# Patient Record
Sex: Male | Born: 1960 | Race: White | Hispanic: No | Marital: Married | Smoking: Former smoker
Health system: Southern US, Community
[De-identification: ages and names within clinical notes are randomized; demographics above are authoritative.]

## PROBLEM LIST (undated history)

## (undated) DIAGNOSIS — Z794 Long term (current) use of insulin: Principal | ICD-10-CM

## (undated) DIAGNOSIS — E1165 Type 2 diabetes mellitus with hyperglycemia: Principal | ICD-10-CM

## (undated) DIAGNOSIS — G4730 Sleep apnea, unspecified: Secondary | ICD-10-CM

## (undated) DIAGNOSIS — I10 Essential (primary) hypertension: Secondary | ICD-10-CM

## (undated) DIAGNOSIS — Z79899 Other long term (current) drug therapy: Secondary | ICD-10-CM

## (undated) DIAGNOSIS — E119 Type 2 diabetes mellitus without complications: Secondary | ICD-10-CM

## (undated) DIAGNOSIS — E785 Hyperlipidemia, unspecified: Secondary | ICD-10-CM

## (undated) DIAGNOSIS — Z951 Presence of aortocoronary bypass graft: Secondary | ICD-10-CM

## (undated) HISTORY — DX: Presence of aortocoronary bypass graft: Z95.1

## (undated) HISTORY — DX: Hyperlipidemia, unspecified: E78.5

## (undated) HISTORY — DX: Essential (primary) hypertension: I10

## (undated) HISTORY — DX: Other long term (current) drug therapy: Z79.899

## (undated) HISTORY — DX: Type 2 diabetes mellitus with hyperglycemia: E11.65

## (undated) HISTORY — DX: Long term (current) use of insulin: Z79.4

## (undated) HISTORY — DX: Sleep apnea, unspecified: G47.30

---

## 2002-10-06 HISTORY — PX: TONSILECTOMY/ADENOIDECTOMY WITH MYRINGOTOMY: SHX6125

## 2017-11-22 DIAGNOSIS — M545 Low back pain: Secondary | ICD-10-CM | POA: Diagnosis not present

## 2017-12-18 DIAGNOSIS — R21 Rash and other nonspecific skin eruption: Secondary | ICD-10-CM | POA: Diagnosis not present

## 2018-06-07 DIAGNOSIS — M5441 Lumbago with sciatica, right side: Secondary | ICD-10-CM | POA: Diagnosis not present

## 2018-06-13 ENCOUNTER — Other Ambulatory Visit: Payer: Self-pay

## 2018-06-13 ENCOUNTER — Emergency Department (HOSPITAL_COMMUNITY)
Admission: EM | Admit: 2018-06-13 | Discharge: 2018-06-13 | Disposition: A | Discharge status: Home / Self Care | Payer: BLUE CROSS/BLUE SHIELD | Attending: Emergency Medicine | Admitting: Emergency Medicine

## 2018-06-13 ENCOUNTER — Emergency Department (EMERGENCY_DEPARTMENT_HOSPITAL): Payer: BLUE CROSS/BLUE SHIELD

## 2018-06-13 ENCOUNTER — Emergency Department (HOSPITAL_COMMUNITY): Payer: BLUE CROSS/BLUE SHIELD

## 2018-06-13 ENCOUNTER — Encounter (HOSPITAL_COMMUNITY): Payer: Self-pay | Admitting: *Deleted

## 2018-06-13 DIAGNOSIS — F17210 Nicotine dependence, cigarettes, uncomplicated: Secondary | ICD-10-CM | POA: Insufficient documentation

## 2018-06-13 DIAGNOSIS — E1165 Type 2 diabetes mellitus with hyperglycemia: Secondary | ICD-10-CM | POA: Insufficient documentation

## 2018-06-13 DIAGNOSIS — M79604 Pain in right leg: Secondary | ICD-10-CM

## 2018-06-13 DIAGNOSIS — M5430 Sciatica, unspecified side: Secondary | ICD-10-CM | POA: Diagnosis not present

## 2018-06-13 DIAGNOSIS — R252 Cramp and spasm: Secondary | ICD-10-CM | POA: Diagnosis not present

## 2018-06-13 DIAGNOSIS — M5431 Sciatica, right side: Secondary | ICD-10-CM | POA: Diagnosis not present

## 2018-06-13 DIAGNOSIS — R739 Hyperglycemia, unspecified: Secondary | ICD-10-CM

## 2018-06-13 HISTORY — DX: Type 2 diabetes mellitus without complications: E11.9

## 2018-06-13 LAB — CBC
HEMATOCRIT: 47.6 % (ref 39.0–52.0)
HEMOGLOBIN: 16.3 g/dL (ref 13.0–17.0)
MCH: 29.5 pg (ref 26.0–34.0)
MCHC: 34.2 g/dL (ref 30.0–36.0)
MCV: 86.1 fL (ref 78.0–100.0)
Platelets: 237 10*3/uL (ref 150–400)
RBC: 5.53 MIL/uL (ref 4.22–5.81)
RDW: 11.8 % (ref 11.5–15.5)
WBC: 9 10*3/uL (ref 4.0–10.5)

## 2018-06-13 LAB — BASIC METABOLIC PANEL
ANION GAP: 15 (ref 5–15)
BUN: 21 mg/dL — ABNORMAL HIGH (ref 6–20)
CHLORIDE: 101 mmol/L (ref 98–111)
CO2: 23 mmol/L (ref 22–32)
Calcium: 9.3 mg/dL (ref 8.9–10.3)
Creatinine, Ser: 0.79 mg/dL (ref 0.61–1.24)
GFR calc non Af Amer: >60 mL/min (ref >60)
Glucose, Bld: 312 mg/dL — ABNORMAL HIGH (ref 70–99)
POTASSIUM: 3.9 mmol/L (ref 3.5–5.1)
Sodium: 139 mmol/L (ref 135–145)

## 2018-06-13 LAB — MAGNESIUM: Magnesium: 2 mg/dL (ref 1.7–2.4)

## 2018-06-13 LAB — PROTIME-INR
INR: 1
Prothrombin Time: 13.1 s (ref 11.4–15.2)

## 2018-06-13 LAB — TROPONIN I: Troponin I: <0.03 ng/mL

## 2018-06-13 MED ORDER — LORAZEPAM 2 MG/ML IJ SOLN
1.0000 mg | Freq: Once | INTRAMUSCULAR | Status: AC
  Administered 2018-06-13: 1 mg via INTRAVENOUS
  Filled 2018-06-13: qty 1

## 2018-06-13 MED ORDER — DEXAMETHASONE SODIUM PHOSPHATE 10 MG/ML IJ SOLN
10.0000 mg | Freq: Once | INTRAMUSCULAR | Status: AC
  Administered 2018-06-13: 10 mg via INTRAVENOUS
  Filled 2018-06-13: qty 1

## 2018-06-13 MED ORDER — METFORMIN HCL 500 MG PO TABS: 500.0000 mg | ORAL_TABLET | Freq: Two times a day (BID) | ORAL | 0 refills | Status: DC

## 2018-06-13 MED ORDER — ONDANSETRON HCL 4 MG/2ML IJ SOLN
4.0000 mg | Freq: Once | INTRAMUSCULAR | Status: AC
  Administered 2018-06-13: 4 mg via INTRAVENOUS
  Filled 2018-06-13: qty 2

## 2018-06-13 MED ORDER — PREDNISONE 10 MG (21) PO TBPK: 10.0000 mg | ORAL_TABLET | Freq: Every day | ORAL | 0 refills | Status: DC

## 2018-06-13 MED ORDER — MORPHINE SULFATE (PF) 4 MG/ML IV SOLN
4.0000 mg | Freq: Once | INTRAVENOUS | Status: AC
  Administered 2018-06-13: 4 mg via INTRAVENOUS
  Filled 2018-06-13: qty 1

## 2018-06-13 MED ORDER — KETOROLAC TROMETHAMINE 30 MG/ML IJ SOLN
30.0000 mg | Freq: Once | INTRAMUSCULAR | Status: AC
  Administered 2018-06-13: 30 mg via INTRAVENOUS
  Filled 2018-06-13: qty 1

## 2018-06-13 MED ORDER — DIAZEPAM 5 MG PO TABS: 5.0000 mg | ORAL_TABLET | Freq: Two times a day (BID) | ORAL | 0 refills | Status: DC

## 2018-06-13 MED ORDER — HYDROCODONE-ACETAMINOPHEN 5-325 MG PO TABS: 1.0000 | ORAL_TABLET | ORAL | 0 refills | Status: DC | PRN

## 2018-06-13 NOTE — ED Notes (Signed)
Family sitting in room, Pt. In Vascular Lab.

## 2018-06-13 NOTE — ED Notes (Signed)
ED Provider at bedside. 

## 2018-06-13 NOTE — ED Triage Notes (Signed)
The pt is c/o a severe rt leg cramp  In his rt leg for 2 days  More severe since 0200am  The pain goes into his lt chest

## 2018-06-13 NOTE — ED Provider Notes (Signed)
[facility] EMERGENCY DEPARTMENT Provider Note   CSN: [id number] Arrival date & time: 06/13/18  4163     History   Chief Complaint Chief Complaint  Patient presents with  . Leg Pain    HPI Barry Schmidt is a 57 y.o. male.  Pt presents to the ED today with right leg pain.  He has pain to his right leg for the past week.  He went to urgent care and was diagnosed with sciatica.  He was given muscle relaxants (flexeril, then baclofen), nsaids, and steroids.  The pt said sx are getting worse.  The pt was awake all night with cramping to his leg.  He was walking around and felt pain to his chest.  He denies any cp or sob.     Past Medical History:  Diagnosis Date  . Diabetes mellitus without complication (HCC)     There are no active problems to display for this patient.   History reviewed. No pertinent surgical history.      Home Medications    Prior to Admission medications   Medication Sig Start Date End Date Taking? Authorizing Provider  diazepam (VALIUM) 5 MG tablet Take 1 tablet (5 mg total) by mouth 2 (two) times daily. 06/13/18   Jacalyn Lefevre, MD  HYDROcodone-acetaminophen (NORCO/VICODIN) 5-325 MG tablet Take 1 tablet by mouth every 4 (four) hours as needed. 06/13/18   Jacalyn Lefevre, MD  metFORMIN (GLUCOPHAGE) 500 MG tablet Take 1 tablet (500 mg total) by mouth 2 (two) times daily with a meal. 06/13/18   Jacalyn Lefevre, MD  predniSONE (STERAPRED UNI-PAK 21 TAB) 10 MG (21) TBPK tablet Take 1 tablet (10 mg total) by mouth daily. Take 6 tabs by mouth daily  for 2 days, then 5 tabs for 2 days, then 4 tabs for 2 days, then 3 tabs for 2 days, 2 tabs for 2 days, then 1 tab by mouth daily for 2 days 06/13/18   Jacalyn Lefevre, MD    Family History No family history on file.  Social History Social History   Tobacco Use  . Smoking status: Current Every Day Smoker  . Smokeless tobacco: Never Used  Substance Use Topics  . Alcohol use: Yes  . Drug use:  Not on file     Allergies   Penicillins   Review of Systems Review of Systems  Cardiovascular: Positive for chest pain.  Musculoskeletal:       Right leg cramping  All other systems reviewed and are negative.    Physical Exam Updated Vital Signs BP 121/80   Pulse 64   Resp 15   Ht 5' 8.5" (1.74 m)   Wt 99.8 kg   SpO2 97%   BMI 32.96 kg/m   Physical Exam  Constitutional: He is oriented to person, place, and time. He appears well-developed and well-nourished.  HENT:  Head: Normocephalic and atraumatic.  Right Ear: External ear normal.  Left Ear: External ear normal.  Nose: Nose normal.  Mouth/Throat: Oropharynx is clear and moist.  Eyes: Pupils are equal, round, and reactive to light. Conjunctivae and EOM are normal.  Neck: Normal range of motion. Neck supple.  Cardiovascular: Normal rate, regular rhythm, normal heart sounds and intact distal pulses.  Pulmonary/Chest: Effort normal and breath sounds normal.  Abdominal: Soft. Bowel sounds are normal.  Musculoskeletal: Normal range of motion.  Neurological: He is alert and oriented to person, place, and time.  Skin: Skin is warm. Capillary refill takes less than 2 seconds.  Psychiatric: He has a normal mood and affect. His behavior is normal. Judgment and thought content normal.  Nursing note and vitals reviewed.    ED Treatments / Results  Labs (all labs ordered are listed, but only abnormal results are displayed) Labs Reviewed  BASIC METABOLIC PANEL - Abnormal; Notable for the following components:      Result Value   Glucose, Bld 312 (*)    BUN 21 (*)    All other components within normal limits  CBC  TROPONIN I  PROTIME-INR  MAGNESIUM    EKG EKG Interpretation  Date/Time:  "Sunday June 13 2018 06:50:10 EDT Ventricular Rate:  75 PR Interval:  138 QRS Duration: 90 QT Interval:  414 QTC Calculation: 462 R Axis:   24 Text Interpretation:  Normal sinus rhythm Normal ECG No old tracing to  compare Confirmed by Isidra Mings ([id number]) on 06/13/2018 7:02:24 AM   Radiology Dg Chest 2 View  Result Date: 06/13/2018 CLINICAL DATA:  Leg cramps. EXAM: CHEST - 2 VIEW COMPARISON:  None. FINDINGS: The heart, hila, mediastinum, lungs, and pleura are normal. No acute abnormalities identified. IMPRESSION: No active cardiopulmonary disease. Electronically Signed   By: David  Williams III M.D   On: 06/13/2018 07:23    Right lower extremity venous duplex completed.    Preliminary report:  There is no DVT or SVT noted in the right lower extremity.    Procedures Procedures (including critical care time)  Medications Ordered in ED Medications  LORazepam (ATIVAN) injection 1 mg (has no administration in time range)  morphine 4 MG/ML injection 4 mg (has no administration in time range)  ketorolac (TORADOL) 30 MG/ML injection 30 mg (has no administration in time range)  morphine 4 MG/ML injection 4 mg (4 mg Intravenous Given 06/13/18 0809)  ondansetron (ZOFRAN) injection 4 mg (4 mg Intravenous Given 06/13/18 0809)  dexamethasone (DECADRON) injection 10 mg (10 mg Intravenous Given 06/13/18 0809)  LORazepam (ATIVAN) injection 1 mg (1 mg Intravenous Given 06/13/18 0809)     Initial Impression / Assessment and Plan / ED Course  I have reviewed the triage vital signs and the nursing notes.  Pertinent labs & imaging results that were available during my care of the patient were reviewed by me and considered in my medical decision making (see chart for details).    Sx c/w sciatica.  No DVT.   Nl ekg and neg troponin.  Very atypical cp.  Heart score of 2.  Pain has improved. BS is elevated.  He has not taken any diabetic meds for 6 years since he lost weight.  He has not had his blood sugar checked recently.  It is probably elevated b/c he's been on steroids.  I will put him back on metformin while he's on steroids.  He is given the number of pcp where he lives.  Return if worse.  Pt stable for d/c  home.  Final Clinical Impressions(s) / ED Diagnoses   Final diagnoses:  Acute sciatica  Hyperglycemia    ED Discharge Orders         Ordered    HYDROcodone-acetaminophen (NORCO/VICODIN) 5-325 MG tablet  Every 4 hours PRN     06/13/18 1035    metFORMIN (GLUCOPHAGE) 500 MG tablet  2 times daily with meals     06/13/18 1035    predniSONE (STERAPRED UNI-PAK 21 TAB) 10 MG (21) TBPK tablet  Daily     09" /08/19 1035    diazepam (VALIUM) 5 MG tablet  2 times  daily     06/13/18 1035           Jacalyn Lefevre, MD 06/13/18 1037

## 2018-06-13 NOTE — Progress Notes (Signed)
VASCULAR LAB PRELIMINARY  PRELIMINARY  PRELIMINARY  PRELIMINARY  Right lower extremity venous duplex completed.    Preliminary report:  There is no DVT or SVT noted in the right lower extremity.    Called results to Dr. Trude Mcburney, [facility], RVT 06/13/2018, 9:19 AM

## 2018-06-13 NOTE — Discharge Instructions (Signed)
Stop current meds for pain.

## 2018-06-13 NOTE — ED Notes (Signed)
Patient transported to X-ray 

## 2018-06-23 ENCOUNTER — Telehealth: Payer: Self-pay | Admitting: Emergency Medicine

## 2018-06-23 ENCOUNTER — Other Ambulatory Visit: Payer: Self-pay

## 2018-06-23 ENCOUNTER — Ambulatory Visit (INDEPENDENT_AMBULATORY_CARE_PROVIDER_SITE_OTHER): Payer: BLUE CROSS/BLUE SHIELD | Admitting: Family Medicine

## 2018-06-23 ENCOUNTER — Encounter: Payer: Self-pay | Admitting: Family Medicine

## 2018-06-23 VITALS — BP 130/70 | HR 71 | Temp 98.0°F | Ht 68.5 in | Wt 193.8 lb

## 2018-06-23 DIAGNOSIS — E1165 Type 2 diabetes mellitus with hyperglycemia: Secondary | ICD-10-CM | POA: Diagnosis not present

## 2018-06-23 DIAGNOSIS — Z23 Encounter for immunization: Secondary | ICD-10-CM

## 2018-06-23 DIAGNOSIS — IMO0001 Reserved for inherently not codable concepts without codable children: Secondary | ICD-10-CM

## 2018-06-23 DIAGNOSIS — E1144 Type 2 diabetes mellitus with diabetic amyotrophy: Secondary | ICD-10-CM | POA: Diagnosis not present

## 2018-06-23 DIAGNOSIS — Z794 Long term (current) use of insulin: Secondary | ICD-10-CM

## 2018-06-23 DIAGNOSIS — R739 Hyperglycemia, unspecified: Secondary | ICD-10-CM

## 2018-06-23 DIAGNOSIS — M5416 Radiculopathy, lumbar region: Secondary | ICD-10-CM | POA: Diagnosis not present

## 2018-06-23 HISTORY — DX: Reserved for inherently not codable concepts without codable children: IMO0001

## 2018-06-23 LAB — POCT GLYCOSYLATED HEMOGLOBIN (HGB A1C): Hemoglobin A1C: 12.1 % — AB (ref 4.0–5.6)

## 2018-06-23 LAB — COMPREHENSIVE METABOLIC PANEL
ALK PHOS: 100 U/L (ref 39–117)
ALT: 35 U/L (ref 0–53)
AST: 15 U/L (ref 0–37)
Albumin: 4.1 g/dL (ref 3.5–5.2)
BUN: 20 mg/dL (ref 6–23)
CO2: 28 meq/L (ref 19–32)
Calcium: 9.3 mg/dL (ref 8.4–10.5)
Chloride: 97 mEq/L (ref 96–112)
Creatinine, Ser: 0.77 mg/dL (ref 0.40–1.50)
GFR: 111.25 mL/min (ref >60.00)
GLUCOSE: 532 mg/dL — AB (ref 70–99)
Potassium: 4.4 mEq/L (ref 3.5–5.1)
SODIUM: 132 meq/L — AB (ref 135–145)
TOTAL PROTEIN: 6.5 g/dL (ref 6.0–8.3)
Total Bilirubin: 0.5 mg/dL (ref 0.2–1.2)

## 2018-06-23 LAB — LIPID PANEL
Cholesterol: 185 mg/dL (ref 0–200)
HDL: 60.9 mg/dL
Total CHOL/HDL Ratio: 3
Triglycerides: 406 mg/dL — ABNORMAL HIGH (ref 0.0–149.0)

## 2018-06-23 LAB — LDL CHOLESTEROL, DIRECT: LDL DIRECT: 90 mg/dL

## 2018-06-23 LAB — MICROALBUMIN / CREATININE URINE RATIO
Creatinine,U: 21.1 mg/dL
Microalb Creat Ratio: 3.3 mg/g (ref 0.0–30.0)
Microalb, Ur: <0.7 mg/dL (ref 0.0–1.9)

## 2018-06-23 LAB — TSH: TSH: 2.82 u[IU]/mL (ref 0.35–4.50)

## 2018-06-23 MED ORDER — BLOOD GLUCOSE METER KIT: PACK | 0 refills | Status: DC

## 2018-06-23 MED ORDER — METFORMIN HCL 1000 MG PO TABS: 1000.0000 mg | ORAL_TABLET | Freq: Two times a day (BID) | ORAL | 3 refills | Status: DC

## 2018-06-23 MED ORDER — ONETOUCH ULTRA 2 W/DEVICE KIT: 1.0000 | PACK | Freq: Two times a day (BID) | 0 refills | Status: AC

## 2018-06-23 MED ORDER — GABAPENTIN 300 MG PO CAPS: 300.0000 mg | ORAL_CAPSULE | Freq: Three times a day (TID) | ORAL | 0 refills | Status: DC

## 2018-06-23 MED ORDER — GLUCOSE BLOOD VI STRP: ORAL_STRIP | 12 refills | Status: DC

## 2018-06-23 MED ORDER — ONETOUCH ULTRASOFT LANCETS MISC: 12 refills | Status: AC

## 2018-06-23 NOTE — Telephone Encounter (Signed)
Patient just established care with Dr. Mardelle MatteAndy today with A1C noted to be > 12. Metformin doubled to 1000 mg BID. He has just completed a steroid taper given by ER which is likely cause of such high elevation of sugar currently. Care as noted below with ER precautions reviewed. He is to call tomorrow AM to report fasting blood sugar and for further instruction.

## 2018-06-23 NOTE — Progress Notes (Signed)
Subjective  CC:  Chief Complaint  Patient presents with  . Establish Care    Not seen a PCP in 5 years, relocated here from [place]  . Leg Pain    Right leg pain, doing down to the ankle x 2 weeks    HPI: Barry Schmidt is a 57 y.o. male who presents to [place] at [facility] today to establish care with me as a new patient.  Recently evaluated at [facility] and ED for "sciatica". I reviewed ER notes and labs He has the following concerns or needs:  Left anterior thigh pain for the last several weeks.  He has been hydrocodone, Valium, prednisone and a muscle relaxer.  His symptoms are mildly improved but not significantly so.  Pain is described as burning but sometimes radiating down the ankle.  He denies weakness.  He denies buttock pain or back pain.  He has history of sciatica but this does feel different.  He does not feel that the prednisone helped.  Health maintenance, overdue.  Last time evaluated by a primary care doctor was over 5 years ago.  He does report that at that time he was taken off his diabetes medicines because his diabetes is been well controlled.  He had lost close to 100 pounds.  He has not been back to be rechecked.  He specifically denies symptoms of hyperglycemia including blurred vision, polyuria, polydipsia although he will have nocturia if he drinks too much at night.  He avoids sweet beverages but does admit to liking desserts and sweets.  He is not following a diabetic diet.  His weight is stable.   Assessment  1. Uncontrolled diabetes mellitus without complication, with long-term current use of insulin ([place])   2. Hyperglycemia   3. Left lumbar radiculopathy   4. 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 Need for influenza vaccination      Plan   Uncontrolled diabetes type 2: Patient prefers to work on diet.  He would like to try metformin 1000 twice daily only.  Will start checking sugars twice daily, follow low sugar, diabetic  diet and recheck in 3 to 4 weeks.  Flu shot updated today.  Need old records to see if he is had his Pneumovax.  Will check labs and urine testing today.  Will need eye exam.  Start education regarding uncontrolled diabetes and goals for control.  See after visit summary  Anterior thigh pain could be consistent with a myotrophy neuropathy due to diabetes.  Also could be due to lumbar radiculopathy.  He does have an appointment with orthopedic this Friday.  I recommend keeping that.  Start Neurontin.  May warrant lumbar MRI.  Health maintenance: We will need to get diabetes under better control, then will work on other preventive screens that are due.  Patient understands and agrees with current care plan  Follow up:  Return in about 3 months (around 09/22/2018). Orders Placed This Encounter  Procedures  . Flu Vaccine QUAD 36+ mos IM  . Comprehensive metabolic panel  . Lipid panel  . TSH  . Microalbumin / creatinine urine ratio  . LDL cholesterol, direct  . POCT glycosylated hemoglobin (Hb A1C)   Meds ordered this encounter  Medications  . gabapentin (NEURONTIN) 300 MG capsule    Sig: Take 1 capsule (300 mg total) by mouth 3 (three) times daily.    Dispense:  90 capsule    Refill:  0  . metFORMIN (GLUCOPHAGE)  1000 MG tablet    Sig: Take 1 tablet (1,000 mg total) by mouth 2 (two) times daily with a meal.    Dispense:  180 tablet    Refill:  3  . DISCONTD: blood glucose meter kit and supplies    Sig: Dispense based on patient and insurance preference. Use up to twice times daily as directed. (FOR ICD-10 E10.9, E11.9).    Dispense:  1 each    Refill:  0    Order Specific Question:   Number of strips    Answer:   100    Order Specific Question:   Number of lancets    Answer:   100  . DISCONTD: blood glucose meter kit and supplies    Sig: Dispense based on patient and insurance preference. Use up to four times daily as directed. (FOR ICD-10 E10.9, E11.9).    Dispense:  1 each     Refill:  0    Order Specific Question:   Number of strips    Answer:   100    Order Specific Question:   Number of lancets    Answer:   100  . Blood Glucose Monitoring Suppl (ONE TOUCH ULTRA 2) w/Device KIT    Sig: 1 kit by Does not apply route 2 (two) times daily.    Dispense:  1 each    Refill:  0  . Lancets (ONETOUCH ULTRASOFT) lancets    Sig: Use as instructed    Dispense:  100 each    Refill:  12  . glucose blood (ONE TOUCH ULTRA TEST) test strip    Sig: Use as instructed    Dispense:  100 each    Refill:  12     Depression screen PHQ 2/9 06/23/2018  Decreased Interest 0  Down, Depressed, Hopeless 0  PHQ - 2 Score 0    We updated and reviewed the patient's past history in detail and it is documented below.  Patient Active Problem List   Diagnosis Date Noted  . Uncontrolled diabetes mellitus without complication, with long-term current use of insulin ([place]) 06/23/2018    Diagnosed in 2007; was morbidly obese at that time 260 pounds. He lost weight and did well.     Health Maintenance  Topic Date Due  . Hepatitis C Screening  Apr 09, 1961  . PNEUMOCOCCAL POLYSACCHARIDE VACCINE AGE 68-64 HIGH RISK  11/24/1964  . OPHTHALMOLOGY EXAM  11/24/1972  . HIV Screening  11/24/1977  . INFLUENZA VACCINE  05/06/2018  . HEMOGLOBIN A1C  12/22/2018  . FOOT EXAM  06/24/2019  . URINE MICROALBUMIN  06/24/2019  . COLONOSCOPY  10/06/2022  . TETANUS/TDAP  10/06/2022   Immunization History  Administered Date(s) Administered  . Tdap 10/06/2012   Current Meds  Medication Sig  . predniSONE (STERAPRED UNI-PAK 21 TAB) 10 MG (21) TBPK tablet Take 1 tablet (10 mg total) by mouth daily. Take 6 tabs by mouth daily  for 2 days, then 5 tabs for 2 days, then 4 tabs for 2 days, then 3 tabs for 2 days, 2 tabs for 2 days, then 1 tab by mouth daily for 2 days  . [DISCONTINUED] metFORMIN (GLUCOPHAGE) 500 MG tablet Take 1 tablet (500 mg total) by mouth 2 (two) times daily with a meal.     Allergies: Patient is allergic to penicillins. Past Medical History Patient  has a past medical history of Diabetes mellitus without complication ([place]) and Uncontrolled diabetes mellitus without complication, with long-term current use of insulin ([place]) (06/23/2018).  Past Surgical History Patient  has no past surgical history on file. Family History: Patient family history includes Diabetes in his brother, brother, and sister; Diabetes type I in his father; Healthy in his son and son; Heart attack in his father; Heart disease in his mother; Hypertension in his mother. Social History:  Patient  reports that he has been smoking cigarettes. He has never used smokeless tobacco. He reports that he drinks alcohol. He reports that he does not use drugs.  Review of Systems: Constitutional: negative for fever or malaise Ophthalmic: negative for photophobia, double vision or loss of vision Cardiovascular: negative for chest pain, dyspnea on exertion, or new LE swelling Respiratory: negative for SOB or persistent cough Gastrointestinal: negative for abdominal pain, change in bowel habits or melena Genitourinary: negative for dysuria or gross hematuria Musculoskeletal: negative for new gait disturbance or muscular weakness Integumentary: negative for new or persistent rashes Neurological: negative for TIA or stroke symptoms Psychiatric: negative for SI or delusions Allergic/Immunologic: negative for hives  Patient Care Team    Relationship Specialty Notifications Start End  [name], MD PCP - General Family Medicine  06/23/18     Objective  Vitals: BP 130/70   Pulse 71   Temp 98 F (36.7 C)   Ht 5' 8.5" (1.74 m)   Wt 193 lb 12.8 oz (87.9 kg)   SpO2 98%   BMI 29.04 kg/m  General:  Well developed, well nourished, no acute distress  Psych:  Alert and oriented,normal mood and affect HEENT:  Normocephalic, atraumatic, non-icteric sclera, PERRL, oropharynx is without mass or exudate,  supple neck without adenopathy, mass or thyromegaly Cardiovascular:  RRR without gallop, rub or murmur, nondisplaced PMI Respiratory:  Good breath sounds bilaterally, CTAB with normal respiratory effort Gastrointestinal: normal bowel sounds, soft, non-tender, no noted masses. No HSM MSK: no deformities, contusions. Joints are without erythema or swelling No sciatic notch tenderness, normal gait, no back tenderness Skin:  Warm, no rashes or suspicious lesions noted Neurologic:    Mental status is normal.  Negative straight leg raise bilaterally, right knee DTR, left knee DTR +2, normal strength throughout.  Normal gait   Commons side effects, risks, benefits, and alternatives for medications and treatment plan prescribed today were discussed, and the patient expressed understanding of the given instructions. Patient is instructed to call or message via MyChart if he/she has any questions or concerns regarding our treatment plan. No barriers to understanding were identified. We discussed Red Flag symptoms and signs in detail. Patient expressed understanding regarding what to do in case of urgent or emergency type symptoms.   Medication list was reconciled, printed and provided to the patient in AVS. Patient instructions and summary information was reviewed with the patient as documented in the AVS. This note was prepared with assistance of Dragon voice recognition software. Occasional wrong-word or sound-a-like substitutions may have occurred due to the inherent limitations of voice recognition software

## 2018-06-23 NOTE — Telephone Encounter (Addendum)
CRITICAL VALUE STICKER  CRITICAL VALUE: Glucose 532  RECEIVER (on-site recipient of call): Kathi SimpersAmy Madalina Rosman, LPN   DATE & TIME NOTIFIED: 06/23/2018- 3:35pm  MESSENGER (representative from lab): [facility]   MD NOTIFIED: Malva Coganody Martin, PA-C   TIME OF NOTIFICATION:3:45pm  RESPONSE: Spoke with Malva Coganody Martin, states for Patient to check Glucose, I spoke with Patient and advised him to check Blood Glucose, he is at the pharmacy picking up supplies and he will check and call back at [phone].   Spoke with Patient, he reports that his sugar was 476, I advised patient per Malva Coganody Martin to Minimize Carbs, Push Fluids, Monitor BS throughout the night and if Blood Sugars are consitantly at 500. Go to ER for Fluids. Patient advised to call tomorrow morning with Fasting Blood Sugars.  Patient verbalized understanding  Kathi SimpersAmy Keiera Strathman,  LPN

## 2018-06-23 NOTE — Patient Instructions (Addendum)
Please return in 4 weeks to recheck diabetes . Please check your sugars every morning, fasting and then 2 hours AFTER lunch or dinner. (so check twice a day). Fasting sugar goal is < 120 2 hours after eating goal is < 160. We want your a1c < 7.0 Increase your metformin to 1056m daily.  Please start following a diabetic diet.   Please have the orthopedic doctor send me his notes.   It was a pleasure meeting you today! Thank you for choosing [facility] meet your healthcare needs! I truly look forward to working with you. If you have any questions or concerns, please send me a message via Mychart or call the office at [phone]   Diabetes Mellitus and Standards of Medical Care Managing diabetes (diabetes mellitus) can be complicated. Your diabetes treatment may be managed by a team of health care providers, including:  A diet and nutrition specialist (registered dietitian).  A nurse.  A certified diabetes educator (CDE).  A diabetes specialist (endocrinologist).  An eye doctor.  A primary care provider.  A dentist.  Your health care providers follow a schedule in order to help you get the best quality of care. The following schedule is a general guideline for your diabetes management plan. Your health care providers may also give you more specific instructions. HbA1c ( hemoglobin A1c) test This test provides information about blood sugar (glucose) control over the previous 2-3 months. It is used to check whether your diabetes management plan needs to be adjusted.  If you are meeting your treatment goals, this test is done at least 2 times a year.  If you are not meeting treatment goals or if your treatment goals have changed, this test is done 4 times a year.  Blood pressure test  This test is done at every routine medical visit. For most people, the goal is less than 130/80. Ask your health care provider what your goal blood pressure should be. Dental and eye exams  Visit  your dentist two times a year.  If you have type 1 diabetes, get an eye exam 3-5 years after you are diagnosed, and then once a year after your first exam. ? If you were diagnosed with type 1 diabetes as a child, get an eye exam when you are age 6455or older and have had diabetes for 3-5 years. After the first exam, you should get an eye exam once a year.  If you have type 2 diabetes, have an eye exam as soon as you are diagnosed, and then once a year after your first exam. Foot care exam  Visual foot exams are done at every routine medical visit. The exams check for cuts, bruises, redness, blisters, sores, or other problems with the feet.  A complete foot exam is done by your health care provider once a year. This exam includes an inspection of the structure and skin of your feet, and a check of the pulses and sensation in your feet. ? Type 1 diabetes: Get your first exam 3-5 years after diagnosis. ? Type 2 diabetes: Get your first exam as soon as you are diagnosed.  Check your feet every day for cuts, bruises, redness, blisters, or sores. If you have any of these or other problems that are not healing, contact your health care provider. Kidney function test ( urine microalbumin)  This test is done once a year. ? Type 1 diabetes: Get your first test 5 years after diagnosis. ? Type 2 diabetes: Get your  first test as soon as you are diagnosed.  If you have chronic kidney disease (CKD), get a serum creatinine and estimated glomerular filtration rate (eGFR) test once a year. Lipid profile (cholesterol, HDL, LDL, triglycerides)  This test should be done when you are diagnosed with diabetes, and every 5 years after the first test. If you are on medicines to lower your cholesterol, you may need to get this test done every year. ? The goal for LDL is less than 100 mg/dL (5.5 mmol/L). If you are at high risk, the goal is less than 70 mg/dL (3.9 mmol/L). ? The goal for HDL is 40 mg/dL (2.2 mmol/L)  for men and 50 mg/dL(2.8 mmol/L) for women. An HDL cholesterol of 60 mg/dL (3.3 mmol/L) or higher gives some protection against heart disease. ? The goal for triglycerides is less than 150 mg/dL (8.3 mmol/L). Immunizations  The yearly flu (influenza) vaccine is recommended for everyone 6 months or older who has diabetes.  The pneumonia (pneumococcal) vaccine is recommended for everyone 2 years or older who has diabetes. If you are 33 or older, you may get the pneumonia vaccine as a series of two separate shots.  The hepatitis B vaccine is recommended for adults shortly after they have been diagnosed with diabetes.  The Tdap (tetanus, diphtheria, and pertussis) vaccine should be given: ? According to normal childhood vaccination schedules, for children. ? Every 10 years, for adults who have diabetes.  The shingles vaccine is recommended for people who have had chicken pox and are 50 years or older. Mental and emotional health  Screening for symptoms of eating disorders, anxiety, and depression is recommended at the time of diagnosis and afterward as needed. If your screening shows that you have symptoms (you have a positive screening result), you may need further evaluation and be referred to a mental health care provider. Diabetes self-management education  Education about how to manage your diabetes is recommended at diagnosis and ongoing as needed. Treatment plan  Your treatment plan will be reviewed at every medical visit. Summary  Managing diabetes (diabetes mellitus) can be complicated. Your diabetes treatment may be managed by a team of health care providers.  Your health care providers follow a schedule in order to help you get the best quality of care.  Standards of care including having regular physical exams, blood tests, blood pressure monitoring, immunizations, screening tests, and education about how to manage your diabetes.  Your health care providers may also give you  more specific instructions based on your individual health. This information is not intended to replace advice given to you by your health care provider. Make sure you discuss any questions you have with your health care provider. Document Released: 07/20/2009 Document Revised: 06/20/2016 Document Reviewed: 06/20/2016 Elsevier Interactive Patient Education  2018 [employer].   Diabetes Mellitus and Nutrition When you have diabetes (diabetes mellitus), it is very important to have healthy eating habits because your blood sugar (glucose) levels are greatly affected by what you eat and drink. Eating healthy foods in the appropriate amounts, at about the same times every day, can help you:  Control your blood glucose.  Lower your risk of heart disease.  Improve your blood pressure.  Reach or maintain a healthy weight.  Every person with diabetes is different, and each person has different needs for a meal plan. Your health care provider may recommend that you work with a diet and nutrition specialist (dietitian) to make a meal plan that is best  for you. Your meal plan may vary depending on factors such as:  The calories you need.  The medicines you take.  Your weight.  Your blood glucose, blood pressure, and cholesterol levels.  Your activity level.  Other health conditions you have, such as heart or kidney disease.  How do carbohydrates affect me? Carbohydrates affect your blood glucose level more than any other type of food. Eating carbohydrates naturally increases the amount of glucose in your blood. Carbohydrate counting is a method for keeping track of how many carbohydrates you eat. Counting carbohydrates is important to keep your blood glucose at a healthy level, especially if you use insulin or take certain oral diabetes medicines. It is important to know how many carbohydrates you can safely have in each meal. This is different for every person. Your dietitian can help you  calculate how many carbohydrates you should have at each meal and for snack. Foods that contain carbohydrates include:  Bread, cereal, rice, pasta, and crackers.  Potatoes and corn.  Peas, beans, and lentils.  Milk and yogurt.  Fruit and juice.  Desserts, such as cakes, cookies, ice cream, and candy.  How does alcohol affect me? Alcohol can cause a sudden decrease in blood glucose (hypoglycemia), especially if you use insulin or take certain oral diabetes medicines. Hypoglycemia can be a life-threatening condition. Symptoms of hypoglycemia (sleepiness, dizziness, and confusion) are similar to symptoms of having too much alcohol. If your health care provider says that alcohol is safe for you, follow these guidelines:  Limit alcohol intake to no more than 1 drink per day for nonpregnant women and 2 drinks per day for men. One drink equals 12 oz of beer, 5 oz of wine, or 1 oz of hard liquor.  Do not drink on an empty stomach.  Keep yourself hydrated with water, diet soda, or unsweetened iced tea.  Keep in mind that regular soda, juice, and other mixers may contain a lot of sugar and must be counted as carbohydrates.  What are tips for following this plan? Reading food labels  Start by checking the serving size on the label. The amount of calories, carbohydrates, fats, and other nutrients listed on the label are based on one serving of the food. Many foods contain more than one serving per package.  Check the total grams (g) of carbohydrates in one serving. You can calculate the number of servings of carbohydrates in one serving by dividing the total carbohydrates by 15. For example, if a food has 30 g of total carbohydrates, it would be equal to 2 servings of carbohydrates.  Check the number of grams (g) of saturated and trans fats in one serving. Choose foods that have low or no amount of these fats.  Check the number of milligrams (mg) of sodium in one serving. Most people should  limit total sodium intake to less than 2,300 mg per day.  Always check the nutrition information of foods labeled as "low-fat" or "nonfat". These foods may be higher in added sugar or refined carbohydrates and should be avoided.  Talk to your dietitian to identify your daily goals for nutrients listed on the label. Shopping  Avoid buying canned, premade, or processed foods. These foods tend to be high in fat, sodium, and added sugar.  Shop around the outside edge of the grocery store. This includes fresh fruits and vegetables, bulk grains, fresh meats, and fresh dairy. Cooking  Use low-heat cooking methods, such as baking, instead of high-heat cooking methods like  deep frying.  Cook using healthy oils, such as olive, canola, or sunflower oil.  Avoid cooking with butter, cream, or high-fat meats. Meal planning  Eat meals and snacks regularly, preferably at the same times every day. Avoid going long periods of time without eating.  Eat foods high in fiber, such as fresh fruits, vegetables, beans, and whole grains. Talk to your dietitian about how many servings of carbohydrates you can eat at each meal.  Eat 4-6 ounces of lean protein each day, such as lean meat, chicken, fish, eggs, or tofu. 1 ounce is equal to 1 ounce of meat, chicken, or fish, 1 egg, or 1/4 cup of tofu.  Eat some foods each day that contain healthy fats, such as avocado, nuts, seeds, and fish. Lifestyle   Check your blood glucose regularly.  Exercise at least 30 minutes 5 or more days each week, or as told by your health care provider.  Take medicines as told by your health care provider.  Do not use any products that contain nicotine or tobacco, such as cigarettes and e-cigarettes. If you need help quitting, ask your health care provider.  Work with a Social worker or diabetes educator to identify strategies to manage stress and any emotional and social challenges. What are some questions to ask my health care  provider?  Do I need to meet with a diabetes educator?  Do I need to meet with a dietitian?  What number can I call if I have questions?  When are the best times to check my blood glucose? Where to find more information:  American Diabetes Association: diabetes.org/food-and-fitness/food  Academy of Nutrition and Dietetics: PokerClues.dk  [employer] of Diabetes and Digestive and Kidney Diseases (NIH): ContactWire.be Summary  A healthy meal plan will help you control your blood glucose and maintain a healthy lifestyle.  Working with a diet and nutrition specialist (dietitian) can help you make a meal plan that is best for you.  Keep in mind that carbohydrates and alcohol have immediate effects on your blood glucose levels. It is important to count carbohydrates and to use alcohol carefully. This information is not intended to replace advice given to you by your health care provider. Make sure you discuss any questions you have with your health care provider. Document Released: 06/19/2005 Document Revised: 10/27/2016 Document Reviewed: 10/27/2016 Elsevier Interactive Patient Education  [employer].

## 2018-06-24 ENCOUNTER — Encounter: Payer: Self-pay | Admitting: Family Medicine

## 2018-06-24 ENCOUNTER — Telehealth: Payer: Self-pay | Admitting: Emergency Medicine

## 2018-06-24 DIAGNOSIS — Z23 Encounter for immunization: Secondary | ICD-10-CM

## 2018-06-24 DIAGNOSIS — E1165 Type 2 diabetes mellitus with hyperglycemia: Secondary | ICD-10-CM | POA: Diagnosis not present

## 2018-06-24 NOTE — Progress Notes (Signed)
Please call patient: I have reviewed his/her lab results. Please ask patient to continue bid glucose checks as discussed in office while on the 1000mg  bid dosing of metformin. He needs to be strict with his dietary changes avoiding all sweets and lessening carbohydrates. Follow up as scheduled, but IF sugars are running >400, needs OV sooner to discuss adding second medication. Drink plenty of water. He will likely need more medication unless he is strict with a diabetic diet.

## 2018-06-24 NOTE — Telephone Encounter (Signed)
Patient called back with Blood Sugars   7:00pm- 06/1818- 376 8:30pm- 06/23/18- 310   Fasting 06/24/2018- 187  After Eating Breakfast 06/24/2018- 235

## 2018-06-25 ENCOUNTER — Telehealth: Payer: Self-pay | Admitting: Emergency Medicine

## 2018-06-25 DIAGNOSIS — E1165 Type 2 diabetes mellitus with hyperglycemia: Principal | ICD-10-CM

## 2018-06-25 DIAGNOSIS — IMO0001 Reserved for inherently not codable concepts without codable children: Secondary | ICD-10-CM

## 2018-06-25 DIAGNOSIS — Z794 Long term (current) use of insulin: Principal | ICD-10-CM

## 2018-06-25 DIAGNOSIS — M545 Low back pain: Secondary | ICD-10-CM | POA: Diagnosis not present

## 2018-06-25 NOTE — Telephone Encounter (Signed)
Patient stated that he will call Monday to schedule an appointment.   Kathi SimpersAmy Delbert Vu,  LPN

## 2018-06-25 NOTE — Telephone Encounter (Signed)
He has very uncontrolled diabetes. I recommend starting a second medication now.   Please schedule an office visit next week to discuss next step.   Please offer Diabetes and Nutrition referral for education regarding diet.   For now, recommend avoiding carbs and sugars: Breakfast options: greek yogurt, oatmeal, boiled eggs, [place] sausage etc Avoid pastas, starches, potatoes, breads for now.   Drink plenty of water. No junk food.  Should no longer be on prednisone.

## 2018-06-25 NOTE — Telephone Encounter (Signed)
I'd like to see him next week. Did he schedule an appt?

## 2018-06-25 NOTE — Telephone Encounter (Signed)
Mr. Barry Schmidt came by the office, I discussed all recommendations with the patient and he agreed to referral for diabetes nutrition class. He saw [facility], and has some injured disc in back. They will forward notes to Dr. Mardelle Schmidt.   I have placed referral for Diabetes Nutrition.   Barry SimpersAmy Braelen Sproule,  LPN

## 2018-06-25 NOTE — Telephone Encounter (Signed)
Copied from CRM [id number]. Topic: General - Other >> Jun 25, 2018  8:26 AM Percival SpanishKennedy, Cheryl W wrote:  Pt ask that Annika Selke call him back said he was having some issues and would not give any more information.    Patient called this morning. He states his sugar was 200 fasting this am.   He ate a bowl of Wheaties and his sugar is 399 2 hrs. After eating   He is concerned regarding the big jump in numbers. Please advise.   Kathi SimpersAmy Tressa Maldonado,  LPN

## 2018-06-25 NOTE — Telephone Encounter (Signed)
LM to RC  

## 2018-06-26 DIAGNOSIS — M545 Low back pain: Secondary | ICD-10-CM | POA: Diagnosis not present

## 2018-06-29 DIAGNOSIS — M25561 Pain in right knee: Secondary | ICD-10-CM | POA: Diagnosis not present

## 2018-06-29 DIAGNOSIS — M545 Low back pain: Secondary | ICD-10-CM | POA: Diagnosis not present

## 2018-07-06 DIAGNOSIS — M25551 Pain in right hip: Secondary | ICD-10-CM | POA: Diagnosis not present

## 2018-07-06 DIAGNOSIS — M545 Low back pain: Secondary | ICD-10-CM | POA: Diagnosis not present

## 2018-07-06 DIAGNOSIS — M25561 Pain in right knee: Secondary | ICD-10-CM | POA: Diagnosis not present

## 2018-07-14 DIAGNOSIS — M25561 Pain in right knee: Secondary | ICD-10-CM | POA: Diagnosis not present

## 2018-07-15 ENCOUNTER — Other Ambulatory Visit: Payer: Self-pay | Admitting: Family Medicine

## 2018-07-21 ENCOUNTER — Ambulatory Visit (INDEPENDENT_AMBULATORY_CARE_PROVIDER_SITE_OTHER): Payer: BLUE CROSS/BLUE SHIELD | Admitting: Family Medicine

## 2018-07-21 ENCOUNTER — Telehealth: Payer: Self-pay | Admitting: Emergency Medicine

## 2018-07-21 ENCOUNTER — Ambulatory Visit: Payer: BLUE CROSS/BLUE SHIELD | Admitting: Family Medicine

## 2018-07-21 ENCOUNTER — Encounter: Payer: Self-pay | Admitting: Family Medicine

## 2018-07-21 ENCOUNTER — Other Ambulatory Visit: Payer: Self-pay

## 2018-07-21 VITALS — BP 118/82 | HR 75 | Temp 97.8°F | Ht 68.5 in | Wt 184.4 lb

## 2018-07-21 DIAGNOSIS — Z794 Long term (current) use of insulin: Secondary | ICD-10-CM | POA: Diagnosis not present

## 2018-07-21 DIAGNOSIS — M545 Low back pain: Secondary | ICD-10-CM | POA: Diagnosis not present

## 2018-07-21 DIAGNOSIS — G5721 Lesion of femoral nerve, right lower limb: Secondary | ICD-10-CM | POA: Diagnosis not present

## 2018-07-21 DIAGNOSIS — M25561 Pain in right knee: Secondary | ICD-10-CM | POA: Diagnosis not present

## 2018-07-21 DIAGNOSIS — E1165 Type 2 diabetes mellitus with hyperglycemia: Secondary | ICD-10-CM | POA: Diagnosis not present

## 2018-07-21 DIAGNOSIS — IMO0001 Reserved for inherently not codable concepts without codable children: Secondary | ICD-10-CM

## 2018-07-21 DIAGNOSIS — G5711 Meralgia paresthetica, right lower limb: Secondary | ICD-10-CM | POA: Diagnosis not present

## 2018-07-21 LAB — POCT GLYCOSYLATED HEMOGLOBIN (HGB A1C): Hemoglobin A1C: 10.5 % — AB (ref 4.0–5.6)

## 2018-07-21 MED ORDER — CANAGLIFLOZIN 100 MG PO TABS: 100.0000 mg | ORAL_TABLET | Freq: Every day | ORAL | 11 refills | Status: DC

## 2018-07-21 MED ORDER — AMITRIPTYLINE HCL 25 MG PO TABS: 25.0000 mg | ORAL_TABLET | Freq: Every day | ORAL | 2 refills | Status: DC

## 2018-07-21 NOTE — Progress Notes (Signed)
Subjective  CC:  Chief Complaint  Patient presents with  . Diabetes    doing well, has been doing well with diet, last a1c was 06/23/2018- 12.1    HPI: Barry Schmidt is a 57 y.o. male who presents to the office today for follow up of diabetes and problems listed above in the chief complaint.  Also here for follow-up of leg pain  Diabetes follow up: His diabetic control is reported as Improved.  He has been checking his sugars daily fasting state and 2 hours after dinner.  Averaging 130s fasting, 160s to 180s postprandially.  His diet is much improved.  Continue to be a picky eater.  He has lost weight, uncertain if he is eating enough calories. He denies exertional CP or SOB or symptomatic hypoglycemia. He denies foot sores.  He reports he had a Pneumovax 5 years ago.  He is not on an ACE inhibitor or is he had a statin.  Tolerating his metformin well.  Reviewed lab work including lipid panel and CMP.  See below.  Persistent right anterior thigh pain with numbness and weakness.  Continues to see orthopedics.  They are uncertain what is going on.  MRI of the lumbar spine was unrevealing.  Symptoms are more consistent with a localized femoral neuropathy or a myotrophy due to diabetes.  Education given.  He is on gabapentin.  He is having trouble sleeping due to nighttime pain.  He is on Percocet as needed no bowel or bladder problems.  Assessment  1. Uncontrolled diabetes mellitus without complication, with long-term current use of insulin (HCC)   2. Femoral neuropathy of right lower extremity      Plan   Diabetes is currently poorly controlled.  But it is improving.  Educated further on dietary recommendations.  He has seen a nutritionist in the past.  His wife is with him and is very supportive.  He had SGLT2 inhibitor.  Continue metformin.  Recheck 6 weeks with twice daily sugar log.  Check urine microalbuminuria today.  Add ACE if positive.  Consider statin in the near future.  Patient  like to defer further medication at this time due to ongoing issues with painful extremity  Right leg pain: Symptoms and clinical exam most consistent with femoral neuropathy due to diabetes.  Education given.  Neurontin, add Elavil for nighttime use, use Percocet as needed.  Check EMG and nerve conduction studies.  Consider physical therapy.  Follow up: Return in about 6 weeks (around 09/01/2018) for follow up Diabetes and leg pain.. Orders Placed This Encounter  Procedures  . POCT glycosylated hemoglobin (Hb A1C)  . NCV with EMG(electromyography)   Meds ordered this encounter  Medications  . amitriptyline (ELAVIL) 25 MG tablet    Sig: Take 1 tablet (25 mg total) by mouth at bedtime.    Dispense:  30 tablet    Refill:  2  . canagliflozin (INVOKANA) 100 MG TABS tablet    Sig: Take 1 tablet (100 mg total) by mouth daily before breakfast.    Dispense:  30 tablet    Refill:  11      Immunization History  Administered Date(s) Administered  . Influenza,inj,Quad PF,6+ Mos 06/24/2018  . Tdap 10/06/2012    Diabetes Related Lab Review: Lab Results  Component Value Date   HGBA1C 10.5 (A) 07/21/2018   HGBA1C 12.1 (A) 06/23/2018    Lab Results  Component Value Date   MICROALBUR <0.7 06/23/2018   Lab Results  Component Value Date  CREATININE 0.77 06/23/2018   BUN 20 06/23/2018   NA 132 (L) 06/23/2018   K 4.4 06/23/2018   CL 97 06/23/2018   CO2 28 06/23/2018   Lab Results  Component Value Date   CHOL 185 06/23/2018   Lab Results  Component Value Date   HDL 60.90 06/23/2018   No results found for: [facility] Lab Results  Component Value Date   TRIG (H) 06/23/2018    406.0 Triglyceride is over 400; calculations on Lipids are invalid.   Lab Results  Component Value Date   CHOLHDL 3 06/23/2018   Lab Results  Component Value Date   LDLDIRECT 90.0 06/23/2018   The 10-year ASCVD risk score Denman George DC Jr., et al., 2013) is: 13.1%   Values used to calculate the score:      Age: 52 years     Sex: Male     Is Non-Hispanic African American: No     Diabetic: Yes     Tobacco smoker: Yes     Systolic Blood Pressure: 118 mmHg     Is BP treated: No     HDL Cholesterol: 60.9 mg/dL     Total Cholesterol: 185 mg/dL I have reviewed the PMH, Fam and Soc history. Patient Active Problem List   Diagnosis Date Noted  . Uncontrolled diabetes mellitus without complication, with long-term current use of insulin (HCC) 06/23/2018    Diagnosed in 2007; was morbidly obese at that time 260 pounds. He lost weight and did well.      Social History: Patient  reports that he has been smoking cigarettes. He has never used smokeless tobacco. He reports that he drinks alcohol. He reports that he does not use drugs.  Review of Systems: Ophthalmic: negative for eye pain, loss of vision or double vision Cardiovascular: negative for chest pain Respiratory: negative for SOB or persistent cough Gastrointestinal: negative for abdominal pain Genitourinary: negative for dysuria or gross hematuria MSK: negative for foot lesions Neurologic: Positive for weakness or gait disturbance  Objective  Vitals: BP 118/82   Pulse 75   Temp 97.8 F (36.6 C)   Ht 5' 8.5" (1.74 m)   Wt 184 lb 6.4 oz (83.6 kg)   BMI 27.63 kg/m  General: well appearing, no acute distress  Psych:  Alert and oriented, normal mood and affect HEENT:  Normocephalic, atraumatic, moist mucous membranes, supple neck  Cardiovascular:  Nl S1 and S2, RRR without murmur, gallop or rub. no edema Respiratory:  Good breath sounds bilaterally, CTAB with normal effort, no rales Gastrointestinal: normal BS, soft, nontender Skin:  Warm, no rashes Neurologic:   Mental status is normal. Has decreased strength in psoas muscle and quad on right.  Decreased knee reflex on right.  Decreased sensation to anterior thigh. Foot exam: no erythema, pallor, or cyanosis visible nl proprioception and sensation to monofilament testing bilaterally,  +2 distal pulses bilaterally    Diabetic education: ongoing education regarding chronic disease management for diabetes was given today. We continue to reinforce the ABC's of diabetic management: A1c (<7 or 8 dependent upon patient), tight blood pressure control, and cholesterol management with goal LDL < 100 minimally. We discuss diet strategies, exercise recommendations, medication options and possible side effects. At each visit, we review recommended immunizations and preventive care recommendations for diabetics and stress that good diabetic control can prevent other problems. See below for this patient's data.    Commons side effects, risks, benefits, and alternatives for medications and treatment plan prescribed today were discussed,  and the patient expressed understanding of the given instructions. Patient is instructed to call or message via MyChart if he/she has any questions or concerns regarding our treatment plan. No barriers to understanding were identified. We discussed Red Flag symptoms and signs in detail. Patient expressed understanding regarding what to do in case of urgent or emergency type symptoms.   Medication list was reconciled, printed and provided to the patient in AVS. Patient instructions and summary information was reviewed with the patient as documented in the AVS. This note was prepared with assistance of Dragon voice recognition software. Occasional wrong-word or sound-a-like substitutions may have occurred due to the inherent limitations of voice recognition software

## 2018-07-21 NOTE — Telephone Encounter (Signed)
Copied from CRM [id number]. Topic: General - Other >> Jul 21, 2018  1:33 PM Ronney Lion A wrote: Reason for CRM: Pt called in wanting to see if there was an alternative to the medication canagliflozin (INVOKANA) 100 MG TABS tablet, says it is totaling $538. He would like something cheaper if possible. He says he even used the coupons and it's still an expensive amount.   Please advise.   Kathi Simpers,  LPN

## 2018-07-21 NOTE — Patient Instructions (Addendum)
Please return in 6 weeks for recheck.  Bring sugar log.  Start the medications ordered today as discussed.  Use your pain medication as needed.   We will call you with information regarding your referral appointment. EMG and Nerve testing.  If you do not hear from [name] within the next 2 weeks, please let me know. It can take 1-2 weeks to get appointments set up with the specialists.    If you have any questions or concerns, please don't hesitate to send me a message via MyChart or call the office at [phone]. Thank you for visiting with [name] today! It's our pleasure caring for you.   Diabetes Mellitus and Standards of Medical Care Managing diabetes (diabetes mellitus) can be complicated. Your diabetes treatment may be managed by a team of health care providers, including:  A diet and nutrition specialist (registered dietitian).  A nurse.  A certified diabetes educator (CDE).  A diabetes specialist (endocrinologist).  An eye doctor.  A primary care provider.  A dentist.  Your health care providers follow a schedule in order to help you get the best quality of care. The following schedule is a general guideline for your diabetes management plan. Your health care providers may also give you more specific instructions. HbA1c ( hemoglobin A1c) test This test provides information about blood sugar (glucose) control over the previous 2-3 months. It is used to check whether your diabetes management plan needs to be adjusted.  If you are meeting your treatment goals, this test is done at least 2 times a year.  If you are not meeting treatment goals or if your treatment goals have changed, this test is done 4 times a year.  Blood pressure test  This test is done at every routine medical visit. For most people, the goal is less than 130/80. Ask your health care provider what your goal blood pressure should be. Dental and eye exams  Visit your dentist two times a year.  If you have  type 1 diabetes, get an eye exam 3-5 years after you are diagnosed, and then once a year after your first exam. ? If you were diagnosed with type 1 diabetes as a child, get an eye exam when you are age 22 or older and have had diabetes for 3-5 years. After the first exam, you should get an eye exam once a year.  If you have type 2 diabetes, have an eye exam as soon as you are diagnosed, and then once a year after your first exam. Foot care exam  Visual foot exams are done at every routine medical visit. The exams check for cuts, bruises, redness, blisters, sores, or other problems with the feet.  A complete foot exam is done by your health care provider once a year. This exam includes an inspection of the structure and skin of your feet, and a check of the pulses and sensation in your feet. ? Type 1 diabetes: Get your first exam 3-5 years after diagnosis. ? Type 2 diabetes: Get your first exam as soon as you are diagnosed.  Check your feet every day for cuts, bruises, redness, blisters, or sores. If you have any of these or other problems that are not healing, contact your health care provider. Kidney function test ( urine microalbumin)  This test is done once a year. ? Type 1 diabetes: Get your first test 5 years after diagnosis. ? Type 2 diabetes: Get your first test as soon as you are diagnosed.  If you have chronic kidney disease (CKD), get a serum creatinine and estimated glomerular filtration rate (eGFR) test once a year. Lipid profile (cholesterol, HDL, LDL, triglycerides)  This test should be done when you are diagnosed with diabetes, and every 5 years after the first test. If you are on medicines to lower your cholesterol, you may need to get this test done every year. ? The goal for LDL is less than 100 mg/dL (5.5 mmol/L). If you are at high risk, the goal is less than 70 mg/dL (3.9 mmol/L). ? The goal for HDL is 40 mg/dL (2.2 mmol/L) for men and 50 mg/dL(2.8 mmol/L) for women. An  HDL cholesterol of 60 mg/dL (3.3 mmol/L) or higher gives some protection against heart disease. ? The goal for triglycerides is less than 150 mg/dL (8.3 mmol/L). Immunizations  The yearly flu (influenza) vaccine is recommended for everyone 6 months or older who has diabetes.  The pneumonia (pneumococcal) vaccine is recommended for everyone 2 years or older who has diabetes. If you are 76 or older, you may get the pneumonia vaccine as a series of two separate shots.  The hepatitis B vaccine is recommended for adults shortly after they have been diagnosed with diabetes.  The Tdap (tetanus, diphtheria, and pertussis) vaccine should be given: ? According to normal childhood vaccination schedules, for children. ? Every 10 years, for adults who have diabetes.  The shingles vaccine is recommended for people who have had chicken pox and are 50 years or older. Mental and emotional health  Screening for symptoms of eating disorders, anxiety, and depression is recommended at the time of diagnosis and afterward as needed. If your screening shows that you have symptoms (you have a positive screening result), you may need further evaluation and be referred to a mental health care provider. Diabetes self-management education  Education about how to manage your diabetes is recommended at diagnosis and ongoing as needed. Treatment plan  Your treatment plan will be reviewed at every medical visit. Summary  Managing diabetes (diabetes mellitus) can be complicated. Your diabetes treatment may be managed by a team of health care providers.  Your health care providers follow a schedule in order to help you get the best quality of care.  Standards of care including having regular physical exams, blood tests, blood pressure monitoring, immunizations, screening tests, and education about how to manage your diabetes.  Your health care providers may also give you more specific instructions based on your  individual health. This information is not intended to replace advice given to you by your health care provider. Make sure you discuss any questions you have with your health care provider. Document Released: 07/20/2009 Document Revised: 06/20/2016 Document Reviewed: 06/20/2016 Elsevier Interactive Patient Education  [employer].

## 2018-07-22 ENCOUNTER — Ambulatory Visit: Payer: BLUE CROSS/BLUE SHIELD | Admitting: Family Medicine

## 2018-07-22 ENCOUNTER — Telehealth: Payer: Self-pay | Admitting: Emergency Medicine

## 2018-07-22 DIAGNOSIS — G5721 Lesion of femoral nerve, right lower limb: Secondary | ICD-10-CM

## 2018-07-22 MED ORDER — DAPAGLIFLOZIN PROPANEDIOL 10 MG PO TABS: 10.0000 mg | ORAL_TABLET | Freq: Every day | ORAL | 11 refills | Status: DC

## 2018-07-22 NOTE — Telephone Encounter (Signed)
Please return to pick up free coupon for farxiga and new RX.  Then can check with pharmacist or insurance company to see which SGLT-2 inhibitor will be cheapest for him with savings coupon: farxiga or jardiance or steglatro.

## 2018-07-22 NOTE — Telephone Encounter (Signed)
Error

## 2018-07-22 NOTE — Addendum Note (Signed)
Addended by: Asencion Partridge on: 07/22/2018 08:03 AM   Modules accepted: Orders

## 2018-07-22 NOTE — Telephone Encounter (Signed)
Spoke with Patient, Rx placed at front office, advised patient to speak with Pharmacist or insurance to see which medication is cheaper for him.   Kathi Simpers,  LPN

## 2018-07-28 ENCOUNTER — Encounter: Payer: Self-pay | Admitting: Neurology

## 2018-07-29 ENCOUNTER — Other Ambulatory Visit: Payer: Self-pay | Admitting: *Deleted

## 2018-07-29 DIAGNOSIS — R200 Anesthesia of skin: Secondary | ICD-10-CM

## 2018-08-12 ENCOUNTER — Other Ambulatory Visit: Payer: Self-pay | Admitting: Family Medicine

## 2018-08-25 ENCOUNTER — Other Ambulatory Visit: Payer: Self-pay | Admitting: Family Medicine

## 2018-08-31 ENCOUNTER — Ambulatory Visit (INDEPENDENT_AMBULATORY_CARE_PROVIDER_SITE_OTHER): Payer: BLUE CROSS/BLUE SHIELD | Admitting: Neurology

## 2018-08-31 DIAGNOSIS — G5721 Lesion of femoral nerve, right lower limb: Secondary | ICD-10-CM

## 2018-08-31 DIAGNOSIS — R200 Anesthesia of skin: Secondary | ICD-10-CM | POA: Diagnosis not present

## 2018-08-31 NOTE — Procedures (Signed)
[facility]  [address], [address]  [place], [place] [address] Tel: [phone] Fax:  [phone] Test Date:  08/31/2018  Patient: Barry MantisGregory Schmidt DOB: 1961/05/25 Physician: Nita Sickleonika Kalan Yeley, DO  Sex: Male Height: 5\' 8"  Ref Phys: Asencion Partridgeamille Andy, MD  ID#: [id number] Temp: 31.0C Technician:    Patient Complaints: This is a 57 year old man with poorly controlled diabetes referred for evaluation of right leg weakness and pain.  NCV & EMG Findings: Extensive electrodiagnostic testing of the right lower extremity and additional studies of the left shows: 1. Bilateral sural and superficial peroneal sensory responses are within normal limits.  Bilateral saphenous sensory responses are absent, and can be a normal finding for patient's age. 2. Bilateral peroneal, tibial, and femoral motor responses are within normal limits. 3. Bilateral tibial H reflex studies are within normal limits.   4. 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  There is no evidence of a large fiber sensorimotor polyneuropathy or lumbosacral radiculopathy affecting the right lower extremity.     ___________________________ Nita Sickleonika Annai Heick, DO    Nerve Conduction Studies Anti Sensory Summary Table   Site NR Peak (ms) Norm Peak (ms) P-T Amp (V) Norm P-T Amp  Left Saphenous Anti Sensory (Ant Med Mall)  31C  14cm NR    >4  Right Saphenous Anti Sensory (Ant Med Mall)  31C  14cm NR    >4  Left Sup Peroneal Anti Sensory (Ant Lat Mall)  31C  12 cm    3.1 <4.6 11.9 >4  Right Sup Peroneal Anti Sensory (Ant Lat Mall)  31C  12 cm    3.8 <4.6 10.7 >4  Left Sural Anti Sensory (Lat Mall)  31C  Calf    4.6 <4.6 10.0 >4  Right Sural Anti Sensory (Lat Mall)  31C  Calf    3.9  <4.6 11.6 >4   Motor Summary Table   Site NR Onset (ms) Norm Onset (ms) O-P Amp (mV) Norm O-P Amp Site1 Site2 Delta-0 (ms) Dist (cm) Vel (m/s) Norm Vel (m/s)  Left Femoral Motor (Vastus Med)  31C  Abv Ing Lig    4.8 <6.5 5.2 >3        Below Ing Lig    4.9  5.0         Right Femoral Motor (Vastus Med)  31C  Abv Ing Lig    5.4 <6.5 5.1 >3        Below Ing Lig    5.4  4.3         Left Peroneal Motor (Ext Dig Brev)  31C  Ankle    5.1 <6.0 3.5 >2.5 B Fib Ankle 9.7 40.0 41 >40  B Fib    14.8  3.0  Poplt B Fib 1.7 9.0 53 >40  Poplt    16.5  2.6         Right Peroneal Motor (Ext Dig Brev)  31C  Ankle    4.9 <6.0 5.3 >2.5 B Fib Ankle 9.5 39.0 41 >40  B Fib    14.4  4.8  Poplt B Fib 1.5 9.0 60 >40  Poplt    15.9  4.6         Right Peroneal TA Motor (Tib Ant)  31C  Fib Head    3.4 <4.5 5.2 >3 Poplit Fib  Head 1.4 9.0 64 >40  Poplit    4.8  4.9         Left Tibial Motor (Abd Hall Brev)  31C  Ankle    5.5 <6.0 4.2 >4 Knee Ankle 10.1 40.0 40 >40  Knee    15.6  3.7         Right Tibial Motor (Abd Hall Brev)  31C  Ankle    4.6 <6.0 5.6 >4 Knee Ankle 10.6 43.0 41 >40  Knee    15.2  4.2          H Reflex Studies   NR H-Lat (ms) Lat Norm (ms) L-R H-Lat (ms)  Left Tibial (Gastroc)  31C     34.51 <35 0.00  Right Tibial (Gastroc)  31C     34.51 <35 0.00   EMG   Side Muscle Ins Act Fibs Psw Fasc Number Recrt Dur Dur. Amp Amp. Poly Poly. Comment  Right AntTibialis Nml Nml Nml Nml Nml Nml Nml Nml Nml Nml Nml Nml N/A  Right Gastroc Nml Nml Nml Nml Nml Nml Nml Nml Nml Nml Nml Nml N/A  Right Flex Dig Long Nml Nml Nml Nml Nml Nml Nml Nml Nml Nml Nml Nml N/A  Right RectFemoris Nml Nml Nml Nml 1- Rapid Few 1+ Few 1+ Nml Nml N/A  Right GluteusMed Nml Nml Nml Nml Nml Nml Nml Nml Nml Nml Nml Nml N/A  Right Lumbo Parasp Low Nml Nml Nml Nml Nml Nml Nml Nml Nml Nml Nml Nml N/A  Right Iliopsoas Nml Nml Nml Nml 1- Rapid Few 1+ Few 1+ Nml Nml N/A  Right VastusMed Nml Nml Nml Nml 1- Rapid Few 1+ Few 1+  Nml Nml N/A  Right AdductorLong Nml Nml Nml Nml Nml Nml Nml Nml Nml Nml Nml Nml N/A  Right BicepsFemS Nml Nml Nml Nml Nml Nml Nml Nml Nml Nml Nml Nml N/A      Waveforms:

## 2018-09-06 NOTE — Progress Notes (Signed)
Please call patient: I have reviewed his/her lab results. His nerve studies confirm what we had discussed: a femoral nerve neuropathy, most likely due to a complication of diabetes. It should improve over time; physical therapy may be helpful and we can get that set up if he'd like. Please forward results to his orthoepdist as well. He may f/u in the office with me to discuss further if needed.

## 2018-09-08 ENCOUNTER — Encounter: Payer: Self-pay | Admitting: Family Medicine

## 2018-09-08 ENCOUNTER — Ambulatory Visit (INDEPENDENT_AMBULATORY_CARE_PROVIDER_SITE_OTHER): Payer: BLUE CROSS/BLUE SHIELD | Admitting: Family Medicine

## 2018-09-08 ENCOUNTER — Other Ambulatory Visit: Payer: Self-pay

## 2018-09-08 VITALS — BP 102/60 | HR 76 | Temp 97.5°F | Ht 68.5 in | Wt 178.2 lb

## 2018-09-08 DIAGNOSIS — IMO0001 Reserved for inherently not codable concepts without codable children: Secondary | ICD-10-CM

## 2018-09-08 DIAGNOSIS — Z794 Long term (current) use of insulin: Secondary | ICD-10-CM | POA: Diagnosis not present

## 2018-09-08 DIAGNOSIS — G5721 Lesion of femoral nerve, right lower limb: Secondary | ICD-10-CM

## 2018-09-08 DIAGNOSIS — E1165 Type 2 diabetes mellitus with hyperglycemia: Secondary | ICD-10-CM

## 2018-09-08 LAB — POCT GLYCOSYLATED HEMOGLOBIN (HGB A1C): Hemoglobin A1C: 6.5 % — AB (ref 4.0–5.6)

## 2018-09-08 MED ORDER — PRAVASTATIN SODIUM 20 MG PO TABS: 20.0000 mg | ORAL_TABLET | Freq: Every day | ORAL | 3 refills | Status: DC

## 2018-09-08 NOTE — Progress Notes (Signed)
Subjective  CC:  Chief Complaint  Patient presents with  . Diabetes    last A1c- 07/21/2018= 10.5, wants to discuss Nerve Conduction Studies and physical therapy    HPI: Barry Schmidt is a 57 y.o. male who presents to the office today for follow up of diabetes and problems listed above in the chief complaint.   Diabetes follow up: His diabetic control is reported as Improved. We started sglt-2i along with metformin last visit: he is down more weight, continues to eat a diabetic diet and has not had any adverse effects. He denies sxs of hypotension. His fasting sugars are now averaging 130s and his postprandials are 150-170. This is a significant improvement.  He denies exertional CP or SOB or symptomatic hypoglycemia. He denies foot sores or paresthesias. He is now willing to start a statin. His urine was negative for microalbmin  Femoral neuropathy: we reviewed the results of the emg/ncv testing which confirmed my suspicion of femoral neuropathy/ or diabetic amytrophy. Fortunately, with elavil and gabapenin, he is much improved. No longer having pain. No longer having significant wealkness. Still with numbness over anterior thigh, rare mild discomfort.   Assessment  1. Uncontrolled diabetes mellitus without complication, with long-term current use of insulin (HCC)   2. Femoral neuropathy of right lower extremity      Plan   Diabetes is currently very well controlled. Congratulated. Now controlled. Discussed diet, eating enough! And warned against further weight loss. Continue bid glucose checks, monitor for hypoglycemia, and recheck in 6 weeks. May be able to lower medications at that time.   Add statin. Education given.   Improving femoral neuropathy related to uncontrolled diabetes: continue same meds and monitor for further improvement over time. No PT indicated at this time.   Follow up: Return in about 6 weeks (around 10/20/2018) for follow up Diabetes.. Orders Placed This  Encounter  Procedures  . POCT glycosylated hemoglobin (Hb A1C)   Meds ordered this encounter  Medications  . pravastatin (PRAVACHOL) 20 MG tablet    Sig: Take 1 tablet (20 mg total) by mouth daily.    Dispense:  90 tablet    Refill:  3      Immunization History  Administered Date(s) Administered  . Influenza,inj,Quad PF,6+ Mos 06/24/2018  . Pneumococcal Polysaccharide-23 10/06/2010  . Tdap 10/06/2012    Diabetes Related Lab Review: Lab Results  Component Value Date   HGBA1C 6.5 (A) 09/08/2018   HGBA1C 10.5 (A) 07/21/2018   HGBA1C 12.1 (A) 06/23/2018    Lab Results  Component Value Date   MICROALBUR <0.7 06/23/2018   Lab Results  Component Value Date   CREATININE 0.77 06/23/2018   BUN 20 06/23/2018   NA 132 (L) 06/23/2018   K 4.4 06/23/2018   CL 97 06/23/2018   CO2 28 06/23/2018   Lab Results  Component Value Date   CHOL 185 06/23/2018   Lab Results  Component Value Date   HDL 60.90 06/23/2018   No results found for: [facility] Lab Results  Component Value Date   TRIG (H) 06/23/2018    406.0 Triglyceride is over 400; calculations on Lipids are invalid.   Lab Results  Component Value Date   CHOLHDL 3 06/23/2018   Lab Results  Component Value Date   LDLDIRECT 90.0 06/23/2018   The 10-year ASCVD risk score Denman George DC Jr., et al., 2013) is: 10.3%   Values used to calculate the score:     Age: 28 years  Sex: Male     Is Non-Hispanic African American: No     Diabetic: Yes     Tobacco smoker: Yes     Systolic Blood Pressure: 102 mmHg     Is BP treated: No     HDL Cholesterol: 60.9 mg/dL     Total Cholesterol: 185 mg/dL I have reviewed the PMH, Fam and Soc history. Patient Active Problem List   Diagnosis Date Noted  . Uncontrolled diabetes mellitus without complication, with long-term current use of insulin (HCC) 06/23/2018    Diagnosed in 2007; was morbidly obese at that time 260 pounds. He lost weight and did well.      Social History: Patient   reports that he has been smoking cigarettes. He has never used smokeless tobacco. He reports that he drinks alcohol. He reports that he does not use drugs.  Review of Systems: Ophthalmic: negative for eye pain, loss of vision or double vision Cardiovascular: negative for chest pain Respiratory: negative for SOB or persistent cough Gastrointestinal: negative for abdominal pain Genitourinary: negative for dysuria or gross hematuria MSK: negative for foot lesions Neurologic: negative for weakness or gait disturbance  Wt Readings from Last 3 Encounters:  09/08/18 178 lb 3.2 oz (80.8 kg)  07/21/18 184 lb 6.4 oz (83.6 kg)  06/23/18 193 lb 12.8 oz (87.9 kg)    Objective  Vitals: BP 102/60   Pulse 76   Temp (!) 97.5 F (36.4 C)   Ht 5' 8.5" (1.74 m)   Wt 178 lb 3.2 oz (80.8 kg)   SpO2 96%   BMI 26.70 kg/m  General: well appearing, no acute distress  Psych:  Alert and oriented, normal mood and affect HEENT:  Normocephalic, atraumatic, moist mucous membranes, supple neck  Cardiovascular:  Nl S1 and S2, RRR without murmur, gallop or rub. no edema Respiratory:  Good breath sounds bilaterally, CTAB with normal effort, no rales Gastrointestinal: normal BS, soft, nontender Skin:  Warm, no rashes Neurologic:   Mental status is normal. normal gait, nl LE reflexes Strength: adductor and abductor, flexor and extensor hip 5/5 right leg.  Foot exam: no erythema, pallor, or cyanosis visible nl proprioception and sensation to monofilament testing bilaterally, +2 distal pulses bilaterally    Diabetic education: ongoing education regarding chronic disease management for diabetes was given today. We continue to reinforce the ABC's of diabetic management: A1c (<7 or 8 dependent upon patient), tight blood pressure control, and cholesterol management with goal LDL < 100 minimally. We discuss diet strategies, exercise recommendations, medication options and possible side effects. At each visit, we review  recommended immunizations and preventive care recommendations for diabetics and stress that good diabetic control can prevent other problems. See below for this patient's data.    Commons side effects, risks, benefits, and alternatives for medications and treatment plan prescribed today were discussed, and the patient expressed understanding of the given instructions. Patient is instructed to call or message via MyChart if he/she has any questions or concerns regarding our treatment plan. No barriers to understanding were identified. We discussed Red Flag symptoms and signs in detail. Patient expressed understanding regarding what to do in case of urgent or emergency type symptoms.   Medication list was reconciled, printed and provided to the patient in AVS. Patient instructions and summary information was reviewed with the patient as documented in the AVS. This note was prepared with assistance of Dragon voice recognition software. Occasional wrong-word or sound-a-like substitutions may have occurred due to the inherent limitations of  voice recognition software

## 2018-09-08 NOTE — Patient Instructions (Addendum)
Please return in 6-12 weeks for recheck diabetes, weight and blood pressure.  Come back sooner if you are having any low blood sugars or lightheadedness.   Be sure to eat enough calories and add protein to your fruit snacks.   Continue the elavil. We are starting pravastatin for your cholesterol levels and to decrease the risk of stroke and heart attacks.  If you have any questions or concerns, please don't hesitate to send me a message via MyChart or call the office at [phone]. Thank you for visiting with [name] today! It's our pleasure caring for you.   Type 2 Diabetes Mellitus, Self Care, Adult When you have type 2 diabetes (type 2 diabetes mellitus), you must keep your blood sugar (glucose) under control. You can do this with:  Nutrition.  Exercise.  Lifestyle changes.  Medicines or insulin, if needed.  Support from your doctors and others.  How do I manage my blood sugar?  Check your blood sugar level every day, as often as told.  Call your doctor if your blood sugar is above your goal numbers for 2 tests in a row.  Have your A1c (hemoglobin A1c) level checked at least two times a year. Have it checked more often if your doctor tells you to. Your doctor will set treatment goals for you. Generally, you should have these blood sugar levels:  Before meals (preprandial): 80-130 mg/dL (4.4-7.2 mmol/L).  After meals (postprandial): lower than 180 mg/dL (10 mmol/L).  A1c level: less than 7%.  What do I need to know about high blood sugar? High blood sugar is called hyperglycemia. Know the signs of high blood sugar. Signs may include:  Feeling: ? Thirsty. ? Hungry. ? Very tired.  Needing to pee (urinate) more than usual.  Blurry vision.  What do I need to know about low blood sugar? Low blood sugar is called hypoglycemia. This is when blood sugar is at or below 70 mg/dL (3.9 mmol/L). Symptoms may include:  Feeling: ? Hungry. ? Worried or nervous  (anxious). ? Sweaty and clammy. ? Confused. ? Dizzy. ? Sleepy. ? Sick to your stomach (nauseous).  Having: ? A fast heartbeat (palpitations). ? A headache. ? A change in your vision. ? Jerky movements that you cannot control (seizure). ? Nightmares. ? Tingling or no feeling (numbness) around the mouth, lips, or tongue.  Having trouble with: ? Talking. ? Paying attention (concentrating). ? Moving (coordination). ? Sleeping.  Shaking.  Passing out (fainting).  Getting upset easily (irritability).  Treating low blood sugar  To treat low blood sugar, eat or drink something sugary right away. If you can think clearly and swallow safely, follow the 15:15 rule:  Take 15 grams of a fast-acting carb (carbohydrate). Some fast-acting carbs are: ? 1 tube of glucose gel. ? 3 sugar tablets (glucose pills). ? 6-8 pieces of hard candy. ? 4 oz (120 mL) of fruit juice. ? 4 oz (120 mL) regular (not diet) soda.  Check your blood sugar 15 minutes after you take the carb.  If your blood sugar is still at or below 70 mg/dL (3.9 mmol/L), take 15 grams of a carb again.  If your blood sugar does not go above 70 mg/dL (3.9 mmol/L) after 3 tries, get help right away.  After your blood sugar goes back to normal, eat a meal or a snack within 1 hour.  Treating very low blood sugar If your blood sugar is at or below 54 mg/dL (3 mmol/L), you have very low blood  sugar (severe hypoglycemia). This is an emergency. Do not wait to see if the symptoms will go away. Get medical help right away. Call your local emergency services (911 in the [place] Do not drive yourself to the hospital. If you have very low blood sugar and you cannot eat or drink, you may need a glucagon shot (injection). A family member or friend should learn how to check your blood sugar and how to give you a glucagon shot. Ask your doctor if you need to have a glucagon shot kit at home. What else is important to manage my  diabetes? Medicine Follow these instructions about insulin and diabetes medicines:  Take them as told by your doctor.  Adjust them as told by your doctor.  Do not run out of them.  Having diabetes can raise your risk for other long-term conditions. These include heart or kidney disease. Your doctor may prescribe medicines to help prevent problems from diabetes. Food   Make healthy food choices. These include: ? Chicken, fish, egg whites, and beans. ? Oats, whole wheat, bulgur, brown rice, quinoa, and millet. ? Fresh fruits and vegetables. ? Low-fat dairy products. ? Nuts, avocado, olive oil, and canola oil.  Make a food plan with a specialist (dietitian).  Follow instructions from your doctor about what you cannot eat or drink.  Drink enough fluid to keep your pee (urine) clear or pale yellow.  Eat healthy snacks between healthy meals.  Keep track of carbs that you eat. Read food labels. Learn food serving sizes.  Follow your sick day plan when you cannot eat or drink normally. Make this plan with your doctor so it is ready to use. Activity  Exercise at least 3 times a week.  Do not go more than 2 days without exercising.  Talk with your doctor before you start a new exercise. Your doctor may need to adjust your insulin, medicines, or food. Lifestyle   Do not use any tobacco products. These include cigarettes, chewing tobacco, and e-cigarettes.If you need help quitting, ask your doctor.  Ask your doctor how much alcohol is safe for you.  Learn to deal with stress. If you need help with this, ask your doctor. Body care  Stay up to date with your shots (immunizations).  Have your eyes and feet checked by a doctor as often as told.  Check your skin and feet every day. Check for cuts, bruises, redness, blisters, or sores.  Brush your teeth and gums two times a day.  Floss at least one time a day.  Go to the dentist least one time every 6 months.  Stay at a  healthy weight. General instructions   Take over-the-counter and prescription medicines only as told by your doctor.  Share your diabetes care plan with: ? Your work or school. ? People you live with.  Check your pee (urine) for ketones: ? When you are sick. ? As told by your doctor.  Carry a card or wear jewelry that says that you have diabetes.  Ask your doctor: ? Do I need to meet with a diabetes educator? ? Where can I find a support group for people with diabetes?  Keep all follow-up visits as told by your doctor. This is important. Where to find more information: To learn more about diabetes, visit:  American Diabetes Association: www.diabetes.org  American Association of Diabetes Educators: www.diabeteseducator.org/patient-resources  This information is not intended to replace advice given to you by your health care provider. Make sure you  discuss any questions you have with your health care provider. Document Released: 01/14/2016 Document Revised: 02/28/2016 Document Reviewed: 10/26/2015 Elsevier Interactive Patient Education  [employer].

## 2018-09-09 ENCOUNTER — Encounter: Payer: Self-pay | Admitting: Family Medicine

## 2018-09-19 ENCOUNTER — Other Ambulatory Visit: Payer: Self-pay | Admitting: Family Medicine

## 2018-10-14 ENCOUNTER — Encounter: Payer: Self-pay | Admitting: Family Medicine

## 2018-10-14 ENCOUNTER — Other Ambulatory Visit: Payer: Self-pay

## 2018-10-14 ENCOUNTER — Ambulatory Visit (INDEPENDENT_AMBULATORY_CARE_PROVIDER_SITE_OTHER): Payer: BLUE CROSS/BLUE SHIELD | Admitting: Family Medicine

## 2018-10-14 VITALS — BP 122/74 | HR 71 | Temp 98.2°F | Resp 16 | Ht 68.5 in | Wt 175.8 lb

## 2018-10-14 DIAGNOSIS — E1169 Type 2 diabetes mellitus with other specified complication: Secondary | ICD-10-CM | POA: Insufficient documentation

## 2018-10-14 DIAGNOSIS — E1144 Type 2 diabetes mellitus with diabetic amyotrophy: Secondary | ICD-10-CM

## 2018-10-14 DIAGNOSIS — E1140 Type 2 diabetes mellitus with diabetic neuropathy, unspecified: Secondary | ICD-10-CM

## 2018-10-14 DIAGNOSIS — J0100 Acute maxillary sinusitis, unspecified: Secondary | ICD-10-CM | POA: Diagnosis not present

## 2018-10-14 DIAGNOSIS — G5721 Lesion of femoral nerve, right lower limb: Secondary | ICD-10-CM

## 2018-10-14 DIAGNOSIS — Z79899 Other long term (current) drug therapy: Secondary | ICD-10-CM

## 2018-10-14 HISTORY — DX: Other long term (current) drug therapy: Z79.899

## 2018-10-14 LAB — POCT GLYCOSYLATED HEMOGLOBIN (HGB A1C): HEMOGLOBIN A1C: 6.1 % — AB (ref 4.0–5.6)

## 2018-10-14 MED ORDER — DAPAGLIFLOZIN PROPANEDIOL 5 MG PO TABS: 5.0000 mg | ORAL_TABLET | Freq: Every day | ORAL | 11 refills | Status: DC

## 2018-10-14 MED ORDER — DOXYCYCLINE HYCLATE 100 MG PO TABS: 100.0000 mg | ORAL_TABLET | Freq: Two times a day (BID) | ORAL | 0 refills | Status: DC

## 2018-10-14 NOTE — Patient Instructions (Addendum)
Please return in 3 months for for your annual complete physical; please come fasting. Please cancel your appt that is scheduled in 2 weeks.   Your diabetes looks great. Decrease the farxiga to 5mg  daily.  Keep taking your other medications as is.   If you have any questions or concerns, please don't hesitate to send me a message via MyChart or call the office at [phone]. Thank you for visiting with [name] today! It's our pleasure caring for you.  Please set up an appointment for a diabetic eye exam and have the results sent to me.    Sinusitis, Adult Sinusitis is soreness and swelling (inflammation) of your sinuses. Sinuses are hollow spaces in the bones around your face. They are located:  Around your eyes.  In the middle of your forehead.  Behind your nose.  In your cheekbones. Your sinuses and nasal passages are lined with a fluid called mucus. Mucus drains out of your sinuses. Swelling can trap mucus in your sinuses. This lets germs (bacteria, virus, or fungus) grow, which leads to infection. Most of the time, this condition is caused by a virus. What are the causes? This condition is caused by:  Allergies.  Asthma.  Germs.  Things that block your nose or sinuses.  Growths in the nose (nasal polyps).  Chemicals or irritants in the air.  Fungus (rare). What increases the risk? You are more likely to develop this condition if:  You have a weak body defense system (immune system).  You do a lot of swimming or diving.  You use nasal sprays too much.  You smoke. What are the signs or symptoms? The main symptoms of this condition are pain and a feeling of pressure around the sinuses. Other symptoms include:  Stuffy nose (congestion).  Runny nose (drainage).  Swelling and warmth in the sinuses.  Headache.  Toothache.  A cough that may get worse at night.  Mucus that collects in the throat or the back of the nose (postnasal drip).  Being unable to smell  and taste.  Being very tired (fatigue).  A fever.  Sore throat.  Bad breath. How is this diagnosed? This condition is diagnosed based on:  Your symptoms.  Your medical history.  A physical exam.  Tests to find out if your condition is short-term (acute) or long-term (chronic). Your doctor may: ? Check your nose for growths (polyps). ? Check your sinuses using a tool that has a light (endoscope). ? Check for allergies or germs. ? Do imaging tests, such as an MRI or CT scan. How is this treated? Treatment for this condition depends on the cause and whether it is short-term or long-term.  If caused by a virus, your symptoms should go away on their own within 10 days. You may be given medicines to relieve symptoms. They include: ? Medicines that shrink swollen tissue in the nose. ? Medicines that treat allergies (antihistamines). ? A spray that treats swelling of the nostrils. ? Rinses that help get rid of thick mucus in your nose (nasal saline washes).  If caused by bacteria, your doctor may wait to see if you will get better without treatment. You may be given antibiotic medicine if you have: ? A very bad infection. ? A weak body defense system.  If caused by growths in the nose, you may need to have surgery. Follow these instructions at home: Medicines  Take, use, or apply over-the-counter and prescription medicines only as told by your doctor. These may  include nasal sprays.  If you were prescribed an antibiotic medicine, take it as told by your doctor. Do not stop taking the antibiotic even if you start to feel better. Hydrate and humidify   Drink enough water to keep your pee (urine) pale yellow.  Use a cool mist humidifier to keep the humidity level in your home above 50%.  Breathe in steam for 10-15 minutes, 3-4 times a day, or as told by your doctor. You can do this in the bathroom while a hot shower is running.  Try not to spend time in cool or dry  air. Rest  Rest as much as you can.  Sleep with your head raised (elevated).  Make sure you get enough sleep each night. General instructions   Put a warm, moist washcloth on your face 3-4 times a day, or as often as told by your doctor. This will help with discomfort.  Wash your hands often with soap and water. If there is no soap and water, use hand sanitizer.  Do not smoke. Avoid being around people who are smoking (secondhand smoke).  Keep all follow-up visits as told by your doctor. This is important. Contact a doctor if:  You have a fever.  Your symptoms get worse.  Your symptoms do not get better within 10 days. Get help right away if:  You have a very bad headache.  You cannot stop throwing up (vomiting).  You have very bad pain or swelling around your face or eyes.  You have trouble seeing.  You feel confused.  Your neck is stiff.  You have trouble breathing. Summary  Sinusitis is swelling of your sinuses. Sinuses are hollow spaces in the bones around your face.  This condition is caused by tissues in your nose that become inflamed or swollen. This traps germs. These can lead to infection.  If you were prescribed an antibiotic medicine, take it as told by your doctor. Do not stop taking it even if you start to feel better.  Keep all follow-up visits as told by your doctor. This is important. This information is not intended to replace advice given to you by your health care provider. Make sure you discuss any questions you have with your health care provider. Document Released: 03/10/2008 Document Revised: 02/22/2018 Document Reviewed: 02/22/2018 Elsevier Interactive Patient Education  2019 [employer].

## 2018-10-14 NOTE — Progress Notes (Signed)
Subjective  CC:  Chief Complaint  Patient presents with  . Sinusitis    Started 1 1/2 weeks ago.. Reports green mucous.. Has tried Mucinex and Benadryl  . Diabetes    HPI: Barry Schmidt is a 58 y.o. male who presents to the office today for follow up of diabetes and problems listed above in the chief complaint.   Diabetes follow up: His diabetic control is reported as Improved.  Fasting sugars are well controlled averaging between 100 -120.  He continues to eat well.  He continues to lose weight.  He reports he is eating enough calories. He denies exertional CP or SOB or symptomatic hypoglycemia. He denies foot sores or paresthesias.   Sinusitis sxs x 2 weeks; lingering sinus pressure and thick drainage. Some malaise. No high fever or sob. Mild cough. No ST or myalgias.  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  Has intermittent pain.  Using gabapentin twice daily.  No further weakness  Assessment  1. 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 Acute non-recurrent maxillary sinusitis   3. On statin therapy due to risk of future cardiovascular event   4. Femoral neuropathy of right lower extremity   5. 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  Achieved excellent control over the last 3 months with diet changes and medications.  Will decrease Farxiga to 5 mg nightly.  Monitor weight closely.  Recheck 3 months.  He is not on an ACE inhibitor, he is normotensive.  Urine microalbuminuria was negative.  He is taking a statin tolerating it well  Acute sinusitis: Treat with antibiotics and supportive care  A myotrophy: Continues to improve.  Recommend continue gabapentin for 3 more months.  Then will try to wean it.  Follow up: Return in about 3 months (around 01/13/2019) for complete physical.. Orders Placed This  Encounter  Procedures  . POCT glycosylated hemoglobin (Hb A1C)   Meds ordered this encounter  Medications  . doxycycline (VIBRA-TABS) 100 MG tablet    Sig: Take 1 tablet (100 mg total) by mouth 2 (two) times daily.    Dispense:  20 tablet    Refill:  0  . dapagliflozin propanediol (FARXIGA) 5 MG TABS tablet    Sig: Take 5 mg by mouth daily.    Dispense:  30 tablet    Refill:  11      Immunization History  Administered Date(s) Administered  . Influenza,inj,Quad PF,6+ Mos 06/24/2018  . Pneumococcal Polysaccharide-23 10/06/2010  . Tdap 10/06/2012    Diabetes Related Lab Review: Lab Results  Component Value Date   HGBA1C 6.1 (A) 10/14/2018   HGBA1C 6.5 (A) 09/08/2018   HGBA1C 10.5 (A) 07/21/2018    Lab Results  Component Value Date   MICROALBUR <0.7 06/23/2018   Lab Results  Component Value Date   CREATININE 0.77 06/23/2018   BUN 20 06/23/2018   NA 132 (L) 06/23/2018   K 4.4 06/23/2018   CL 97 06/23/2018   CO2 28 06/23/2018   Lab Results  Component Value Date   CHOL 185 06/23/2018   Lab Results  Component Value Date   HDL 60.90 06/23/2018   No results found for: [facility] Lab Results  Component Value Date   TRIG (H) 06/23/2018    406.0 Triglyceride is over 400; calculations on Lipids  are invalid.   Lab Results  Component Value Date   CHOLHDL 3 06/23/2018   Lab Results  Component Value Date   LDLDIRECT 90.0 06/23/2018   The 10-year ASCVD risk score Mikey Bussing DC Jr., et al., 2013) is: 13.8%   Values used to calculate the score:     Age: 76 years     Sex: Male     Is Non-Hispanic African American: No     Diabetic: Yes     Tobacco smoker: Yes     Systolic Blood Pressure: 081 mmHg     Is BP treated: No     HDL Cholesterol: 60.9 mg/dL     Total Cholesterol: 185 mg/dL I have reviewed the PMH, Fam and Soc history. Patient Active Problem List   Diagnosis Date Noted  . 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 He lost weight and did well.  Negative urine MAC 07/2018; not on ACE. Normotensive   . On statin therapy due to risk of future cardiovascular event 10/14/2018  . Femoral neuropathy of right lower extremity 10/14/2018    Social History: Patient  reports that he has been smoking cigarettes. He has never used smokeless tobacco. He reports current alcohol use. He reports that he does not use drugs.  Review of Systems: Ophthalmic: negative for eye pain, loss of vision or double vision Cardiovascular: negative for chest pain Respiratory: negative for SOB or persistent cough Gastrointestinal: negative for abdominal pain Genitourinary: negative for dysuria or gross hematuria MSK: negative for foot lesions Neurologic: negative for weakness or gait disturbance Wt Readings from Last 3 Encounters:  10/14/18 175 lb 12.8 oz (79.7 kg)  09/08/18 178 lb 3.2 oz (80.8 kg)  07/21/18 184 lb 6.4 oz (83.6 kg)    Objective  Vitals: BP 122/74   Pulse 71   Temp 98.2 F (36.8 C) (Oral)   Resp 16   Ht 5' 8.5" (1.74 m)   Wt 175 lb 12.8 oz (79.7 kg)   SpO2 97%   BMI 26.34 kg/m  General: well appearing, no acute distress  Psych:  Alert and oriented, normal mood and affect HEENT:  Normocephalic, atraumatic, moist mucous membranes, supple neck, bilateral sinus tenderness present Cardiovascular:  Nl S1 and S2, RRR without murmur, gallop or rub. no edema Respiratory:  Good breath sounds bilaterally, CTAB with normal effort, no rales     Diabetic education: ongoing education regarding chronic disease management for diabetes was given today. We continue to reinforce the ABC's of diabetic management: A1c (<7 or 8 dependent upon patient), tight blood pressure control, and cholesterol management with goal LDL < 100 minimally. We discuss diet strategies, exercise recommendations, medication options and possible side effects. At each visit, we review recommended  immunizations and preventive care recommendations for diabetics and stress that good diabetic control can prevent other problems. See below for this patient's data.    Commons side effects, risks, benefits, and alternatives for medications and treatment plan prescribed today were discussed, and the patient expressed understanding of the given instructions. Patient is instructed to call or message via MyChart if he/she has any questions or concerns regarding our treatment plan. No barriers to understanding were identified. We discussed Red Flag symptoms and signs in detail. Patient expressed understanding regarding what to do in case of urgent or emergency type symptoms.   Medication list was reconciled, printed and provided to the patient in AVS.  Patient instructions and summary information was reviewed with the patient as documented in the AVS. This note was prepared with assistance of Dragon voice recognition software. Occasional wrong-word or sound-a-like substitutions may have occurred due to the inherent limitations of voice recognition software

## 2018-10-18 ENCOUNTER — Telehealth: Payer: Self-pay | Admitting: Family Medicine

## 2018-10-18 MED ORDER — DAPAGLIFLOZIN PROPANEDIOL 10 MG PO TABS: ORAL_TABLET | ORAL | 1 refills | Status: DC

## 2018-10-18 NOTE — Telephone Encounter (Signed)
Pt aware and verbalized understanding.  

## 2018-10-18 NOTE — Telephone Encounter (Signed)
Copied from CRM [id number]. Topic: Quick Communication - See Telephone Encounter >> Oct 18, 2018  8:39 AM Herby Abraham C wrote: CRM for notification. See Telephone encounter for: 10/18/18.   Pt's spouse call in to ask, with Rx dapagliflozin propanediol (FARXIGA) 5 MG TABS tablet, pt was previously taking 10MG , they would like to know if Rx could be changed back to it's original dose?Spouse says that she will just cut them in half.  ALSO, spouse states that Rx has to be for 90 days. Spouse says that Rx has to be faxed to pharmacy.   Pharmacy: [id number] - [place], [place] - [address]   CB: [phone] - [name]

## 2018-10-18 NOTE — Telephone Encounter (Signed)
Please call patient's wife and notify her that I can change the Farxiga back to 10 mg tablets to be taken one half tab daily.  However, a 90-day dose would be prescribed, therefore he would be getting only 45 pills.  Please refill whichever dose she prefers and fax to pharmacy for 90-day supply.  Thank you

## 2018-10-18 NOTE — Telephone Encounter (Signed)
Please see below and advise.

## 2018-11-02 ENCOUNTER — Ambulatory Visit: Payer: BLUE CROSS/BLUE SHIELD | Admitting: Family Medicine

## 2018-11-02 LAB — HM DIABETES EYE EXAM

## 2018-11-25 ENCOUNTER — Telehealth: Payer: Self-pay | Admitting: Family Medicine

## 2018-11-25 NOTE — Telephone Encounter (Signed)
Copied from CRM [id number]. Topic: Quick Communication - See Telephone Encounter >> Nov 25, 2018  9:18 AM Fanny Bien wrote: CRM for notification. See Telephone encounter for: 11/25/18. Pt called and stated that he started taking dapagliflozin propanediol (FARXIGA) 10 MG TABS tablet [[id number]] he has been having pain from his right buttocks to knee. Pt would like a call back regarding. Please advise

## 2018-11-25 NOTE — Telephone Encounter (Signed)
FYI.. He is scheduled to see [facility] with pt, he has a HX of sciatica and unsure if he is having sciatica pain since pain starts at buttocks and radiates down foot. He is unable to come today, aware Dr. Mardelle Matte out of office tomorrow, and is fine with seeing someone else within the office.

## 2018-11-26 ENCOUNTER — Encounter: Payer: Self-pay | Admitting: Physician Assistant

## 2018-11-26 ENCOUNTER — Other Ambulatory Visit: Payer: Self-pay

## 2018-11-26 ENCOUNTER — Ambulatory Visit (INDEPENDENT_AMBULATORY_CARE_PROVIDER_SITE_OTHER): Payer: BLUE CROSS/BLUE SHIELD | Admitting: Physician Assistant

## 2018-11-26 VITALS — BP 120/70 | HR 63 | Temp 97.7°F | Resp 16 | Ht 69.0 in | Wt 180.0 lb

## 2018-11-26 DIAGNOSIS — M5441 Lumbago with sciatica, right side: Secondary | ICD-10-CM | POA: Diagnosis not present

## 2018-11-26 MED ORDER — KETOROLAC TROMETHAMINE 60 MG/2ML IM SOLN
60.0000 mg | Freq: Once | INTRAMUSCULAR | Status: AC
  Administered 2018-11-26: 60 mg via INTRAMUSCULAR

## 2018-11-26 MED ORDER — HYDROCODONE-ACETAMINOPHEN 10-325 MG PO TABS: 1.0000 | ORAL_TABLET | Freq: Three times a day (TID) | ORAL | 0 refills | Status: DC | PRN

## 2018-11-26 MED ORDER — PREDNISONE 10 MG PO TABS: ORAL_TABLET | ORAL | 0 refills | Status: AC

## 2018-11-26 MED ORDER — CYCLOBENZAPRINE HCL 10 MG PO TABS: 10.0000 mg | ORAL_TABLET | Freq: Three times a day (TID) | ORAL | 0 refills | Status: DC | PRN

## 2018-11-26 NOTE — Progress Notes (Signed)
Patient presents to clinic today c/o 1-2 weeks of pain in R lower back and buttocks radiating down to the knee. Denies any trauma or injury to the area. Denies heavy lifting. Denies numbness, tingling or weakness of extremity. Denies change in bowel/bladder habits or saddle anesthesia. Has been taking Ibuprofen without much improvement. Is taking Gabapentin chronically. Endorses is still taking as directed.   Past Medical History:  Diagnosis Date  . Diabetes mellitus without complication ([place])   . On statin therapy due to risk of future cardiovascular event 10/14/2018  . Uncontrolled diabetes mellitus without complication, with long-term current use of insulin ([place]) 06/23/2018   Diagnosed in 2007; was morbidly obese at that time 260 pounds. He lost weight and did well.     Current Outpatient Medications on File Prior to Visit  Medication Sig Dispense Refill  . ACCU-CHEK AVIVA PLUS test strip USE AS INSTRUCTED TO TEST BLOOD GLUCOSE 100 each 12  . amitriptyline (ELAVIL) 25 MG tablet TAKE 1 TABLET BY MOUTH EVERYDAY AT BEDTIME 30 tablet 2  . Blood Glucose Monitoring Suppl (ONE TOUCH ULTRA 2) w/Device KIT 1 kit by Does not apply route 2 (two) times daily. 1 each 0  . dapagliflozin propanediol (FARXIGA) 10 MG TABS tablet Take 1/2 tablet daily 45 tablet 1  . gabapentin (NEURONTIN) 300 MG capsule TAKE 1 CAPSULE BY MOUTH THREE TIMES A DAY 270 capsule 1  . Lancets (ONETOUCH ULTRASOFT) lancets Use as instructed 100 each 12  . metFORMIN (GLUCOPHAGE) 1000 MG tablet Take 1 tablet (1,000 mg total) by mouth 2 (two) times daily with a meal. 180 tablet 3  . methocarbamol (ROBAXIN) 500 MG tablet methocarbamol 500 mg tablet    . pravastatin (PRAVACHOL) 20 MG tablet Take 1 tablet (20 mg total) by mouth daily. 90 tablet 3   No current facility-administered medications on file prior to visit.     Allergies  Allergen Reactions  . Penicillins     Family History  Problem Relation Age of Onset  . Heart  disease Mother        PTCA with stent  . Hypertension Mother   . Heart attack Father   . Diabetes type I Father   . Diabetes Brother   . Diabetes Sister   . Diabetes Brother   . Healthy Son   . Healthy Son     Social History   Socioeconomic History  . Marital status: Married    Spouse name: Not on file  . Number of children: Not on file  . Years of education: Not on file  . Highest education level: Not on file  Occupational History  . Occupation: Freight forwarder, Academic librarian: [employer]  . Financial resource strain: Not on file  . Food insecurity:    Worry: Not on file    Inability: Not on file  . Transportation needs:    Medical: Not on file    Non-medical: Not on file  Tobacco Use  . Smoking status: Current Every Day Smoker    Types: Cigarettes  . Smokeless tobacco: Never Used  . Tobacco comment: 3-4 q day   Substance and Sexual Activity  . Alcohol use: Yes  . Drug use: Never  . Sexual activity: Yes    Partners: Female  Lifestyle  . Physical activity:    Days per week: Not on file    Minutes per session: Not on file  . Stress: Not on file  Relationships  . Social connections:  Talks on phone: Not on file    Gets together: Not on file    Attends religious service: Not on file    Active member of club or organization: Not on file    Attends meetings of clubs or organizations: Not on file    Relationship status: Not on file  Other Topics Concern  . Not on file  Social History Narrative  . Not on file   Review of Systems - See HPI.  All other ROS are negative.  BP 120/70   Pulse 63   Temp 97.7 F (36.5 C) (Oral)   Resp 16   Ht 5' 9"  (1.753 m)   Wt 180 lb (81.6 kg)   SpO2 99%   BMI 26.58 kg/m   Physical Exam Constitutional:      Appearance: Normal appearance.  HENT:     Head: Normocephalic and atraumatic.     Right Ear: Tympanic membrane normal.     Left Ear: Tympanic membrane normal.     Nose: Nose normal.  Neck:      Musculoskeletal: Neck supple.  Cardiovascular:     Rate and Rhythm: Normal rate and regular rhythm.     Heart sounds: Normal heart sounds.  Pulmonary:     Effort: Pulmonary effort is normal.     Breath sounds: Normal breath sounds.  Musculoskeletal:     Right hip: He exhibits normal range of motion, normal strength and no tenderness.     Cervical back: Normal.     Thoracic back: Normal.     Lumbar back: He exhibits pain. He exhibits normal range of motion, no tenderness and no spasm.  Neurological:     Mental Status: He is alert.    Recent Results (from the past 2160 hour(s))  POCT glycosylated hemoglobin (Hb A1C)     Status: Abnormal   Collection Time: 09/08/18 10:17 AM  Result Value Ref Range   Hemoglobin A1C 6.5 (A) 4.0 - 5.6 %   HbA1c POC (<> result, manual entry)     HbA1c, POC (prediabetic range)     HbA1c, POC (controlled diabetic range)    POCT glycosylated hemoglobin (Hb A1C)     Status: Abnormal   Collection Time: 10/14/18  8:48 AM  Result Value Ref Range   Hemoglobin A1C 6.1 (A) 4.0 - 5.6 %   HbA1c POC (<> result, manual entry)     HbA1c, POC (prediabetic range)     HbA1c, POC (controlled diabetic range)    HM DIABETES EYE EXAM     Status: None   Collection Time: 11/02/18 12:55 PM  Result Value Ref Range   HM Diabetic Eye Exam      Assessment/Plan: 1. Acute right-sided low back pain with right-sided sciatica Atraumatic. Without alarm signs or symptoms. Discussed treatment. IM Toradol given today. Will stop old Robaxin. Start Flexeril. Supportive measures and stretches discussed. A1C at 6.1. Will start small steroid taper to help with inflammation. Patient requesting pain medication as he states his PCP usually gives him a couple of days of this to help when he has a flare of back pain. Will give 4 tablets to use sparingly.   - predniSONE (DELTASONE) 10 MG tablet; Take 4 tablets (40 mg total) by mouth daily with breakfast for 2 days, THEN 3 tablets (30 mg total)  daily with breakfast for 2 days, THEN 2 tablets (20 mg total) daily with breakfast for 2 days, THEN 1 tablet (10 mg total) daily with breakfast for 2 days.  Dispense: 20 tablet; Refill: 0 - cyclobenzaprine (FLEXERIL) 10 MG tablet; Take 1 tablet (10 mg total) by mouth 3 (three) times daily as needed for muscle spasms.  Dispense: 15 tablet; Refill: 0 - HYDROcodone-acetaminophen (NORCO) 10-325 MG tablet; Take 1 tablet by mouth every 8 (eight) hours as needed.  Dispense: 4 tablet; Refill: 0 - ketorolac (TORADOL) injection 60 mg   Leeanne Rio, [place]

## 2018-11-26 NOTE — Telephone Encounter (Signed)
Noted  

## 2018-11-26 NOTE — Telephone Encounter (Signed)
Copied from CRM [id number]. Topic: General - Other >> Nov 26, 2018  1:25 PM Elliot Gault wrote: Patient states the pharmacy is only issuing 100 test strips stating check 1x daily. Pharmacy advised in order to receive more  test strips PCP has to specify on prescription ok to check 2 to 3x daily. Patient is seeking new rx at upcoming appt 4/1/20220 and wanted this information noted.

## 2018-11-26 NOTE — Patient Instructions (Signed)
Please avoid any heavy lifting or overexertion.  Continue heating pad in 10-15 minute intervals.  The injection given today should start to calm down symptoms. Start the steroid as directed tomorrow. Take the steroid taper as directed.  Continue Gabapentin. The hydrocodone is to use only for severe pain only.  I have only given a couple of tablets of this. The Flexeril should calm down muscle tension.  Follow-up with Dr. Mardelle Matte if symptoms are not improving/resolving. No driving while on medication.

## 2018-12-02 ENCOUNTER — Telehealth: Payer: Self-pay | Admitting: Family Medicine

## 2018-12-02 NOTE — Telephone Encounter (Signed)
Please advise since Dr. Mardelle Matte not in office and pt last seen in office for problem by you

## 2018-12-02 NOTE — Telephone Encounter (Signed)
Recommend reassessment if symptoms are not improving. At this point, imaging may be needed.

## 2018-12-02 NOTE — Telephone Encounter (Signed)
Copied from CRM [id number]. Topic: Quick Communication - See Telephone Encounter >> Dec 02, 2018  4:41 PM [name] A wrote: CRM for notification. See Telephone encounter for: 12/02/18.  Patient is calling because he is have pain with his sciatic nerve. He is wanting advice on what Dr. Mardelle Matte would like him to do. Pain is from lower right buttock to his knee. A shot was given to him last week. With no improvement Please advise [phone]

## 2018-12-06 NOTE — Telephone Encounter (Signed)
Please set up office visit for recheck with me. Thanks.

## 2018-12-06 NOTE — Telephone Encounter (Signed)
LMOVM for pt to reuturn call to schedule f.u with Dr. Mardelle Matte.. [facility] may schedule appointment

## 2018-12-17 ENCOUNTER — Ambulatory Visit (INDEPENDENT_AMBULATORY_CARE_PROVIDER_SITE_OTHER): Payer: BLUE CROSS/BLUE SHIELD | Admitting: Family Medicine

## 2018-12-17 ENCOUNTER — Other Ambulatory Visit: Payer: Self-pay

## 2018-12-17 ENCOUNTER — Encounter: Payer: Self-pay | Admitting: Family Medicine

## 2018-12-17 VITALS — BP 118/74 | HR 82 | Temp 98.2°F | Resp 16 | Ht 69.0 in | Wt 184.4 lb

## 2018-12-17 DIAGNOSIS — E1144 Type 2 diabetes mellitus with diabetic amyotrophy: Secondary | ICD-10-CM

## 2018-12-17 DIAGNOSIS — G5721 Lesion of femoral nerve, right lower limb: Secondary | ICD-10-CM | POA: Diagnosis not present

## 2018-12-17 MED ORDER — HYDROCODONE-ACETAMINOPHEN 5-325 MG PO TABS: 1.0000 | ORAL_TABLET | Freq: Four times a day (QID) | ORAL | 0 refills | Status: DC | PRN

## 2018-12-17 MED ORDER — GABAPENTIN 300 MG PO CAPS: 600.0000 mg | ORAL_CAPSULE | Freq: Three times a day (TID) | ORAL | 1 refills | Status: DC

## 2018-12-17 NOTE — Progress Notes (Signed)
Subjective  CC:  Chief Complaint  Patient presents with  . Leg Pain    Right side.    HPI: Barry Schmidt is a 58 y.o. male who presents to the office today to address the problems listed above in the chief complaint.  57 year old male with now well-controlled diabetes presents for recurrent right-sided anterior thigh pain with weakness in shooting pain into the lower leg.  He presented with the same symptoms back in September.  He has been evaluated by orthopedics with multiple MRIs including brain and lumbar.  He is also under control is done which showed a femoral neuropathy.  He was treated with prednisone, gabapentin and pain medicines.  Over the course of several months.  His pain resolved and his weakness improved.  However over the last several weeks things have resurfaced.  He is having faculty climbing stairs due to leg weakness.  Pain is described as burning.  He was treated after evaluation a week or 2 ago with prednisone without any improvement in symptoms.  He denies bowel or bladder dysfunction. Assessment  1. Femoral neuropathy of right lower extremity   2. 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  Hydrocodone ordered for severe pain.  Titrate up gabapentin to 600 3 times daily as tolerated.  Recheck in 2 weeks if not improving.  Referral to neurology for further input and management.  Follow up: Follow-up as scheduled 01/05/2019  Orders Placed This Encounter  Procedures  . Ambulatory referral to Neurology   Meds ordered this encounter  Medications  . HYDROcodone-acetaminophen (NORCO) 5-325 MG tablet    Sig: Take 1 tablet by mouth every 6 (six) hours as needed for moderate pain.    Dispense:  20 tablet    Refill:  0  . gabapentin (NEURONTIN) 300 MG capsule    Sig: Take 2 capsules (600 mg total) by mouth 3 (three) times daily.    Dispense:  270 capsule    Refill:  1      I reviewed the patients updated PMH, FH, and SocHx.    Patient Active Problem List   Diagnosis Date Noted  . Controlled type 2 diabetes mellitus with neuropathy ([place]) 10/14/2018  . On statin therapy due to risk of future cardiovascular event 10/14/2018  . Femoral neuropathy of right lower extremity 10/14/2018   Current Meds  Medication Sig  . ACCU-CHEK AVIVA PLUS test strip USE AS INSTRUCTED TO TEST BLOOD GLUCOSE  . amitriptyline (ELAVIL) 25 MG tablet TAKE 1 TABLET BY MOUTH EVERYDAY AT BEDTIME  . Blood Glucose Monitoring Suppl (ONE TOUCH ULTRA 2) w/Device KIT 1 kit by Does not apply route 2 (two) times daily.  . cyclobenzaprine (FLEXERIL) 10 MG tablet Take 1 tablet (10 mg total) by mouth 3 (three) times daily as needed for muscle spasms.  . dapagliflozin propanediol (FARXIGA) 10 MG TABS tablet Take 1/2 tablet daily  . gabapentin (NEURONTIN) 300 MG capsule Take 2 capsules (600 mg total) by mouth 3 (three) times daily.  . Lancets (ONETOUCH ULTRASOFT) lancets Use as instructed  . metFORMIN (GLUCOPHAGE) 1000 MG tablet Take 1 tablet (1,000 mg total) by mouth 2 (two) times daily with a meal.  . pravastatin (PRAVACHOL) 20 MG tablet Take 1 tablet (20 mg total) by mouth daily.  . [DISCONTINUED] gabapentin (NEURONTIN) 300 MG capsule TAKE 1 CAPSULE BY MOUTH THREE TIMES A  DAY  . [DISCONTINUED] HYDROcodone-acetaminophen (NORCO) 10-325 MG tablet Take 1 tablet by mouth every 8 (eight) hours as needed.  . [DISCONTINUED] methocarbamol (ROBAXIN) 500 MG tablet methocarbamol 500 mg tablet    Allergies: Patient is allergic to penicillins. Family History: Patient family history includes Diabetes in his brother, brother, and sister; Diabetes type I in his father; Healthy in his son and son; Heart attack in his father; Heart disease in his mother; Hypertension in his mother. Social History:  Patient  reports that he has been smoking cigarettes. He has never used smokeless tobacco. He  reports current alcohol use. He reports that he does not use drugs.  Review of Systems: Constitutional: Negative for fever malaise or anorexia Cardiovascular: negative for chest pain Respiratory: negative for SOB or persistent cough Gastrointestinal: negative for abdominal pain  Objective  Vitals: BP 118/74   Pulse 82   Temp 98.2 F (36.8 C) (Oral)   Resp 16   Ht 5' 9"  (1.753 m)   Wt 184 lb 6.4 oz (83.6 kg)   SpO2 98%   BMI 27.23 kg/m  General: no acute distress , A&Ox3 Back is nontender, Negative straight leg raise bilaterally Musculoskeletal: 4+/5 quad strength, 5/5 hamstrings and lower extremities.  Decreased and symmetric knee and Achilles reflexes bilaterally. Skin:  Warm, no rashes     Commons side effects, risks, benefits, and alternatives for medications and treatment plan prescribed today were discussed, and the patient expressed understanding of the given instructions. Patient is instructed to call or message via MyChart if he/she has any questions or concerns regarding our treatment plan. No barriers to understanding were identified. We discussed Red Flag symptoms and signs in detail. Patient expressed understanding regarding what to do in case of urgent or emergency type symptoms.   Medication list was reconciled, printed and provided to the patient in AVS. Patient instructions and summary information was reviewed with the patient as documented in the AVS. This note was prepared with assistance of Dragon voice recognition software. Occasional wrong-word or sound-a-like substitutions may have occurred due to the inherent limitations of voice recognition software

## 2018-12-17 NOTE — Patient Instructions (Signed)
Please follow up as scheduled for your next visit with me: 01/05/2019   If you have any questions or concerns, please don't hesitate to send me a message via MyChart or call the office at [phone]. Thank you for visiting with [name] today! It's our pleasure caring for you.  We will call you with information regarding your referral appointment. Neurology for their input.  If you do not hear from [name] within the next 2 weeks, please let me know. It can take 1-2 weeks to get appointments set up with the specialists.

## 2018-12-28 ENCOUNTER — Telehealth: Payer: Self-pay

## 2018-12-28 NOTE — Telephone Encounter (Signed)
I contacted the pt and advised due to covid-19 concerns our office is limiting the number of patient's coming into the clinic. Our office is providing video visit during this time to our new patients. The video visit will be billed through insurance and due to hippa concerns is not as secure as a face to face encounter. However, once a verbal consent is provided we can schedule this appointment for you.  : Pt agreed to video visit with Dr. Anne Hahn at 8:30 am.  appt schedule in webex and link for appointment sent to Dr. Anne Hahn and Mr. Daft.  Meds,pharmacy, allergies and history has been reviewed.

## 2018-12-29 ENCOUNTER — Other Ambulatory Visit: Payer: Self-pay | Admitting: Neurology

## 2018-12-29 ENCOUNTER — Other Ambulatory Visit: Payer: Self-pay

## 2018-12-29 ENCOUNTER — Encounter: Payer: Self-pay | Admitting: Neurology

## 2018-12-29 ENCOUNTER — Telehealth (INDEPENDENT_AMBULATORY_CARE_PROVIDER_SITE_OTHER): Payer: BLUE CROSS/BLUE SHIELD | Admitting: Neurology

## 2018-12-29 ENCOUNTER — Telehealth: Payer: Self-pay | Admitting: Neurology

## 2018-12-29 DIAGNOSIS — M79604 Pain in right leg: Secondary | ICD-10-CM

## 2018-12-29 MED ORDER — CARBAMAZEPINE 200 MG PO TABS: ORAL_TABLET | ORAL | 3 refills | Status: DC

## 2018-12-29 NOTE — Telephone Encounter (Signed)
Pt is calling PX:TGGYIRSWNIOEV (ELAVIL) 25 MG tablet this  was supposed to have been called into [id number]  By Dr Anne Hahn according to pt.  Pt states that he spoke with Dr Anne Hahn and that he was supposed to have called in 50mg  of this amitriptyline ([facility]IL) to [facility].  Pt is asking for a call to discuss why this has not been done.

## 2018-12-29 NOTE — Patient Instructions (Signed)
Begin carbamazepine 200 mg, take 1/2 tablet twice daily for 2 weeks and then go to 1 full tablet twice daily.

## 2018-12-29 NOTE — Telephone Encounter (Signed)
Pt called in for his refill of amitriptyline (ELAVIL) 25 MG tablet to be sent to the [facility] pharmacy on file

## 2018-12-29 NOTE — Progress Notes (Signed)
Virtual Visit via Telephone Note  I connected with Barry Schmidt on 12/29/18 at  8:30 AM EDT by telephone and verified that I am speaking with the correct person using two identifiers.   I discussed the limitations, risks, security and privacy concerns of performing an evaluation and management service by telephone and the availability of in person appointments. I also discussed with the patient that there may be a patient responsible charge related to this service. The patient expressed understanding and agreed to proceed.   History of Present Illness: Barry Schmidt is a 58 year old right-handed white male with a history of diabetes that appears to be well controlled, his most recent hemoglobin A1c was 6.1.  The patient had spontaneous onset of severe pain in the right lower back, buttock, and right thigh down to the knee that occurred in September or October 2019.  The patient felt that there was some weakness of the right leg as well.  He initially was seen by Dr. Darrelyn Hillock and MRI of the low back was done, he also had a CT scan done, apparently these studies did not offer any insight into the cause of his pain.  The patient did not note any skin rash associated with onset of the pain.  He had EMG nerve conduction study done on 31 August 2018, this apparently showed chronic neuropathic denervation consistent with a femoral neuropathy, no evidence of a lumbosacral radiculopathy was seen.  The patient denied any significant unexpected weight loss prior to the onset of the pain.  He did have a 15 pound weight loss between November and December, after the onset of the pain.  The patient had some improvement of discomfort, but the pain worsened in mid February 2020.  The patient now has discomfort in the back, hip area, down the right thigh and below the knee to the foot and ankle.  He has tingling paresthesias up to the groin area, he has weakness in the right leg, he has some difficulty getting up out of bed  or climbing stairs secondary to weakness.  He has not had any falls, occasionally he may feel his right knee collapse.  The patient has been treated with gabapentin, amitriptyline, and hydrocodone.  He was given Flexeril and a short course of prednisone in February.  He continues to have significant discomfort during the day, he has difficulty sleeping at night.  He denies issues controlling the bowels or the bladder.  He has not had any neck pain or arm discomfort or weakness or numbness of the arms, he has not had any discomfort or weakness or numbness of the left leg, he denies numbness or tingling sensations or dysesthesias in the feet.  On nerve conduction studies, there is no evidence of a generalized peripheral neuropathy.  He is sent here for evaluation of the ongoing discomfort.   Observations/Objective: On the telephone, the patient appears to be alert and cooperative, no dysarthria or aphasia was noted.  Assessment and Plan: 1.  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  The patient is having some weakness of the right leg.  We will obtain MRI results from Dr. Darrelyn Hillock.  The patient will be set up for further blood work evaluation.  He will be set up for EMG and nerve conduction study  evaluation again.  He will need a face-to-face examination within the next 2 months.  The patient will be placed on carbamazepine for pain, 200 mg tablets taking 1/2 tablet twice daily for 2 weeks and then go to 1 full tablet twice daily.  Follow Up Instructions: The patient will need a follow-up in 2 months.   I discussed the assessment and treatment plan with the patient. The patient was provided an opportunity to ask questions and all were answered. The patient agreed with the plan and  demonstrated an understanding of the instructions.   The patient was advised to call back or seek an in-person evaluation if the symptoms worsen or if the condition fails to improve as anticipated.  I provided 30 minutes of non-face-to-face time during this encounter.   York Spaniel, MD

## 2018-12-29 NOTE — Telephone Encounter (Signed)
I called the patient.  We had originally decided to go up on the amitriptyline to 50 mg at night but then later we discussed starting carbamazepine instead, we will keep the amitriptyline dose as is for now, we may go up on the dose later.

## 2018-12-31 ENCOUNTER — Other Ambulatory Visit (INDEPENDENT_AMBULATORY_CARE_PROVIDER_SITE_OTHER): Payer: Self-pay

## 2018-12-31 ENCOUNTER — Other Ambulatory Visit: Payer: Self-pay

## 2018-12-31 DIAGNOSIS — M79604 Pain in right leg: Secondary | ICD-10-CM | POA: Diagnosis not present

## 2018-12-31 DIAGNOSIS — Z0289 Encounter for other administrative examinations: Secondary | ICD-10-CM

## 2019-01-03 ENCOUNTER — Telehealth: Payer: Self-pay | Admitting: Neurology

## 2019-01-03 ENCOUNTER — Other Ambulatory Visit: Payer: Self-pay | Admitting: Family Medicine

## 2019-01-03 LAB — COMPREHENSIVE METABOLIC PANEL
ALT: 17 IU/L (ref 0–44)
AST: 15 IU/L (ref 0–40)
Albumin/Globulin Ratio: 2.4 — ABNORMAL HIGH (ref 1.2–2.2)
Albumin: 4.4 g/dL (ref 3.8–4.9)
Alkaline Phosphatase: 69 IU/L (ref 39–117)
BUN/Creatinine Ratio: 21 — ABNORMAL HIGH (ref 9–20)
BUN: 18 mg/dL (ref 6–24)
CHLORIDE: 101 mmol/L (ref 96–106)
CO2: 24 mmol/L (ref 20–29)
Calcium: 9.4 mg/dL (ref 8.7–10.2)
Creatinine, Ser: 0.87 mg/dL (ref 0.76–1.27)
GFR calc Af Amer: 112 mL/min/{1.73_m2} (ref >59)
GFR calc non Af Amer: 96 mL/min/{1.73_m2} (ref >59)
Globulin, Total: 1.8 g/dL (ref 1.5–4.5)
Glucose: 191 mg/dL — ABNORMAL HIGH (ref 65–99)
Potassium: 4.1 mmol/L (ref 3.5–5.2)
Sodium: 142 mmol/L (ref 134–144)
Total Protein: 6.2 g/dL (ref 6.0–8.5)

## 2019-01-03 LAB — PAN-ANCA
ANCA Proteinase 3: <3.5 U/mL (ref 0.0–3.5)
Atypical pANCA: <1:20 {titer}
C-ANCA: <1:20 {titer}
P-ANCA: <1:20 {titer}

## 2019-01-03 LAB — SEDIMENTATION RATE: Sed Rate: 4 mm/hr (ref 0–30)

## 2019-01-03 LAB — ANGIOTENSIN CONVERTING ENZYME: Angio Convert Enzyme: 68 U/L (ref 14–82)

## 2019-01-03 LAB — B. BURGDORFI ANTIBODIES: Lyme IgG/IgM Ab: <0.91 {ISR} (ref 0.00–0.90)

## 2019-01-03 LAB — CK: Total CK: 98 U/L (ref 24–204)

## 2019-01-03 LAB — HIV ANTIBODY (ROUTINE TESTING W REFLEX): HIV Screen 4th Generation wRfx: NONREACTIVE

## 2019-01-03 LAB — ANA W/REFLEX: Anti Nuclear Antibody (ANA): NEGATIVE

## 2019-01-03 NOTE — Telephone Encounter (Signed)
  MRI of the lumbar spine done at [facility], does not show any surgically amenable issues.   MRI lumbar June 25, 2018:  Impression:  1.  L4-L5: Small central and left foraminal protrusion.  The left L4 nerve root is contacted but not displaced. 2.  L5-S1: Small central protrusion indents the thecal sac, no nerve impingement.

## 2019-01-03 NOTE — Telephone Encounter (Signed)
Pt wants 90 day supply

## 2019-01-04 ENCOUNTER — Telehealth: Payer: Self-pay | Admitting: *Deleted

## 2019-01-04 NOTE — Telephone Encounter (Signed)
Spoke to pt and relayed that his lab work, results were unremarkable except for elevated BS of 191.  Pt stated that he did eat prior to having labs done. He stated not change in his sx, since taking the carbamazepine. He verbalized understanding.

## 2019-01-05 ENCOUNTER — Encounter: Payer: BLUE CROSS/BLUE SHIELD | Admitting: Family Medicine

## 2019-01-10 ENCOUNTER — Other Ambulatory Visit: Payer: Self-pay | Admitting: Family Medicine

## 2019-01-12 ENCOUNTER — Encounter: Payer: BLUE CROSS/BLUE SHIELD | Admitting: Family Medicine

## 2019-01-12 ENCOUNTER — Encounter: Payer: BLUE CROSS/BLUE SHIELD | Admitting: Neurology

## 2019-02-14 ENCOUNTER — Telehealth: Payer: Self-pay

## 2019-02-14 NOTE — Telephone Encounter (Signed)
I contacted the pt in regards to his 02/15/19 EMG appt. I advised, Due to current COVID 19 pandemic, our office is severely reducing in office visits until further notice, in order to minimize the risk to our patients and healthcare providers.   Pt was advised we are scheduled to start [facility] on 5/20. Pt states he will call back to reschedule.  When pt calls, please offer a EMG on 5/20 or after.

## 2019-02-15 ENCOUNTER — Encounter: Payer: Self-pay | Admitting: Neurology

## 2019-02-18 ENCOUNTER — Ambulatory Visit: Payer: Self-pay | Admitting: Neurology

## 2019-03-03 ENCOUNTER — Ambulatory Visit: Payer: BLUE CROSS/BLUE SHIELD | Admitting: Neurology

## 2019-03-27 ENCOUNTER — Other Ambulatory Visit: Payer: Self-pay | Admitting: Neurology

## 2019-03-28 ENCOUNTER — Telehealth: Payer: Self-pay

## 2019-03-28 NOTE — Telephone Encounter (Signed)
I called and spoke to pt, confirmed appt and advised him of the current chk in process.

## 2019-03-29 ENCOUNTER — Other Ambulatory Visit: Payer: Self-pay

## 2019-03-29 ENCOUNTER — Encounter: Payer: Self-pay | Admitting: Neurology

## 2019-03-29 ENCOUNTER — Ambulatory Visit (INDEPENDENT_AMBULATORY_CARE_PROVIDER_SITE_OTHER): Payer: BLUE CROSS/BLUE SHIELD | Admitting: Neurology

## 2019-03-29 DIAGNOSIS — M79604 Pain in right leg: Secondary | ICD-10-CM | POA: Diagnosis not present

## 2019-03-29 DIAGNOSIS — E1140 Type 2 diabetes mellitus with diabetic neuropathy, unspecified: Secondary | ICD-10-CM

## 2019-03-29 NOTE — Procedures (Signed)
     HISTORY:  Barry Schmidt is a 58 year old gentleman with a history of diabetes that was poorly controlled in September 2019, around that time he developed sudden onset of severe pain in the right leg.  This pain has continued but has improved some over time.  There was some question by EMG study previously of a mild right femoral neuropathy.  The patient returns for further evaluation.  NERVE CONDUCTION STUDIES:  Nerve conduction studies were performed on both lower extremities.  The distal motor latencies for the peroneal and posterior tibial nerves were normal bilaterally with a low motor amplitudes seen for the right posterior tibial nerve, normal for the left posterior tibial nerve and for the peroneal nerves bilaterally.  Slowing was seen for the peroneal and posterior tibial nerves bilaterally.  The sensory latencies for the peroneal nerves were prolonged bilaterally, normal for the sural nerves bilaterally.  The F-wave latencies for the posterior tibial nerves were prolonged bilaterally.  EMG STUDIES:  EMG study was performed on the right lower extremity:  The tibialis anterior muscle reveals 2 to 4K motor units with slightly decreased recruitment. 2+ fibrillations and positive waves were seen. The peroneus tertius muscle reveals 2 to 4K motor units with decreased recruitment. 2+ fibrillations and positive waves were seen. The medial gastrocnemius muscle reveals 1 to 3K motor units with full recruitment. No fibrillations or positive waves were seen. The vastus lateralis muscle reveals 2 to 4K motor units with full recruitment. No fibrillations or positive waves were seen. The iliopsoas muscle reveals 2 to 4K motor units with full recruitment. No fibrillations or positive waves were seen. The biceps femoris muscle (long head) reveals 2 to 4K motor units with full recruitment. No fibrillations or positive waves were seen. The lumbosacral paraspinal muscles were tested at 3 levels, and  revealed no abnormalities of insertional activity at all 3 levels tested. There was good relaxation.   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  EMG evaluation of the right lower extremity shows acute and chronic denervation in the peroneal nerve distribution in isolation, no evidence of an overlying lumbosacral radiculopathy or evidence of a femoral neuropathy as previously described.  A healing diabetic amyotrophy is a diagnostic possibility, clinical correlation is required.  Jill Alexanders MD 03/29/2019 4:40 PM  [facility] [address] [place], [place] [address]  Phone [phone] Fax [phone]

## 2019-03-29 NOTE — Progress Notes (Signed)
Please refer to EMG and nerve conduction procedure note.  

## 2019-03-29 NOTE — Progress Notes (Addendum)
The patient comes in today for EMG nerve conduction study.  The nerve conductions at this point show evidence of a generalized peripheral neuropathy may be related to diabetes.  This was not apparent on nerve conduction studies done in November 2019.  The current EMG study of the right lower extremity shows acute denervation in the distribution of the peroneal nerve.  There is no evidence of acute or chronic changes in the femoral nerve distribution as described on the November 2019 evaluation.  It is possible that the patient may have a healing diabetic amyotrophy.  The patient is getting benefit with the carbamazepine for the chronic pain issue.  He does not believe the gabapentin has helped much.  He will follow-up here in about 3 months.        [place]    Nerve / Sites Muscle Latency Ref. Amplitude Ref. Rel Amp Segments Distance Velocity Ref. Area    ms ms mV mV %  cm m/s m/s mVms  L Peroneal - EDB     Ankle EDB 5.9 ?6.5 2.1 ?2.0 100 Ankle - EDB 9   8.7     Fib head EDB 14.1  1.9  91.3 Fib head - Ankle 28 34 ?44 8.4     Pop fossa EDB 17.0  1.9  97.7 Pop fossa - Fib head 10 34 ?44 7.8         Pop fossa - Ankle      R Peroneal - EDB     Ankle EDB 6.3 ?6.5 3.0 ?2.0 100 Ankle - EDB 9   9.0     Fib head EDB 14.5  2.3  77.5 Fib head - Ankle 30 36 ?44 8.6     Pop fossa EDB 17.3  2.1  88.9 Pop fossa - Fib head 10 36 ?44 7.9         Pop fossa - Ankle      L Tibial - AH     Ankle AH 5.8 ?5.8 4.2 ?4.0 100 Ankle - AH 9   11.3     Pop fossa AH 17.7  3.3  76.9 Pop fossa - Ankle 39 33 ?41 11.3  R Tibial - AH     Ankle AH 5.6 ?5.8 2.7 ?4.0 100 Ankle - AH 9   6.8     Pop fossa AH 18.1  1.7  64.7 Pop fossa - Ankle 39 31 ?41 4.9             SNC    Nerve / Sites Rec. Site Peak Lat Ref.  Amp Ref. Segments Distance    ms ms V V  cm  L Sural - Ankle (Calf)     Calf Ankle 3.6 ?4.4 7 ?6 Calf - Ankle 14  R Sural - Ankle (Calf)     Calf Ankle 4.0 ?4.4 7 ?6 Calf - Ankle 14  L Superficial peroneal -  Ankle     Lat leg Ankle 5.5 ?4.4 3 ?6 Lat leg - Ankle 14  R Superficial peroneal - Ankle     Lat leg Ankle 5.2 ?4.4 4 ?6 Lat leg - Ankle 14              F  Wave    Nerve F Lat Ref.   ms ms  L Tibial - AH 60.9 ?56.0  R Tibial - AH 66.3 ?56.0

## 2019-03-30 ENCOUNTER — Telehealth: Payer: Self-pay | Admitting: Neurology

## 2019-03-30 NOTE — Progress Notes (Signed)
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

## 2019-03-30 NOTE — Telephone Encounter (Signed)
The prescription for carbamazepine was refilled 2 days ago.

## 2019-03-30 NOTE — Telephone Encounter (Signed)
90 day rx was sent on 03/28/19 for Carbamazepine. [facility] [place], confirmation received.

## 2019-03-30 NOTE — Telephone Encounter (Signed)
I contacted patient today to schedule his 3 month follow-up after his NCV/EMG that he had yesterday. We scheduled a follow-up for September. While on the phone, patient requested that Dr. Jannifer Franklin refill his prescription, and he advised that he is needing a 90-day supply.

## 2019-04-15 ENCOUNTER — Other Ambulatory Visit: Payer: Self-pay | Admitting: Family Medicine

## 2019-06-02 ENCOUNTER — Telehealth: Payer: Self-pay | Admitting: Family Medicine

## 2019-06-02 NOTE — Telephone Encounter (Signed)
Medication: metFORMIN (GLUCOPHAGE) 1000 MG tablet [[id number]] , FARXIGA 10 MG TABS tablet [[id number]]   Has the patient contacted their pharmacy? Yes  (Agent: If no, request that the patient contact the pharmacy for the refill.) (Agent: If yes, when and what did the pharmacy advise?)  Preferred Pharmacy (with phone number or street name): [id number] [place], [place] [phone] (Phone) [phone] (Fax)    Agent: Please be advised that RX refills may take up to 3 business days. We ask that you follow-up with your pharmacy.

## 2019-06-02 NOTE — Telephone Encounter (Signed)
Spoke to patient and advised he is overdue for a visit as well as labs.  I offered to make appt for him but he stated that he didn't know his work schedule and would have to c/b to schedule.  He also states that he will be out of his Metformin by the middle of next week.  Would you be able to send in a 30 day supply until he can get in for appt?  Please advise

## 2019-06-03 ENCOUNTER — Encounter: Payer: Self-pay | Admitting: Family Medicine

## 2019-06-03 ENCOUNTER — Other Ambulatory Visit: Payer: Self-pay

## 2019-06-03 ENCOUNTER — Ambulatory Visit (INDEPENDENT_AMBULATORY_CARE_PROVIDER_SITE_OTHER): Payer: BLUE CROSS/BLUE SHIELD | Admitting: Family Medicine

## 2019-06-03 VITALS — BP 118/74 | HR 65 | Temp 98.1°F | Ht 69.0 in | Wt 198.8 lb

## 2019-06-03 DIAGNOSIS — E1140 Type 2 diabetes mellitus with diabetic neuropathy, unspecified: Secondary | ICD-10-CM | POA: Diagnosis not present

## 2019-06-03 DIAGNOSIS — Z1159 Encounter for screening for other viral diseases: Secondary | ICD-10-CM | POA: Diagnosis not present

## 2019-06-03 DIAGNOSIS — Z23 Encounter for immunization: Secondary | ICD-10-CM | POA: Diagnosis not present

## 2019-06-03 DIAGNOSIS — Z79899 Other long term (current) drug therapy: Secondary | ICD-10-CM

## 2019-06-03 LAB — MICROALBUMIN / CREATININE URINE RATIO
Creatinine,U: 100 mg/dL
Microalb Creat Ratio: 0.7 mg/g (ref 0.0–30.0)
Microalb, Ur: <0.7 mg/dL (ref 0.0–1.9)

## 2019-06-03 LAB — COMPREHENSIVE METABOLIC PANEL
ALT: 23 U/L (ref 0–53)
AST: 16 U/L (ref 0–37)
Albumin: 4.6 g/dL (ref 3.5–5.2)
Alkaline Phosphatase: 76 U/L (ref 39–117)
BUN: 22 mg/dL (ref 6–23)
CO2: 25 mEq/L (ref 19–32)
Calcium: 9.4 mg/dL (ref 8.4–10.5)
Chloride: 104 mEq/L (ref 96–112)
Creatinine, Ser: 0.79 mg/dL (ref 0.40–1.50)
GFR: 101.27 mL/min (ref >60.00)
Glucose, Bld: 101 mg/dL — ABNORMAL HIGH (ref 70–99)
Potassium: 4.2 mEq/L (ref 3.5–5.1)
Sodium: 140 mEq/L (ref 135–145)
Total Bilirubin: 0.3 mg/dL (ref 0.2–1.2)
Total Protein: 6.9 g/dL (ref 6.0–8.3)

## 2019-06-03 LAB — CBC WITH DIFFERENTIAL/PLATELET
Basophils Absolute: 0 10*3/uL (ref 0.0–0.1)
Basophils Relative: 0.5 % (ref 0.0–3.0)
Eosinophils Absolute: 0.2 10*3/uL (ref 0.0–0.7)
Eosinophils Relative: 2.6 % (ref 0.0–5.0)
HCT: 44.7 % (ref 39.0–52.0)
Hemoglobin: 15.3 g/dL (ref 13.0–17.0)
Lymphocytes Relative: 26.2 % (ref 12.0–46.0)
Lymphs Abs: 1.5 10*3/uL (ref 0.7–4.0)
MCHC: 34.2 g/dL (ref 30.0–36.0)
MCV: 89 fl (ref 78.0–100.0)
Monocytes Absolute: 0.5 10*3/uL (ref 0.1–1.0)
Monocytes Relative: 8.9 % (ref 3.0–12.0)
Neutro Abs: 3.6 10*3/uL (ref 1.4–7.7)
Neutrophils Relative %: 61.8 % (ref 43.0–77.0)
Platelets: 202 10*3/uL (ref 150.0–400.0)
RBC: 5.03 Mil/uL (ref 4.22–5.81)
RDW: 13.2 % (ref 11.5–15.5)
WBC: 5.8 10*3/uL (ref 4.0–10.5)

## 2019-06-03 LAB — POCT GLYCOSYLATED HEMOGLOBIN (HGB A1C): Hemoglobin A1C: 6 % — AB (ref 4.0–5.6)

## 2019-06-03 LAB — LIPID PANEL
Cholesterol: 206 mg/dL — ABNORMAL HIGH (ref 0–200)
HDL: 54.9 mg/dL (ref >39.00)
LDL Cholesterol: 123 mg/dL — ABNORMAL HIGH (ref 0–99)
NonHDL: 150.61
Total CHOL/HDL Ratio: 4
Triglycerides: 137 mg/dL (ref 0.0–149.0)
VLDL: 27.4 mg/dL (ref 0.0–40.0)

## 2019-06-03 LAB — TSH: TSH: 5.85 u[IU]/mL — ABNORMAL HIGH (ref 0.35–4.50)

## 2019-06-03 MED ORDER — METFORMIN HCL 1000 MG PO TABS: 1000.0000 mg | ORAL_TABLET | Freq: Two times a day (BID) | ORAL | 3 refills | Status: DC

## 2019-06-03 MED ORDER — FARXIGA 10 MG PO TABS: 5.0000 mg | ORAL_TABLET | Freq: Every day | ORAL | 3 refills | Status: DC

## 2019-06-03 NOTE — Progress Notes (Signed)
Subjective  CC:  Chief Complaint  Patient presents with  . Diabetes    HPI: Barry Schmidt is a 58 y.o. male who presents to the office today for follow up of diabetes and problems listed above in the chief complaint.   Diabetes follow up: His diabetic control is reported as Unchanged. Does well. Has gained a little weight. No sxs of hyperglycemia.  He denies exertional CP or SOB or symptomatic hypoglycemia. He denies foot sores or paresthesias now on tegretol for recent emg/ncv showing peripheral neuroapthy; femoral neuropathy has resolved. He feels good. Due urine testing, labs, flu shot.   On statin for ascvd risk reduction and tolerating it well. nonfasting today.   No sxs of low bp on farxiga, low dose.  Wt Readings from Last 3 Encounters:  06/03/19 198 lb 12.8 oz (90.2 kg)  12/17/18 184 lb 6.4 oz (83.6 kg)  11/26/18 180 lb (81.6 kg)    BP Readings from Last 3 Encounters:  06/03/19 118/74  12/17/18 118/74  11/26/18 120/70    Assessment  1. Controlled type 2 diabetes mellitus with neuropathy ([place])   2. On statin therapy due to risk of future cardiovascular event   3. Need for hepatitis C screening test      Plan   Diabetes is currently very well controlled. Continue same. Flu shot today. rec q 6 month visits  Statin: check labs:lipids, cmp  Flu shot today.   Follow up: No follow-ups on file.. Orders Placed This Encounter  Procedures  . Comprehensive metabolic panel  . CBC with Differential/Platelet  . Lipid panel  . TSH  . Microalbumin / creatinine urine ratio  . Hepatitis C antibody  . POCT glycosylated hemoglobin (Hb A1C)   No orders of the defined types were placed in this encounter.     Immunization History  Administered Date(s) Administered  . Influenza,inj,Quad PF,6+ Mos 06/24/2018  . Pneumococcal Polysaccharide-23 10/06/2010  . Tdap 10/06/2012    Diabetes Related Lab Review: Lab Results  Component Value Date   HGBA1C 6.1 (A) 10/14/2018   HGBA1C 6.5 (A) 09/08/2018   HGBA1C 10.5 (A) 07/21/2018    Lab Results  Component Value Date   MICROALBUR <0.7 06/23/2018   Lab Results  Component Value Date   CREATININE 0.87 12/31/2018   BUN 18 12/31/2018   NA 142 12/31/2018   K 4.1 12/31/2018   CL 101 12/31/2018   CO2 24 12/31/2018   Lab Results  Component Value Date   CHOL 185 06/23/2018   Lab Results  Component Value Date   HDL 60.90 06/23/2018   No results found for: [facility] Lab Results  Component Value Date   TRIG (H) 06/23/2018    406.0 Triglyceride is over 400; calculations on Lipids are invalid.   Lab Results  Component Value Date   CHOLHDL 3 06/23/2018   Lab Results  Component Value Date   LDLDIRECT 90.0 06/23/2018   The 10-year ASCVD risk score Mikey Bussing DC Brooke Bonito., et al., 2013) is: 14%   Values used to calculate the score:     Age: 89 years     Sex: Male     Is Non-Hispanic African American: No     Diabetic: Yes     Tobacco smoker: Yes     Systolic Blood Pressure: 465 mmHg     Is BP treated: No     HDL Cholesterol: 60.9 mg/dL     Total Cholesterol: 185 mg/dL I have reviewed the PMH, Fam and Soc history.  Patient Active Problem List   Diagnosis Date Noted  . Controlled type 2 diabetes mellitus with neuropathy ([place]) 10/14/2018    Diagnosed in 2007; was morbidly obese at that time 260 pounds. He lost weight and did well.  Negative urine MAC 07/2018; not on ACE. Normotensive   . On statin therapy due to risk of future cardiovascular event 10/14/2018  . Femoral neuropathy of right lower extremity 10/14/2018    Social History: Patient  reports that he has been smoking cigarettes. He has never used smokeless tobacco. He reports current alcohol use. He reports that he does not use drugs.  Review of Systems: Ophthalmic: negative for eye pain, loss of vision or double vision Cardiovascular: negative for chest pain Respiratory: negative for SOB or persistent cough Gastrointestinal: negative for abdominal  pain Genitourinary: negative for dysuria or gross hematuria MSK: negative for foot lesions Neurologic: negative for weakness or gait disturbance  Objective  Vitals: BP 118/74 (BP Location: Left Arm, Patient Position: Sitting, Cuff Size: Normal)   Pulse 65   Temp 98.1 F (36.7 C) (Oral)   Ht _0  (1.753 m)   Wt 198 lb 12.8 oz (90.2 kg)   SpO2 96%   BMI 29.36 kg/m  General: well appearing, no acute distress  Psych:  Alert and oriented, normal mood and affect HEENT:  Normocephalic, atraumatic, moist mucous membranes, supple neck  Cardiovascular:  Nl S1 and S2, RRR without murmur, gallop or rub. no edema Respiratory:  Good breath sounds bilaterally, CTAB with normal effort, no rales Gastrointestinal: normal BS, soft, nontender Skin:  Warm, no rashes. 26m healing would on left lower leg with mild surrounding erythema. No pus or warmth. Neurologic:   Mental status is normal. normal gait Foot exam: no erythema, pallor, or cyanosis visible nl proprioception and sensation to monofilament testing bilaterally, +2 distal pulses bilaterally    Diabetic education: ongoing education regarding chronic disease management for diabetes was given today. We continue to reinforce the ABC's of diabetic management: A1c (<7 or 8 dependent upon patient), tight blood pressure control, and cholesterol management with goal LDL < 100 minimally. We discuss diet strategies, exercise recommendations, medication options and possible side effects. At each visit, we review recommended immunizations and preventive care recommendations for diabetics and stress that good diabetic control can prevent other problems. See below for this patient's data.    Commons side effects, risks, benefits, and alternatives for medications and treatment plan prescribed today were discussed, and the patient expressed understanding of the given instructions. Patient is instructed to call or message via MyChart if he/she has any questions or  concerns regarding our treatment plan. No barriers to understanding were identified. We discussed Red Flag symptoms and signs in detail. Patient expressed understanding regarding what to do in case of urgent or emergency type symptoms.   Medication list was reconciled, printed and provided to the patient in AVS. Patient instructions and summary information was reviewed with the patient as documented in the AVS. This note was prepared with assistance of Dragon voice recognition software. Occasional wrong-word or sound-a-like substitutions may have occurred due to the inherent limitations of voice recognition software

## 2019-06-03 NOTE — Patient Instructions (Addendum)
Please return in 6 months for your annual complete physical; please come fasting. And diabetes follow up.   Your diabetes remains well controlled. I've refilled your medications.  Please sign up for Mychart if possilbe. I will release your lab results to you on your MyChart account with further instructions. Please reply with any questions.    If you have any questions or concerns, please don't hesitate to send me a message via MyChart or call the office at [phone]. Thank you for visiting with [name] today! It's our pleasure caring for you.

## 2019-06-06 ENCOUNTER — Telehealth: Payer: Self-pay | Admitting: *Deleted

## 2019-06-06 LAB — HEPATITIS C ANTIBODY
Hepatitis C Ab: NONREACTIVE
SIGNAL TO CUT-OFF: 0.02 (ref <1.00)

## 2019-06-06 MED ORDER — ROSUVASTATIN CALCIUM 10 MG PO TABS: 10.0000 mg | ORAL_TABLET | Freq: Every day | ORAL | 3 refills | Status: DC

## 2019-06-06 NOTE — Progress Notes (Signed)
Please call patient: I have reviewed his/her lab results. Most everything looks good, however, cholesterol levels are worsening. LDL is now 130 (was 90); is he taking his pravastatin? If so, I'd like to change him to crestor 10mg  nightly to get cholesterol lowere again. As well, thyroid test was just a little off; will recheck that at his next visit as well. Can order crestor 10 nightly if he is willing to change. Need LDL < 70.

## 2019-06-07 NOTE — Telephone Encounter (Signed)
See note

## 2019-06-07 NOTE — Telephone Encounter (Signed)
Called pt, he is aware to call pharmacy to see why he only received 9 pills

## 2019-06-07 NOTE — Telephone Encounter (Signed)
Patient called in stating pharmacy only gave him 9 pills of this medication. Patient stated this is an unusual amount and is asking for clarification. Please advise.

## 2019-06-29 ENCOUNTER — Ambulatory Visit: Payer: BLUE CROSS/BLUE SHIELD | Admitting: Neurology

## 2019-06-30 ENCOUNTER — Other Ambulatory Visit: Payer: Self-pay | Admitting: Family Medicine

## 2019-10-14 ENCOUNTER — Encounter: Payer: Self-pay | Admitting: Family Medicine

## 2019-10-14 ENCOUNTER — Ambulatory Visit (INDEPENDENT_AMBULATORY_CARE_PROVIDER_SITE_OTHER): Payer: BC Managed Care – PPO | Admitting: Family Medicine

## 2019-10-14 ENCOUNTER — Other Ambulatory Visit: Payer: Self-pay

## 2019-10-14 ENCOUNTER — Telehealth: Payer: Self-pay

## 2019-10-14 VITALS — BP 122/68 | HR 65 | Temp 98.1°F | Ht 69.0 in | Wt 194.6 lb

## 2019-10-14 DIAGNOSIS — L239 Allergic contact dermatitis, unspecified cause: Secondary | ICD-10-CM | POA: Diagnosis not present

## 2019-10-14 DIAGNOSIS — G5721 Lesion of femoral nerve, right lower limb: Secondary | ICD-10-CM

## 2019-10-14 MED ORDER — TRIAMCINOLONE ACETONIDE 0.1 % EX CREA: 1.0000 "application " | TOPICAL_CREAM | Freq: Two times a day (BID) | CUTANEOUS | 0 refills | Status: DC

## 2019-10-14 MED ORDER — DOXYCYCLINE HYCLATE 100 MG PO TABS: 100.0000 mg | ORAL_TABLET | Freq: Two times a day (BID) | ORAL | 0 refills | Status: AC

## 2019-10-14 MED ORDER — CARBAMAZEPINE 200 MG PO TABS: ORAL_TABLET | ORAL | 1 refills | Status: DC

## 2019-10-14 NOTE — Patient Instructions (Signed)
Please follow up as scheduled for your next visit with me: 12/09/2019   If you have any questions or concerns, please don't hesitate to send me a message via MyChart or call the office at [phone]. Thank you for visiting with [name] today! It's our pleasure caring for you.

## 2019-10-14 NOTE — Telephone Encounter (Signed)
[facility].

## 2019-10-14 NOTE — Progress Notes (Signed)
Subjective  CC:  Chief Complaint  Patient presents with  . Rash on left ankle    started 3 weeks ago    HPI: Barry Schmidt is a 59 y.o. male who presents to the office today to address the problems listed above in the chief complaint.  Diabetic complains of itching on inside of left ankle about 3 weeks ago.  He remembers scratching area raw due to severe itching one night while in bed.  Awoke with redness on the inner ankle.  Since redness has increased, become more blotchy to blister areas.  Denies pain, heat, pus, systemic symptoms.  No new identified allergens or known bites.  No tick bites.  No dryness or flaking.  No history of eczema.  Itching comes and goes but is not severe.  He has been using Neosporin twice a day for the last 3 weeks without improvement. Assessment  1. Allergic dermatitis      Plan   Allergic dermatitis plus minus secondary infection from scratching: Steroid cream twice a day and add doxycycline to cover for possible infection.  Uncertain trigger, could have been a bite, allergic irritant or other.  No red flag symptoms noted.  Follow-up if unimproved.  Follow up: Return for as scheduled.  12/09/2019  No orders of the defined types were placed in this encounter.  Meds ordered this encounter  Medications  . triamcinolone cream (KENALOG) 0.1 %    Sig: Apply 1 application topically 2 (two) times daily. For 1- 2 weeks    Dispense:  30 g    Refill:  0  . doxycycline (VIBRA-TABS) 100 MG tablet    Sig: Take 1 tablet (100 mg total) by mouth 2 (two) times daily for 7 days.    Dispense:  14 tablet    Refill:  0      I reviewed the patients updated PMH, FH, and SocHx.    Patient Active Problem List   Diagnosis Date Noted  . Controlled type 2 diabetes mellitus with neuropathy ([place]) 10/14/2018  . On statin therapy due to risk of future cardiovascular event 10/14/2018  . Femoral neuropathy of right lower extremity 10/14/2018   Current Meds  Medication Sig    . ACCU-CHEK AVIVA PLUS test strip USE AS INSTRUCTED TO TEST BLOOD GLUCOSE  . amitriptyline (ELAVIL) 25 MG tablet TAKE 1 TABLET BY MOUTH EVERYDAY AT BEDTIME  . Blood Glucose Monitoring Suppl (ONE TOUCH ULTRA 2) w/Device KIT 1 kit by Does not apply route 2 (two) times daily.  . carbamazepine (TEGRETOL) 200 MG tablet TAKE 1/2 TABLET TWICE DAILY FOR 2 WEEKS AND THEN TAKE 1 FULL TABLET TWICE DAILY  . dapagliflozin propanediol (FARXIGA) 10 MG TABS tablet Take 5 mg by mouth daily.  . Lancets (ONETOUCH ULTRASOFT) lancets Use as instructed  . metFORMIN (GLUCOPHAGE) 1000 MG tablet Take 1 tablet (1,000 mg total) by mouth 2 (two) times daily with a meal.  . rosuvastatin (CRESTOR) 10 MG tablet Take 1 tablet (10 mg total) by mouth daily.    Allergies: Patient is allergic to penicillins. Family History: Patient family history includes Diabetes in his brother, brother, and sister; Diabetes type I in his father; Healthy in his son and son; Heart attack in his father; Heart disease in his mother; Hypertension in his mother. Social History:  Patient  reports that he has been smoking cigarettes. He has never used smokeless tobacco. He reports current alcohol use. He reports that he does not use drugs.  Review of Systems:  Constitutional: Negative for fever malaise or anorexia Cardiovascular: negative for chest pain Respiratory: negative for SOB or persistent cough Gastrointestinal: negative for abdominal pain  Objective  Vitals: BP 122/68 (BP Location: Right Arm, Patient Position: Sitting, Cuff Size: Normal)   Pulse 65   Temp 98.1 F (36.7 C) (Temporal)   Ht _0  (1.753 m)   Wt 194 lb 9.6 oz (88.3 kg)   SpO2 95%   BMI 28.74 kg/m  General: no acute distress , A&Ox3 Inner left ankle with approximately 3 cm macular rash with 2 healing blistered areas centrally located.  No warmth or tenderness present.  No fluctuance.  No flaking. Pedal pulses are +2     Commons side effects, risks, benefits, and  alternatives for medications and treatment plan prescribed today were discussed, and the patient expressed understanding of the given instructions. Patient is instructed to call or message via MyChart if he/she has any questions or concerns regarding our treatment plan. No barriers to understanding were identified. We discussed Red Flag symptoms and signs in detail. Patient expressed understanding regarding what to do in case of urgent or emergency type symptoms.   Medication list was reconciled, printed and provided to the patient in AVS. Patient instructions and summary information was reviewed with the patient as documented in the AVS. This note was prepared with assistance of Dragon voice recognition software. Occasional wrong-word or sound-a-like substitutions may have occurred due to the inherent limitations of voice recognition software  This visit occurred during the SARS-CoV-2 public health emergency.  Safety protocols were in place, including screening questions prior to the visit, additional usage of staff PPE, and extensive cleaning of exam room while observing appropriate contact time as indicated for disinfecting solutions.

## 2019-10-14 NOTE — Telephone Encounter (Signed)
Patient is requesting a refill for carbamazepine [facility] [place], [place]

## 2019-11-25 ENCOUNTER — Ambulatory Visit (INDEPENDENT_AMBULATORY_CARE_PROVIDER_SITE_OTHER): Payer: BC Managed Care – PPO | Admitting: Family Medicine

## 2019-11-25 ENCOUNTER — Ambulatory Visit (INDEPENDENT_AMBULATORY_CARE_PROVIDER_SITE_OTHER): Payer: BC Managed Care – PPO

## 2019-11-25 ENCOUNTER — Other Ambulatory Visit: Payer: Self-pay

## 2019-11-25 ENCOUNTER — Encounter: Payer: Self-pay | Admitting: Family Medicine

## 2019-11-25 VITALS — BP 134/68 | HR 79 | Temp 98.2°F | Ht 69.0 in | Wt 209.4 lb

## 2019-11-25 DIAGNOSIS — M7582 Other shoulder lesions, left shoulder: Secondary | ICD-10-CM

## 2019-11-25 DIAGNOSIS — M25512 Pain in left shoulder: Secondary | ICD-10-CM | POA: Diagnosis not present

## 2019-11-25 MED ORDER — TRIAMCINOLONE ACETONIDE 40 MG/ML IJ SUSP
40.0000 mg | Freq: Once | INTRAMUSCULAR | Status: AC
  Administered 2019-11-25: 15:00:00 40 mg via INTRAMUSCULAR

## 2019-11-25 NOTE — Progress Notes (Signed)
Subjective  CC:  Chief Complaint  Patient presents with  . Shoulder Pain    Left shoulder pain. started a couple of weeks ago. 4/10 pain now. unable to sleep in the left side    HPI: Barry Schmidt is a 59 y.o. male who presents to the office today to address the problems listed above in the chief complaint.  59 yo reports several month history of mild left shoulder problem: mild soreness at times and pain with certain mvts like putting on his coat. However, over the last month or so it has worsened and for the last 2 weeks the pain has been moderate to severe and constant. Now with very limited use of the arm due to pain. With certain mvts, pain will shoot down his arm. No weakness. No neck pain. No injury but lifts a lot at work. 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    Assessment  1. Rotator cuff tendinitis, left      Plan   Shoulder pain:  Suspect overuse tendonitis, RC and/or bicep. S/p steroid injection today. RICE, xray and recheck in 2 weeks.   Follow up: Return for recheck shoulder pain and daibetes follow up, as scheduled.  12/09/2019  Orders Placed This Encounter  Procedures  . DG Shoulder Left   Meds ordered this encounter  Medications  . triamcinolone acetonide (KENALOG-40) injection 40 mg      I reviewed the patients updated PMH, FH, and SocHx.    Patient Active Problem List   Diagnosis Date Noted  . Controlled type 2 diabetes mellitus with neuropathy ([place]) 10/14/2018  . On statin therapy due to risk of future cardiovascular event 10/14/2018  . Femoral neuropathy of right lower extremity 10/14/2018   Current Meds  Medication Sig  . ACCU-CHEK AVIVA PLUS test strip USE AS INSTRUCTED TO TEST BLOOD GLUCOSE  . amitriptyline (ELAVIL) 25 MG tablet TAKE 1 TABLET BY MOUTH EVERYDAY AT BEDTIME  . Blood Glucose Monitoring Suppl (ONE TOUCH ULTRA 2) w/Device KIT 1 kit by Does not apply route 2 (two) times daily.  .  carbamazepine (TEGRETOL) 200 MG tablet TAKE 1/2 TABLET TWICE DAILY FOR 2 WEEKS AND THEN TAKE 1 FULL TABLET TWICE DAILY  . dapagliflozin propanediol (FARXIGA) 10 MG TABS tablet Take 5 mg by mouth daily.  . Lancets (ONETOUCH ULTRASOFT) lancets Use as instructed  . metFORMIN (GLUCOPHAGE) 1000 MG tablet Take 1 tablet (1,000 mg total) by mouth 2 (two) times daily with a meal.  . rosuvastatin (CRESTOR) 10 MG tablet Take 1 tablet (10 mg total) by mouth daily.    Allergies: Patient is allergic to penicillins. Family History: Patient family history includes Diabetes in his brother, brother, and sister; Diabetes type I in his father; Healthy in his son and son; Heart attack in his father; Heart disease in his mother; Hypertension in his mother. Social History:  Patient  reports that he has been smoking cigarettes. He has never used smokeless tobacco. He reports current alcohol use. He reports that he does not use drugs.  Review of Systems: Constitutional: Negative for fever malaise or anorexia Cardiovascular: negative for chest pain Respiratory: negative for SOB or persistent cough Gastrointestinal: negative for abdominal pain  Objective  Vitals: BP 134/68 (BP Location: Right Arm, Patient Position: Sitting, Cuff Size: Normal)   Pulse 79   Temp 98.2 F (36.8 C) (Temporal)   Ht 5' 9" (1.753 m)   Wt 209 lb 6.4 oz (95  kg)   SpO2 95%   BMI 30.92 kg/m  General: appears uncomfortable , A&Ox3 Left shoulder: + subacromial ttp; limited passive and active abduction due to pain. Nl bicep and wrist and grip strength. Neg drop test.  Shoulder Injection Procedure Note  Pre-operative Diagnosis: left shoulder RCT  Post-operative Diagnosis: same  Indications: Symptomatic relief and failed conservative measures  Anesthesia: Lidocaine 1% without epinephrine without added sodium bicarbonate  Procedure Details   Verbal consent was obtained for the procedure. The shoulder was prepped with alcohol  and  the skin was anesthetized with cold spray. Using a 22 gauge needle the subacromial space was injected with 4 mL 1% lidocaine and 1 mL of triamcinolone (KENALOG) 7m/ml under the posterior aspect of the acromion. The injection site was cleansed with topical isopropyl alcohol and a dressing was applied.  Complications:  None; patient tolerated the procedure well.    Commons side effects, risks, benefits, and alternatives for medications and treatment plan prescribed today were discussed, and the patient expressed understanding of the given instructions. Patient is instructed to call or message via MyChart if he/she has any questions or concerns regarding our treatment plan. No barriers to understanding were identified. We discussed Red Flag symptoms and signs in detail. Patient expressed understanding regarding what to do in case of urgent or emergency type symptoms.   Medication list was reconciled, printed and provided to the patient in AVS. Patient instructions and summary information was reviewed with the patient as documented in the AVS. This note was prepared with assistance of Dragon voice recognition software. Occasional wrong-word or sound-a-like substitutions may have occurred due to the inherent limitations of voice recognition software  This visit occurred during the SARS-CoV-2 public health emergency.  Safety protocols were in place, including screening questions prior to the visit, additional usage of staff PPE, and extensive cleaning of exam room while observing appropriate contact time as indicated for disinfecting solutions.

## 2019-11-25 NOTE — Patient Instructions (Addendum)
Please follow up as scheduled for your next visit with me: 12/09/2019   If you have any questions or concerns, please don't hesitate to send me a message via MyChart or call the office at [phone]. Thank you for visiting with [name] today! It's our pleasure caring for you.  You had a steroid injection today.   Things to be aware of after this injection are listed below:  You may experience no significant improvement or even a slight worsening in your symptoms during the first 24 to 48 hours.  After that we expect your symptoms to improve gradually over the next 2 weeks for the medicine to have its maximal effect.  You should continue to have improvement out to 6 weeks after your injection.  I recommend icing the site of the injection for 20 minutes  1-2 times the day of your injection  You may shower but no swimming, tub bath or Jacuzzi for 24 hours.  If your bandage falls off this does not need to be replaced.  It is appropriate to remove the bandage after 4 hours.  You may resume light activities as tolerated.     POSSIBLE PROCEDURE SIDE EFFECTS: The side effects of the injection are usually fairly minimal however if you may experience some of the following side effects that are usually self-limited and will is off on their own.  If you are concerned please feel free to call the office with questions:             Increased numbness or tingling             Nausea or vomiting             Swelling or bruising at the injection site    Please call our office if if you experience any of the following symptoms over the next week as these can be signs of infection:              Fever greater than 100.49F             Significant swelling at the injection site             Significant redness or drainage from the injection site     Shoulder Range of Motion Exercises Shoulder range of motion (ROM) exercises are done to keep the shoulder moving freely or to increase movement. They are often  recommended for people who have shoulder pain or stiffness or who are recovering from a shoulder surgery. Phase 1 exercises When you are able, do this exercise 1-2 times per day for 30-60 seconds in each direction, or as directed by your health care provider. Pendulum exercise To do this exercise while sitting: 1. Sit in a chair or at the edge of your bed with your feet flat on the floor. 2. Let your affected arm hang down in front of you over the edge of the bed or chair. 3. Relax your shoulder, arm, and hand. [place] your body so your arm gently swings in small circles. You can also use your unaffected arm to start the motion. 5. Repeat changing the direction of the circles, swinging your arm left and right, and swinging your arm forward and back. To do this exercise while standing: 1. Stand next to a sturdy chair or table, and hold on to it with your hand on your unaffected side. 2. Bend forward at the waist. 3. Bend your knees slightly. 4. Relax your shoulder, arm, and hand. 5.  While keeping your shoulder relaxed, use body motion to swing your arm in small circles. 6. Repeat changing the direction of the circles, swinging your arm left and right, and swinging your arm forward and back. 7. Between exercises, stand up tall and take a short break to relax your lower back.  Phase 2 exercises Do these exercises 1-2 times per day or as told by your health care provider. Hold each stretch for 30 seconds, and repeat 3 times. Do the exercises with one or both arms as instructed by your health care provider. For these exercises, sit at a table with your hand and arm supported by the table. A chair that slides easily or has wheels can be helpful. External rotation 1. Turn your chair so that your affected side is nearest to the table. 2. Place your forearm on the table to your side. Bend your elbow about 90 at the elbow (right angle) and place your hand palm facing down on the table. Your elbow  should be about 6 inches away from your side. 3. Keeping your arm on the table, lean your body forward. Abduction 1. Turn your chair so that your affected side is nearest to the table. 2. Place your forearm and hand on the table so that your thumb points toward the ceiling and your arm is straight out to your side. 3. Slide your hand out to the side and away from you, using your unaffected arm to do the work. 4. To increase the stretch, you can slide your chair away from the table. Flexion: forward stretch 1. Sit facing the table. Place your hand and elbow on the table in front of you. 2. Slide your hand forward and away from you, using your unaffected arm to do the work. 3. To increase the stretch, you can slide your chair backward. Phase 3 exercises Do these exercises 1-2 times per day or as told by your health care provider. Hold each stretch for 30 seconds, and repeat 3 times. Do the exercises with one or both arms as instructed by your health care provider. Cross-body stretch: posterior capsule stretch 1. Lift your arm straight out in front of you. 2. Bend your arm 90 at the elbow (right angle) so your forearm moves across your body. 3. Use your other arm to gently pull the elbow across your body, toward your other shoulder. Wall climbs 1. Stand with your affected arm extended out to the side with your hand resting on a door frame. 2. Slide your hand slowly up the door frame. 3. To increase the stretch, step through the door frame. Keep your body upright and do not lean. Wand exercises You will need a cane, a piece of PVC pipe, or a sturdy wooden dowel for wand exercises. Flexion To do this exercise while standing: 1. Hold the wand with both of your hands, palms down. 2. Using the other arm to help, lift your arms up and over your head, if able. 3. Push upward with your other arm to gently increase the stretch. To do this exercise while lying down: 1. Lie on your back with your  elbows resting on the floor and the wand in both your hands. Your hands will be palm down, or pointing toward your feet. 2. Lift your hands toward the ceiling, using your unaffected arm to help if needed. 3. Bring your arms overhead as able, using your unaffected arm to help if needed. Internal rotation 1. Stand while holding the wand behind you  with both hands. Your unaffected arm should be extended above your head with the arm of the affected side extended behind you at the level of your waist. The wand should be pointing straight up and down as you hold it. 2. Slowly pull the wand up behind your back by straightening the elbow of your unaffected arm and bending the elbow of your affected arm. External rotation 1. Lie on your back with your affected upper arm supported on a small pillow or rolled towel. When you first do this exercise, keep your upper arm close to your body. Over time, bring your arm up to a 90 angle out to the side. 2. Hold the wand across your stomach and with both hands palm up. Your elbow on your affected side should be bent at a 90 angle. 3. Use your unaffected side to help push your forearm away from you and toward the floor. Keep your elbow on your affected side bent at a 90 angle. Contact a health care provider if you have:  New or increasing pain.  New numbness, tingling, weakness, or discoloration in your arm or hand. This information is not intended to replace advice given to you by your health care provider. Make sure you discuss any questions you have with your health care provider. Document Revised: 11/04/2017 Document Reviewed: 11/04/2017 Elsevier Patient Education  2020 [employer].

## 2019-11-28 ENCOUNTER — Telehealth: Payer: Self-pay | Admitting: Family Medicine

## 2019-11-28 DIAGNOSIS — M7582 Other shoulder lesions, left shoulder: Secondary | ICD-10-CM

## 2019-11-28 MED ORDER — TRAMADOL HCL 50 MG PO TABS: 50.0000 mg | ORAL_TABLET | Freq: Two times a day (BID) | ORAL | 0 refills | Status: DC | PRN

## 2019-11-28 NOTE — Telephone Encounter (Signed)
Please call patient: I ordered tramadol, a strong pain medication to help his pain. Have referred him to ortho for further evaluation due to persistent worsening symptoms.

## 2019-11-28 NOTE — Telephone Encounter (Signed)
Patient aware that medication sent to pharmacy. Appt on Thurs with ortho

## 2019-11-28 NOTE — Addendum Note (Signed)
Addended by: Asencion Partridge on: 11/28/2019 12:03 PM   Modules accepted: Orders

## 2019-11-28 NOTE — Telephone Encounter (Signed)
Please advise with any recommendations.

## 2019-11-28 NOTE — Progress Notes (Signed)
Please call patient: I have reviewed his/her lab results. Xray shows small cyst in humerus. Findings are consistent with rotator cuff tendon issue as we suspected. Follow up as directed.

## 2019-11-28 NOTE — Telephone Encounter (Addendum)
Patient called back.  I have given him Dr. Modesta Messing response to his X Ray results.    Patient states that he was taking 3 ibuprofen's twice a day and that he is still in a lot of pain.   Also, states that he rolled over onto his shoulder last night in bed.  States that when he did this he had pain shoot down whole arm and across chest.  States this lasted for about an hour making it difficult to breathe.  Patient states the pain he is having this morning is the same as when he came into the office to be seen.    Please follow up in regard with patient.

## 2019-12-01 ENCOUNTER — Ambulatory Visit: Payer: Self-pay

## 2019-12-01 ENCOUNTER — Ambulatory Visit (INDEPENDENT_AMBULATORY_CARE_PROVIDER_SITE_OTHER): Payer: BC Managed Care – PPO | Admitting: Orthopaedic Surgery

## 2019-12-01 ENCOUNTER — Other Ambulatory Visit: Payer: Self-pay

## 2019-12-01 ENCOUNTER — Encounter: Payer: Self-pay | Admitting: Orthopaedic Surgery

## 2019-12-01 DIAGNOSIS — M7502 Adhesive capsulitis of left shoulder: Secondary | ICD-10-CM

## 2019-12-01 NOTE — Progress Notes (Signed)
Office Visit Note   Patient: Barry Schmidt           Date of Birth: Nov 17, 1960           MRN: [id number] Visit Date: 12/01/2019              Requested by: [name], [place],   [id number] PCP: [name], MD   Assessment & Plan: Visit Diagnoses:  1. Adhesive capsulitis of left shoulder     Plan: Impression is left shoulder adhesive capsulitis with underlying rotator cuff tendinitis.  We will refer the patient to Dr. Junius Roads for a glenohumeral cortisone injection.  We will also start him in formal physical therapy.  Internal referral has been made.  Follow-up with [name] in 4 weeks time for recheck.  Call with concerns or questions.  Follow-Up Instructions: Return in about 4 weeks (around 12/29/2019).   Orders:  Orders Placed This Encounter  Procedures  . US Guided Needle Placement - No Linked Charges  . Ambulatory referral to Physical Therapy   No orders of the defined types were placed in this encounter.     Procedures: No procedures performed   Clinical Data: No additional findings.   Subjective: Chief Complaint  Patient presents with  . Left Shoulder - Pain    HPI patient is a pleasant 59 year old gentleman who presents to our clinic today with left shoulder pain.  This began a few months ago and his worsened over the past 2 weeks.  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  The pain he has is primarily into the deltoid and radiates into the hand at times.  He denies any pain at rest.  The pain he has is worse with any movement of the shoulder but specifically with abduction is when he is trying to put on deodorant.  He has taken tramadol with mild relief of symptoms.  No numbness, tingling or burning.  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   Of note, he is a type II diabetic.  Review of Systems as detailed in HPI.  All others reviewed and are negative.   Objective: Vital Signs: There were no vitals taken for this visit.  Physical Exam well-developed well-nourished gentleman in no acute distress.  Alert oriented x3.  Ortho Exam examination of his left shoulder reveals forward flexion to about 90 degrees.  He can externally rotate to about 30 degrees.  Internal rotation not quite to his back pocket.  Positive empty can and cross body abduction.  He has good strength throughout.  He is neurovascularly intact distally.  Specialty Comments:  No specialty comments available.  Imaging: X-rays reviewed by me in canopy reveal moderate AC space narrowing.  No superior migration humeral head.   PMFS History: Patient Active Problem List   Diagnosis Date Noted  . Controlled type 2 diabetes mellitus with neuropathy ([place]) 10/14/2018  . On statin therapy due to risk of future cardiovascular event 10/14/2018  . Femoral neuropathy of right lower extremity 10/14/2018   Past Medical History:  Diagnosis Date  . Diabetes mellitus without complication ([place])   . On statin therapy due to risk of future cardiovascular event 10/14/2018  . Uncontrolled diabetes mellitus without complication, with long-term current use of insulin 06/23/2018  Diagnosed in 2007; was morbidly obese at that time 260 pounds. He lost weight and did well.     Family History  Problem Relation Age of Onset  . Heart disease Mother        PTCA with stent  . Hypertension Mother   . Heart attack Father   . Diabetes type I Father   . Diabetes Brother   . Diabetes Sister   . Diabetes Brother   . Healthy Son   . Healthy Son     History reviewed. No pertinent surgical history. Social History   Occupational History  . Occupation: Production designer, theatre/television/film, Airline pilot: [employer]  Tobacco Use  . Smoking status: Current Every Day Smoker    Types: Cigarettes  . Smokeless tobacco:  Never Used  . Tobacco comment: 3-4 q day   Substance and Sexual Activity  . Alcohol use: Yes    Comment: Occastional   . Drug use: Never  . Sexual activity: Yes    Partners: Female

## 2019-12-01 NOTE — Progress Notes (Signed)
Subjective: Patient is here for ultrasound-guided intra-articular left glenohumeral injection.  Gradual onset of pain over the past couple months with progressive stiffness in his shoulder.  Subacromial injection did not help.  Objective: He has adhesive capsulitis with abduction 45 degrees, external rotation about 20 and internal rotation about 45.  Procedure: Ultrasound-guided left glenohumeral injection: After sterile prep with Betadine, injected 8 cc 1% lidocaine without epinephrine and 40 mg methylprednisolone using a 22-gauge spinal needle, passing the needle through approach into the glenohumeral joint.  Injectate seen filling the joint capsule.

## 2019-12-08 ENCOUNTER — Other Ambulatory Visit: Payer: Self-pay

## 2019-12-09 ENCOUNTER — Ambulatory Visit (INDEPENDENT_AMBULATORY_CARE_PROVIDER_SITE_OTHER): Payer: BC Managed Care – PPO | Admitting: Family Medicine

## 2019-12-09 ENCOUNTER — Encounter: Payer: Self-pay | Admitting: Family Medicine

## 2019-12-09 VITALS — BP 112/70 | HR 61 | Temp 97.7°F | Ht 69.0 in | Wt 195.0 lb

## 2019-12-09 DIAGNOSIS — E1140 Type 2 diabetes mellitus with diabetic neuropathy, unspecified: Secondary | ICD-10-CM

## 2019-12-09 DIAGNOSIS — M7502 Adhesive capsulitis of left shoulder: Secondary | ICD-10-CM | POA: Diagnosis not present

## 2019-12-09 DIAGNOSIS — G5721 Lesion of femoral nerve, right lower limb: Secondary | ICD-10-CM | POA: Diagnosis not present

## 2019-12-09 DIAGNOSIS — Z0000 Encounter for general adult medical examination without abnormal findings: Secondary | ICD-10-CM | POA: Diagnosis not present

## 2019-12-09 DIAGNOSIS — Z79899 Other long term (current) drug therapy: Secondary | ICD-10-CM

## 2019-12-09 LAB — CBC WITH DIFFERENTIAL/PLATELET
Basophils Absolute: 0 10*3/uL (ref 0.0–0.1)
Basophils Relative: 0.5 % (ref 0.0–3.0)
Eosinophils Absolute: 0.2 10*3/uL (ref 0.0–0.7)
Eosinophils Relative: 2.6 % (ref 0.0–5.0)
HCT: 42.1 % (ref 39.0–52.0)
Hemoglobin: 14.5 g/dL (ref 13.0–17.0)
Lymphocytes Relative: 23.5 % (ref 12.0–46.0)
Lymphs Abs: 1.4 10*3/uL (ref 0.7–4.0)
MCHC: 34.4 g/dL (ref 30.0–36.0)
MCV: 88 fl (ref 78.0–100.0)
Monocytes Absolute: 0.6 10*3/uL (ref 0.1–1.0)
Monocytes Relative: 9.3 % (ref 3.0–12.0)
Neutro Abs: 3.9 10*3/uL (ref 1.4–7.7)
Neutrophils Relative %: 64.1 % (ref 43.0–77.0)
Platelets: 182 10*3/uL (ref 150.0–400.0)
RBC: 4.79 Mil/uL (ref 4.22–5.81)
RDW: 13.3 % (ref 11.5–15.5)
WBC: 6.1 10*3/uL (ref 4.0–10.5)

## 2019-12-09 LAB — COMPREHENSIVE METABOLIC PANEL
ALT: 17 U/L (ref 0–53)
AST: 15 U/L (ref 0–37)
Albumin: 4.4 g/dL (ref 3.5–5.2)
Alkaline Phosphatase: 84 U/L (ref 39–117)
BUN: 17 mg/dL (ref 6–23)
CO2: 30 mEq/L (ref 19–32)
Calcium: 9.3 mg/dL (ref 8.4–10.5)
Chloride: 100 mEq/L (ref 96–112)
Creatinine, Ser: 0.88 mg/dL (ref 0.40–1.50)
GFR: 89.25 mL/min (ref >60.00)
Glucose, Bld: 106 mg/dL — ABNORMAL HIGH (ref 70–99)
Potassium: 4.2 mEq/L (ref 3.5–5.1)
Sodium: 139 mEq/L (ref 135–145)
Total Bilirubin: 0.4 mg/dL (ref 0.2–1.2)
Total Protein: 6.7 g/dL (ref 6.0–8.3)

## 2019-12-09 LAB — MICROALBUMIN / CREATININE URINE RATIO
Creatinine,U: 111.7 mg/dL
Microalb Creat Ratio: 0.6 mg/g (ref 0.0–30.0)
Microalb, Ur: <0.7 mg/dL (ref 0.0–1.9)

## 2019-12-09 LAB — LIPID PANEL
Cholesterol: 137 mg/dL (ref 0–200)
HDL: 61.1 mg/dL (ref >39.00)
LDL Cholesterol: 62 mg/dL (ref 0–99)
NonHDL: 75.73
Total CHOL/HDL Ratio: 2
Triglycerides: 71 mg/dL (ref 0.0–149.0)
VLDL: 14.2 mg/dL (ref 0.0–40.0)

## 2019-12-09 LAB — POCT GLYCOSYLATED HEMOGLOBIN (HGB A1C): Hemoglobin A1C: 6 % — AB (ref 4.0–5.6)

## 2019-12-09 MED ORDER — TRAMADOL HCL 50 MG PO TABS: 50.0000 mg | ORAL_TABLET | Freq: Two times a day (BID) | ORAL | 0 refills | Status: DC | PRN

## 2019-12-09 NOTE — Patient Instructions (Addendum)
Please return in 6 months for diabetes recheck.   If you have any questions or concerns, please don't hesitate to send me a message via MyChart or call the office at [phone]. Thank you for visiting with [name] today! It's our pleasure caring for you.  Please set up an appointment for a diabetic eye exam and have the results sent to me.  [facility] on [employer] does a good job.  Please do these things to maintain good health!   Exercise at least 30-45 minutes a day,  4-5 days a week.   Eat a low-fat diet with lots of fruits and vegetables, up to 7-9 servings per day.  Drink plenty of water daily. Try to drink 8 8oz glasses per day.  Seatbelts can save your life. Always wear your seatbelt.  Place Smoke Detectors on every level of your home and check batteries every year.  Eye Doctor - have an eye exam every 1-2 years  Safe sex - use condoms to protect yourself from STDs if you could be exposed to these types of infections.  Avoid heavy alcohol use. If you drink, keep it to less than 2 drinks/day and not every day.  Health Care Power of Attorney.  Choose someone you trust that could speak for you if you became unable to speak for yourself.  Depression is common in our stressful world.If you're feeling down or losing interest in things you normally enjoy, please come in for a visit.

## 2019-12-09 NOTE — Progress Notes (Signed)
Please call patient: I have reviewed his/her lab results. All labs look great. Cholesterol is now perfect as well.

## 2019-12-09 NOTE — Progress Notes (Signed)
Subjective  Chief Complaint  Patient presents with  . Annual Exam    fasting. no new concerns  . Diabetes    checks DM three times weekly  . Hyperlipidemia    takes rosuvastatin daily    HPI: Barry Schmidt is a 59 y.o. male who presents to [facility] today for a Male Wellness Visit. He also has the concerns and/or needs as listed above in the chief complaint. These will be addressed in addition to the Health Maintenance Visit.   Wellness Visit: annual visit with health maintenance review and exam    HM: doing well. Eating healthy diet. Weight is down. Mood is good; home life and work life are stable. imms up to date. CRC screen up to date.  Lifestyle: Body mass index is 28.8 kg/m. Wt Readings from Last 3 Encounters:  12/09/19 195 lb (88.5 kg)  11/25/19 209 lb 6.4 oz (95 kg)  10/14/19 194 lb 9.6 oz (88.3 kg)    Chronic disease management visit and/or acute problem visit:  DM: has been well controlled. On low dose farxiga and metformin. No sxs of high or low blood sugars. Due for eye exam: hasn't been able to get in with his regular MD due to covid back up. No foot concerns. No ace due to low bp and neg urine microalbumin ratios. On statin. imms up to date  Fem neuropathy: now well controlled on elavil and tegretol. No AEs.   Seeing ortho for adhesive capsulitis: s/p ultrasound guided steroid injection and feeling much better. Uses prn tramadol at night. Has f/u scheduled.   On statin and well tolerated. Fasting for lab work today .  Patient Active Problem List   Diagnosis Date Noted  . Controlled type 2 diabetes mellitus with neuropathy (HCC) 10/14/2018  . On statin therapy due to risk of future cardiovascular event 10/14/2018  . Femoral neuropathy of right lower extremity 10/14/2018   Health Maintenance  Topic Date Due  . OPHTHALMOLOGY EXAM  11/03/2019  . URINE MICROALBUMIN  06/02/2020  . HEMOGLOBIN A1C  06/10/2020  . FOOT EXAM  12/08/2020   . COLONOSCOPY  10/06/2022  . TETANUS/TDAP  10/06/2022  . INFLUENZA VACCINE  Completed  . PNEUMOCOCCAL POLYSACCHARIDE VACCINE AGE 20-64 HIGH RISK  Completed  . Hepatitis C Screening  Completed  . HIV Screening  Completed   Immunization History  Administered Date(s) Administered  . Influenza,inj,Quad PF,6+ Mos 06/24/2018, 06/03/2019  . Pneumococcal Polysaccharide-23 10/06/2010  . Tdap 10/06/2012   We updated and reviewed the patient's past history in detail and it is documented below. Allergies: Patient is allergic to penicillins. Past Medical History  has a past medical history of Diabetes mellitus without complication (HCC), On statin therapy due to risk of future cardiovascular event (10/14/2018), and Uncontrolled diabetes mellitus without complication, with long-term current use of insulin (06/23/2018). Past Surgical History Patient  has no past surgical history on file. Social History Patient  reports that he has never smoked. He has never used smokeless tobacco. He reports current alcohol use. He reports that he does not use drugs. Family History family history includes Diabetes in his brother, brother, and sister; Diabetes type I in his father; Healthy in his son and son; Heart attack in his father; Heart disease in his mother; Hypertension in his mother. Review of Systems: Constitutional: negative for fever or malaise Ophthalmic: negative for photophobia, double vision or loss of vision Cardiovascular: negative for chest pain, dyspnea on exertion, or new LE  swelling Respiratory: negative for SOB or persistent cough Gastrointestinal: negative for abdominal pain, change in bowel habits or melena Genitourinary: negative for dysuria or gross hematuria Musculoskeletal: negative for new gait disturbance or muscular weakness Integumentary: negative for new or persistent rashes Neurological: negative for TIA or stroke symptoms Psychiatric: negative for SI or  delusions Allergic/Immunologic: negative for hives  Patient Care Team    Relationship Specialty Notifications Start End  [name], MD PCP - General Family Medicine  06/23/18   Kathrynn Ducking, MD Consulting Physician Neurology  06/03/19   Latanya Maudlin, MD Consulting Physician Orthopedic Surgery  06/03/19    Objective  Vitals: BP 112/70 (BP Location: Right Arm, Patient Position: Sitting, Cuff Size: Normal)   Pulse 61   Temp 97.7 F (36.5 C) (Temporal)   Ht 5\' 9"  (1.753 m)   Wt 195 lb (88.5 kg)   SpO2 93%   BMI 28.80 kg/m  General:  Well developed, well nourished, no acute distress  Psych:  Alert and orientedx3,normal mood and affect HEENT:  Normocephalic, atraumatic, non-icteric sclera, PERRL, oropharynx is clear without mass or exudate, supple neck without adenopathy, mass or thyromegaly Cardiovascular:  Normal S1, S2, RRR without gallop, rub or murmur, nondisplaced PMI, +2 distal pulses in bilateral upper and lower extremities. Respiratory:  Good breath sounds bilaterally, CTAB with normal respiratory effort Gastrointestinal: normal bowel sounds, soft, non-tender, no noted masses. No HSM MSK: no deformities, contusions. Joints are without erythema or swelling. Spine and CVA region are nontender, decreased abduction left shoulder Skin:  Warm, no rashes or suspicious lesions noted Neurologic:    Mental status is normal.  Gross motor and sensory exams are normal. Stable gait. No tremor GU: No inguinal hernias or adenopathy are appreciated bilaterally Diabetic Foot Exam - Simple   Simple Foot Form Diabetic Foot exam was performed with the following findings: Yes 12/09/2019  8:44 AM  Visual Inspection No deformities, no ulcerations, no other skin breakdown bilaterally: Yes Sensation Testing Intact to touch and monofilament testing bilaterally: Yes Pulse Check Posterior Tibialis and Dorsalis pulse intact bilaterally: Yes Comments       Assessment  1. Annual physical exam    2. Controlled type 2 diabetes mellitus with neuropathy ([place])   3. On statin therapy due to risk of future cardiovascular event   4. Adhesive capsulitis of left shoulder   5. Femoral neuropathy of right lower extremity      Plan  Male Wellness Visit:  Age appropriate Health Maintenance and Prevention measures were discussed with patient. Included topics are cancer screening recommendations, ways to keep healthy (see AVS) including dietary and exercise recommendations, regular eye and dental care, use of seat belts, and avoidance of moderate alcohol use and tobacco use.   BMI: discussed patient's BMI and encouraged positive lifestyle modifications to help get to or maintain a target BMI.  HM needs and immunizations were addressed and ordered. See below for orders. See HM and immunization section for updates.  Routine labs and screening tests ordered including cmp, cbc and lipids where appropriate.  Discussed recommendations regarding Vit D and calcium supplementation (see AVS)  Chronic disease f/u and/or acute problem visit: (deemed necessary to be done in addition to the wellness visit):  DM: excellent control continue same meds. Pt to schedule diabetic eye exam.   Continue statin and check lipids and lfts today  Continue therapy for shoulder capsulitis. Improved. Prn tramadol refilled. Short term use  Neuropathy on chronic meds: controlled.   Follow up:  Return in about 6 months (around 06/10/2020) for follow up Diabetes.   Commons side effects, risks, benefits, and alternatives for medications and treatment plan prescribed today were discussed, and the patient expressed understanding of the given instructions. Patient is instructed to call or message via MyChart if he/she has any questions or concerns regarding our treatment plan. No barriers to understanding were identified. We discussed Red Flag symptoms and signs in detail. Patient expressed understanding regarding what to do in  case of urgent or emergency type symptoms.   Medication list was reconciled, printed and provided to the patient in AVS. Patient instructions and summary information was reviewed with the patient as documented in the AVS. This note was prepared with assistance of Dragon voice recognition software. Occasional wrong-word or sound-a-like substitutions may have occurred due to the inherent limitations of voice recognition software  This visit occurred during the SARS-CoV-2 public health emergency.  Safety protocols were in place, including screening questions prior to the visit, additional usage of staff PPE, and extensive cleaning of exam room while observing appropriate contact time as indicated for disinfecting solutions.   Orders Placed This Encounter  Procedures  . CBC with Differential/Platelet  . Comprehensive metabolic panel  . Lipid panel  . Microalbumin / creatinine urine ratio  . POCT glycosylated hemoglobin (Hb A1C)   Meds ordered this encounter  Medications  . traMADol (ULTRAM) 50 MG tablet    Sig: Take 1 tablet (50 mg total) by mouth 2 (two) times daily as needed.    Dispense:  30 tablet    Refill:  0

## 2019-12-16 LAB — HM DIABETES EYE EXAM

## 2019-12-20 ENCOUNTER — Encounter: Payer: Self-pay | Admitting: Family Medicine

## 2019-12-23 ENCOUNTER — Other Ambulatory Visit: Payer: Self-pay | Admitting: Family Medicine

## 2020-01-05 ENCOUNTER — Ambulatory Visit (INDEPENDENT_AMBULATORY_CARE_PROVIDER_SITE_OTHER): Payer: BC Managed Care – PPO | Admitting: Orthopaedic Surgery

## 2020-01-05 ENCOUNTER — Other Ambulatory Visit: Payer: Self-pay | Admitting: Physician Assistant

## 2020-01-05 ENCOUNTER — Encounter: Payer: Self-pay | Admitting: Physician Assistant

## 2020-01-05 ENCOUNTER — Other Ambulatory Visit: Payer: Self-pay

## 2020-01-05 ENCOUNTER — Telehealth: Payer: Self-pay | Admitting: Orthopaedic Surgery

## 2020-01-05 DIAGNOSIS — M7502 Adhesive capsulitis of left shoulder: Secondary | ICD-10-CM | POA: Diagnosis not present

## 2020-01-05 MED ORDER — HYDROCODONE-ACETAMINOPHEN 5-325 MG PO TABS: 1.0000 | ORAL_TABLET | Freq: Every day | ORAL | 0 refills | Status: DC | PRN

## 2020-01-05 MED ORDER — DICLOFENAC SODIUM 75 MG PO TBEC: 75.0000 mg | DELAYED_RELEASE_TABLET | Freq: Two times a day (BID) | ORAL | 1 refills | Status: DC

## 2020-01-05 NOTE — Telephone Encounter (Signed)
We decided to do norco instead of tylenol 3 for now and that was sent in.  I just added an nsaid and sent that in as well

## 2020-01-05 NOTE — Telephone Encounter (Signed)
Patient aware.

## 2020-01-05 NOTE — Progress Notes (Signed)
Office Visit Note   Patient: Barry Schmidt           Date of Birth: June 25, 1961           MRN: [id number] Visit Date: 01/05/2020              Requested by: Willow Ora, MD [address] [place],  [place] [address] PCP: Willow Ora, MD   Assessment & Plan: Visit Diagnoses:  1. Adhesive capsulitis of left shoulder     Plan: Impression is continued left shoulder pain concerning for adhesive capsulitis vs rotator cuff pathology.  We will order an MRI of the left shoulder at this point to further assess for structural abnormalities.  He will follow-up with [name] once has been completed.  Follow-Up Instructions: Return for after MRI.   Orders:  Orders Placed This Encounter  Procedures  . MR Shoulder Left w/o contrast   Meds ordered this encounter  Medications  . HYDROcodone-acetaminophen (NORCO) 5-325 MG tablet    Sig: Take 1-2 tablets by mouth daily as needed for moderate pain.    Dispense:  10 tablet    Refill:  0      Procedures: No procedures performed   Clinical Data: No additional findings.   Subjective: Chief Complaint  Patient presents with  . Left Shoulder - Pain    HPI patient is a pleasant 59 year old gentleman who comes in today with continued left shoulder pain.  He was seen by [name] little over 5 weeks ago with left shoulder adhesive capsulitis and possible rotator cuff tendinitis.  He had failed subacromial cortisone injection and was sent to Dr. [name] for glenohumeral cortisone injection.  This did seem to help a little but not quite enough.  His pain has returned and is started to worsen.  He still has fairly limited range of motion.  No recent MRI of the left shoulder.     Objective: Vital Signs: There were no vitals taken for this visit.    Ortho Exam stable left shoulder exam  Specialty Comments:  No specialty comments available.  Imaging: No new imaging   PMFS History: Patient Active Problem List   Diagnosis Date Noted  .  Controlled type 2 diabetes mellitus with neuropathy (HCC) 10/14/2018  . On statin therapy due to risk of future cardiovascular event 10/14/2018  . Femoral neuropathy of right lower extremity 10/14/2018   Past Medical History:  Diagnosis Date  . Diabetes mellitus without complication (HCC)   . On statin therapy due to risk of future cardiovascular event 10/14/2018  . Uncontrolled diabetes mellitus without complication, with long-term current use of insulin 06/23/2018   Diagnosed in 2007; was morbidly obese at that time 260 pounds. He lost weight and did well.     Family History  Problem Relation Age of Onset  . Heart disease Mother        PTCA with stent  . Hypertension Mother   . Heart attack Father   . Diabetes type I Father   . Diabetes Brother   . Diabetes Sister   . Diabetes Brother   . Healthy Son   . Healthy Son     History reviewed. No pertinent surgical history. Social History   Occupational History  . Occupation: Production designer, theatre/television/film, Airline pilot: [employer]  Tobacco Use  . Smoking status: Never Smoker  . Smokeless tobacco: Never Used  Substance and Sexual Activity  . Alcohol use: Yes    Comment: Occastional   .  Drug use: Never  . Sexual activity: Yes    Partners: Female

## 2020-01-05 NOTE — Telephone Encounter (Signed)
Pt called in said he came in today for an appt with lindsay and was told 2 medications would be sent to his pharmacy but pt said he only got the medication hydrocodone and the tylenol #3 still needs to be sent.    [phone]

## 2020-01-09 ENCOUNTER — Telehealth: Payer: Self-pay | Admitting: Orthopaedic Surgery

## 2020-01-09 NOTE — Telephone Encounter (Signed)
Patient needs the MRI order to be changed to both shoulders.  The current order is only for the left shoulder.  CB#[phone].  Thank you.

## 2020-01-10 NOTE — Telephone Encounter (Signed)
We can order mri of right shoulder also, but there is a chance they will deny since it was not a detailed visit with xrays, etc like the left shoulder

## 2020-01-10 NOTE — Telephone Encounter (Signed)
Xu, We have only seen him for left shoulder.  Want to have him come in for right shoulder eval?

## 2020-01-10 NOTE — Telephone Encounter (Signed)
If his right shoulder is also bothering him and wants an mri, he needs to be seen for this first

## 2020-01-10 NOTE — Telephone Encounter (Signed)
He states he mentioned this last visit that he had some popping on right shoulder just like the left shoulder. He is upset about that. And states he does not want to come in and pay another $400.    Left shoulder  MRI scheduled for 02/03/2020.

## 2020-01-10 NOTE — Telephone Encounter (Signed)
yes

## 2020-01-12 ENCOUNTER — Other Ambulatory Visit: Payer: Self-pay

## 2020-01-12 ENCOUNTER — Telehealth: Payer: Self-pay

## 2020-01-12 DIAGNOSIS — G8929 Other chronic pain: Secondary | ICD-10-CM

## 2020-01-12 DIAGNOSIS — M25511 Pain in right shoulder: Secondary | ICD-10-CM

## 2020-01-12 NOTE — Telephone Encounter (Signed)
Made order mri right shoulder. Left shoulder MRI sched for sat.

## 2020-01-12 NOTE — Telephone Encounter (Signed)
Would like a RF on Hydrocodone. Pharm: [facility] [place]   MRI is scheduled for SAT for left shoulder.

## 2020-01-13 ENCOUNTER — Other Ambulatory Visit: Payer: Self-pay | Admitting: Physician Assistant

## 2020-01-13 MED ORDER — ACETAMINOPHEN-CODEINE #3 300-30 MG PO TABS: 1.0000 | ORAL_TABLET | Freq: Three times a day (TID) | ORAL | 0 refills | Status: DC | PRN

## 2020-01-13 NOTE — Telephone Encounter (Signed)
We cannot refill narcotic, but I can call in tramadol or tylenol 3

## 2020-01-13 NOTE — Telephone Encounter (Signed)
Called patient. Requested Tylenol #3

## 2020-01-13 NOTE — Telephone Encounter (Signed)
Sent in

## 2020-01-14 ENCOUNTER — Ambulatory Visit
Admission: RE | Admit: 2020-01-14 | Discharge: 2020-01-14 | Disposition: A | Discharge status: Home / Self Care | Payer: BC Managed Care – PPO | Source: Ambulatory Visit | Attending: Physician Assistant | Admitting: Physician Assistant

## 2020-01-14 ENCOUNTER — Other Ambulatory Visit: Payer: Self-pay

## 2020-01-14 DIAGNOSIS — M7502 Adhesive capsulitis of left shoulder: Secondary | ICD-10-CM

## 2020-01-14 DIAGNOSIS — M19012 Primary osteoarthritis, left shoulder: Secondary | ICD-10-CM | POA: Diagnosis not present

## 2020-01-16 NOTE — Progress Notes (Signed)
F/u to discuss

## 2020-01-19 ENCOUNTER — Other Ambulatory Visit: Payer: BC Managed Care – PPO

## 2020-01-19 ENCOUNTER — Encounter: Payer: Self-pay | Admitting: Orthopaedic Surgery

## 2020-01-19 ENCOUNTER — Other Ambulatory Visit: Payer: Self-pay

## 2020-01-19 ENCOUNTER — Ambulatory Visit (INDEPENDENT_AMBULATORY_CARE_PROVIDER_SITE_OTHER): Payer: BC Managed Care – PPO | Admitting: Orthopaedic Surgery

## 2020-01-19 ENCOUNTER — Ambulatory Visit: Payer: Self-pay

## 2020-01-19 DIAGNOSIS — M7502 Adhesive capsulitis of left shoulder: Secondary | ICD-10-CM | POA: Diagnosis not present

## 2020-01-19 NOTE — Progress Notes (Signed)
Subjective: Patient is here for ultrasound-guided intra-articular left glenohumeral injection.  Persistent adhesive capsulitis with rotator cuff tendinopathy but no tear requiring surgery.  Some improvement after first glenohumeral injection but not a lot.  Objective: Very limited active and passive range of motion of his shoulder.  Procedure: Ultrasound-guided left glenohumeral injection: After sterile prep with Betadine, injected 8 cc 1% lidocaine without epinephrine and 40 mg methylprednisolone using a 22-gauge spinal needle, passing the needle through approach into the glenohumeral joint.  Injectate seen filling the joint capsule.  Follow-up as directed.

## 2020-01-19 NOTE — Progress Notes (Signed)
Office Visit Note   Patient: Barry Schmidt           Date of Birth: September 30, 1961           MRN: [id number] Visit Date: 01/19/2020              Requested by: Barry Ora, MD [address] [place],  [place] [address] PCP: Barry Ora, MD   Assessment & Plan: Visit Diagnoses:  1. Adhesive capsulitis of left shoulder     Plan: Impression is continued left shoulder adhesive capsulitis.  We reviewed the MRI with the patient and it shows some rotator cuff tendinopathy and biceps tendinopathy but also edema within the axillary recess consistent with adhesive capsulitis.  Clinically he is certainly presenting like adhesive capsulitis.  Therefore based on the MRI findings and discussion of treatment options we will repeat cortisone injection today and continue with home exercises versus outpatient PT.  Recheck in 6 weeks.  Follow-Up Instructions: Return in about 6 weeks (around 03/01/2020).   Orders:  Orders Placed This Encounter  Procedures  . US Guided Needle Placement - No Linked Charges   No orders of the defined types were placed in this encounter.     Procedures: No procedures performed   Clinical Data: No additional findings.   Subjective: Chief Complaint  Patient presents with  . Left Shoulder - Pain    Barry Schmidt returns today for follow-up of left adhesive capsulitis.  He states that no significant improvement has occurred since we last saw him.  He had a glenohumeral injection in February with Dr. Prince Schmidt.  He has not been to formal outpatient physical therapy.  He states that the pain has been too excruciating.   Review of Systems  Constitutional: Negative.   All other systems reviewed and are negative.    Objective: Vital Signs: There were no vitals taken for this visit.  Physical Exam Vitals and nursing note reviewed.  Constitutional:      Appearance: He is well-developed.  Pulmonary:     Effort: Pulmonary effort is normal.  Abdominal:   Palpations: Abdomen is soft.  Skin:    General: Skin is warm.  Neurological:     Mental Status: He is alert and oriented to person, place, and time.  Psychiatric:        Behavior: Behavior normal.        Thought Content: Thought content normal.        Judgment: Judgment normal.     Ortho Exam Left shoulder shows significant limitation in range of motion all planes with moderate to severe pain. Specialty Comments:  No specialty comments available.  Imaging: No results found.   PMFS History: Patient Active Problem List   Diagnosis Date Noted  . Adhesive capsulitis of left shoulder 01/19/2020  . Controlled type 2 diabetes mellitus with neuropathy (HCC) 10/14/2018  . On statin therapy due to risk of future cardiovascular event 10/14/2018  . Femoral neuropathy of right lower extremity 10/14/2018   Past Medical History:  Diagnosis Date  . Diabetes mellitus without complication (HCC)   . On statin therapy due to risk of future cardiovascular event 10/14/2018  . Uncontrolled diabetes mellitus without complication, with long-term current use of insulin 06/23/2018   Diagnosed in 2007; was morbidly obese at that time 260 pounds. He lost weight and did well.     Family History  Problem Relation Age of Onset  . Heart disease Mother        PTCA  with stent  . Hypertension Mother   . Heart attack Father   . Diabetes type I Father   . Diabetes Brother   . Diabetes Sister   . Diabetes Brother   . Healthy Son   . Healthy Son     History reviewed. No pertinent surgical history. Social History   Occupational History  . Occupation: Freight forwarder, Academic librarian: [employer]  Tobacco Use  . Smoking status: Never Smoker  . Smokeless tobacco: Never Used  Substance and Sexual Activity  . Alcohol use: Yes    Comment: Occastional   . Drug use: Never  . Sexual activity: Yes    Partners: Female

## 2020-02-03 ENCOUNTER — Other Ambulatory Visit: Payer: BC Managed Care – PPO

## 2020-03-06 ENCOUNTER — Other Ambulatory Visit: Payer: Self-pay | Admitting: Physician Assistant

## 2020-03-12 ENCOUNTER — Other Ambulatory Visit: Payer: Self-pay | Admitting: Physician Assistant

## 2020-03-12 ENCOUNTER — Encounter: Payer: Self-pay | Admitting: Physician Assistant

## 2020-03-12 ENCOUNTER — Other Ambulatory Visit: Payer: Self-pay

## 2020-03-12 ENCOUNTER — Other Ambulatory Visit: Payer: BC Managed Care – PPO

## 2020-03-12 ENCOUNTER — Other Ambulatory Visit: Payer: Self-pay | Admitting: Family Medicine

## 2020-03-12 ENCOUNTER — Telehealth (INDEPENDENT_AMBULATORY_CARE_PROVIDER_SITE_OTHER): Payer: BC Managed Care – PPO | Admitting: Physician Assistant

## 2020-03-12 ENCOUNTER — Ambulatory Visit (HOSPITAL_COMMUNITY)
Admission: RE | Admit: 2020-03-12 | Discharge: 2020-03-12 | Disposition: A | Discharge status: Home / Self Care | Payer: BC Managed Care – PPO | Source: Ambulatory Visit | Attending: Physician Assistant | Admitting: Physician Assistant

## 2020-03-12 DIAGNOSIS — R519 Headache, unspecified: Secondary | ICD-10-CM

## 2020-03-12 DIAGNOSIS — T50Z95A Adverse effect of other vaccines and biological substances, initial encounter: Secondary | ICD-10-CM

## 2020-03-12 MED ORDER — ONDANSETRON HCL 4 MG PO TABS: 4.0000 mg | ORAL_TABLET | Freq: Three times a day (TID) | ORAL | 0 refills | Status: DC | PRN

## 2020-03-12 NOTE — Addendum Note (Signed)
Addended by: Cash Meadow M on: 03/12/2020 04:17 PM   Modules accepted: Orders  

## 2020-03-12 NOTE — Addendum Note (Signed)
Addended by: Dyann Kief on: 03/12/2020 04:17 PM   Modules accepted: Orders

## 2020-03-12 NOTE — Addendum Note (Signed)
Addended by: Jewell Ryans M on: 03/12/2020 04:17 PM   Modules accepted: Orders  

## 2020-03-12 NOTE — Telephone Encounter (Signed)
MEDICATION: acetaminophen-codeine (TYLENOL #3) 300-30 MG tablet  PHARMACY:  [id number] - [place], [place] - [address] Phone:  [phone]  Fax:  [phone]       Comments:   **Let patient know to contact pharmacy at the end of the day to make sure medication is ready. **  ** Please notify patient to allow 48-72 hours to process**  **Encourage patient to contact the pharmacy for refills or they can request refills through [facility]**

## 2020-03-12 NOTE — Progress Notes (Signed)
TELEPHONE ENCOUNTER   Patient verbally agreed to telephone visit and is aware that copayment and coinsurance may apply. Patient was treated using telemedicine according to accepted telemedicine protocols.  Location of the patient: Home Location of provider: [place] of all persons participating in the telemedicine service and role in the encounter: Inda Coke, [place] , Barry Schmidt (patient)  Subjective:   Chief Complaint  Patient presents with   Covid Vaccine     Patients mentioned that he has been having persistent headaches, cold sweats, tenderness to inejction site since May 28 th     HPI   Patient reports that he had the [employer] vaccine on May 26.  His symptoms started on May 28.  He has had an unrelenting headache that is typically a 9/10 in the frontal portion of his head.  He is taking Tylenol around-the-clock without any improvement of symptoms.  He denies blurred vision, slurred speech, weakness on one side of the body.  He does endorse dizziness with standing and feeling lightheaded when he wakes up.  When he has the symptoms they do make him stumble.  He has not fallen.  He has not had any fevers. Having hot and cold spells.  He does have tenderness to his injection site but denies redness, significant tenderness, drainage from site.  Denies any recent medication changes.  Takes his medications appropriately.  Does have history of diabetes. Blood sugar was 187 after eating breakfast, when he felt off.  Patient Active Problem List   Diagnosis Date Noted   Adhesive capsulitis of left shoulder 01/19/2020   Controlled type 2 diabetes mellitus with neuropathy ([place]) 10/14/2018   On statin therapy due to risk of future cardiovascular event 10/14/2018   Femoral neuropathy of right lower extremity 10/14/2018   Social History   Tobacco Use   Smoking status: Never Smoker   Smokeless tobacco: Never Used  Substance Use Topics    Alcohol use: Yes    Comment: Occastional     Current Outpatient Medications:    ACCU-CHEK AVIVA PLUS test strip, USE AS INSTRUCTED TO TEST BLOOD GLUCOSE, Disp: 100 each, Rfl: 12   amitriptyline (ELAVIL) 25 MG tablet, TAKE 1 TABLET BY MOUTH EVERYDAY AT BEDTIME, Disp: 90 tablet, Rfl: 1   aspirin EC 81 MG tablet, Take 81 mg by mouth daily., Disp: , Rfl:    Blood Glucose Monitoring Suppl (ONE TOUCH ULTRA 2) w/Device KIT, 1 kit by Does not apply route 2 (two) times daily., Disp: 1 each, Rfl: 0   carbamazepine (TEGRETOL) 200 MG tablet, TAKE 1/2 TABLET TWICE DAILY FOR 2 WEEKS AND THEN TAKE 1 FULL TABLET TWICE DAILY, Disp: 180 tablet, Rfl: 1   dapagliflozin propanediol (FARXIGA) 10 MG TABS tablet, Take 5 mg by mouth daily., Disp: 45 tablet, Rfl: 3   diclofenac (VOLTAREN) 75 MG EC tablet, Take 1 tablet (75 mg total) by mouth 2 (two) times daily., Disp: 60 tablet, Rfl: 1   Lancets (ONETOUCH ULTRASOFT) lancets, Use as instructed, Disp: 100 each, Rfl: 12   metFORMIN (GLUCOPHAGE) 1000 MG tablet, Take 1 tablet (1,000 mg total) by mouth 2 (two) times daily with a meal., Disp: 180 tablet, Rfl: 3   rosuvastatin (CRESTOR) 10 MG tablet, Take 1 tablet (10 mg total) by mouth daily., Disp: 90 tablet, Rfl: 3   traMADol (ULTRAM) 50 MG tablet, Take 1 tablet (50 mg total) by mouth 2 (two) times daily as needed., Disp: 30 tablet, Rfl: 0  acetaminophen-codeine (TYLENOL #3) 300-30 MG tablet, Take 1 tablet by mouth every 8 (eight) hours as needed for moderate pain. (Patient not taking: Reported on 03/12/2020), Disp: 30 tablet, Rfl: 0   HYDROcodone-acetaminophen (NORCO) 5-325 MG tablet, Take 1-2 tablets by mouth daily as needed for moderate pain. (Patient not taking: Reported on 03/12/2020), Disp: 10 tablet, Rfl: 0 Allergies  Allergen Reactions   Penicillins Hives    Assessment & Plan:   1. Vaccine reaction, initial encounter   2. Intractable headache, unspecified chronicity pattern, unspecified headache type     New onset and unusual headache presentation for patient.  Will obtain stat head CT as well as lab work-up for possible, but highly rare, vaccine-induced immune thrombotic thrombocytopenia.  Did discuss with patient that if he has any severe changes to his symptoms in the interim to proceed to the emergency room.  Patient verbalized understanding to plan and is in agreement.   Orders Placed This Encounter  Procedures   CT Head Wo Contrast   CBC with Differential/Platelet   Protime-INR   APTT   Fibrinogen   D-Dimer, Quantitative   No orders of the defined types were placed in this encounter.   Inda Coke, [place] 03/12/2020  Time spent with the patient: 15 minutes, spent in obtaining information about his symptoms, reviewing his previous labs, evaluations, and treatments, counseling him about his condition (please see the discussed topics above), and developing a plan to further investigate it; he had a number of questions which I addressed.

## 2020-03-13 LAB — CBC WITH DIFFERENTIAL/PLATELET
Absolute Monocytes: 697 cells/uL (ref 200–950)
Basophils Absolute: 16 cells/uL (ref 0–200)
Basophils Relative: 0.2 %
Eosinophils Absolute: 122 cells/uL (ref 15–500)
Eosinophils Relative: 1.5 %
HCT: 43.6 % (ref 38.5–50.0)
Hemoglobin: 14.9 g/dL (ref 13.2–17.1)
Lymphs Abs: 1758 cells/uL (ref 850–3900)
MCH: 30.3 pg (ref 27.0–33.0)
MCHC: 34.2 g/dL (ref 32.0–36.0)
MCV: 88.8 fL (ref 80.0–100.0)
MPV: 10.3 fL (ref 7.5–12.5)
Monocytes Relative: 8.6 %
Neutro Abs: 5508 cells/uL (ref 1500–7800)
Neutrophils Relative %: 68 %
Platelets: 182 10*3/uL (ref 140–400)
RBC: 4.91 10*6/uL (ref 4.20–5.80)
RDW: 12.5 % (ref 11.0–15.0)
Total Lymphocyte: 21.7 %
WBC: 8.1 10*3/uL (ref 3.8–10.8)

## 2020-03-13 LAB — D-DIMER, QUANTITATIVE: D-Dimer, Quant: 0.59 mcg/mL FEU — ABNORMAL HIGH (ref <0.50)

## 2020-03-13 LAB — PROTIME-INR
INR: 1
Prothrombin Time: 10.2 s (ref 9.0–11.5)

## 2020-03-13 LAB — FIBRINOGEN: Fibrinogen: 344 mg/dL (ref 175–425)

## 2020-03-13 LAB — APTT: aPTT: 24 s (ref 23–32)

## 2020-03-13 MED ORDER — ACETAMINOPHEN-CODEINE #3 300-30 MG PO TABS: 1.0000 | ORAL_TABLET | Freq: Three times a day (TID) | ORAL | 0 refills | Status: DC | PRN

## 2020-03-13 NOTE — Telephone Encounter (Signed)
[facility] Database Verified LR: 01-13-2020 Qty: 30  Last office visit: 03-12-2020 acute with Samantha. 12-09-2019 Upcoming appointment: 06-14-2020

## 2020-03-26 ENCOUNTER — Telehealth: Payer: Self-pay | Admitting: Family Medicine

## 2020-03-26 NOTE — Telephone Encounter (Signed)
Agree. Please schedule with me.

## 2020-03-26 NOTE — Telephone Encounter (Signed)
Called pt to schedule appt - pt stated he will check his work schedule and return call to office.

## 2020-03-26 NOTE — Telephone Encounter (Signed)
Please call patient will need to have visit for evaluation

## 2020-03-26 NOTE — Telephone Encounter (Signed)
Pt called stating he is still experiencing side effects from his COVID vaccine. Pt asked if he could come in to get more lab work done. Please advise.

## 2020-04-08 ENCOUNTER — Other Ambulatory Visit: Payer: Self-pay | Admitting: Family Medicine

## 2020-04-08 DIAGNOSIS — G5721 Lesion of femoral nerve, right lower limb: Secondary | ICD-10-CM

## 2020-05-25 ENCOUNTER — Other Ambulatory Visit: Payer: Self-pay | Admitting: Family Medicine

## 2020-05-30 ENCOUNTER — Other Ambulatory Visit: Payer: Self-pay | Admitting: Family Medicine

## 2020-06-04 ENCOUNTER — Telehealth: Payer: Self-pay | Admitting: Family Medicine

## 2020-06-04 NOTE — Telephone Encounter (Signed)
Patient wife called and stated she went to pick up rosuvastatin (CRESTOR) 10 MG tablet from the pharmacy and it was on 30 pills which he usually gets 90.

## 2020-06-05 NOTE — Telephone Encounter (Signed)
Crestor was refilled correctly, it was Barry Schmidt that was incorrect. Patient usually receives 45 pills and scores them to make 90 day supply. Only received #30 this month. Informed [name] after speaking with [facility] that insurance and coupon card will only pay for #30 per month.

## 2020-06-14 ENCOUNTER — Ambulatory Visit: Payer: BC Managed Care – PPO | Admitting: Family Medicine

## 2020-06-27 ENCOUNTER — Telehealth: Payer: Self-pay | Admitting: Physician Assistant

## 2020-06-27 NOTE — Telephone Encounter (Signed)
He needs appt with [facility] for another injection. Thanks.

## 2020-06-27 NOTE — Telephone Encounter (Signed)
Tried to call patient no answer. Could not leave VM. Will try again later. Just needs an appt with Dr. [name] for inj.

## 2020-06-27 NOTE — Telephone Encounter (Signed)
See message.

## 2020-06-27 NOTE — Telephone Encounter (Signed)
Patient called requesting an appt for either today or tomorrow. Patient states to be in severe pain and need a cortisone injection in left shoulder. Patient is asking for an immediate call back. Patient phone number is

## 2020-06-28 ENCOUNTER — Telehealth: Payer: Self-pay | Admitting: Orthopaedic Surgery

## 2020-06-28 DIAGNOSIS — M7502 Adhesive capsulitis of left shoulder: Secondary | ICD-10-CM | POA: Diagnosis not present

## 2020-06-28 NOTE — Telephone Encounter (Signed)
I called pt to offer an appt with [place] [facility] today for shoulder injection of left adhesive capsulitis he states that because he could not get into our office today he made an appt with Dr. Darrelyn Hillock.

## 2020-06-28 NOTE — Telephone Encounter (Signed)
Patient called wanting to ask Dr. Roda Shutters if it is possible another doctor in our facility can administer a cortisone injection in his left shoulder today. Patient states his is in severe pain. Please call patient about this matter. Patient phone number is [phone].

## 2020-07-04 ENCOUNTER — Telehealth: Payer: Self-pay

## 2020-07-04 NOTE — Telephone Encounter (Signed)
Patient is aware to take 1/2 tablet of medication. I recently had an in-depth phone call with patient's wife who manages his medication.

## 2020-07-04 NOTE — Telephone Encounter (Signed)
Pt was upset that his medication bottle says 1 tablet

## 2020-07-04 NOTE — Telephone Encounter (Signed)
[facility]  [place]  "Barry Schmidt is saying that his prescription should say 1/2 tablet everyday, not 1 tablet everyday"

## 2020-07-14 IMAGING — DX DG CHEST 2V
2 series · 2 of 2 positions shown · non-contrast
Comparison: None.

CLINICAL DATA: Leg cramps.

EXAM:
CHEST - 2 VIEW

[chest pa]
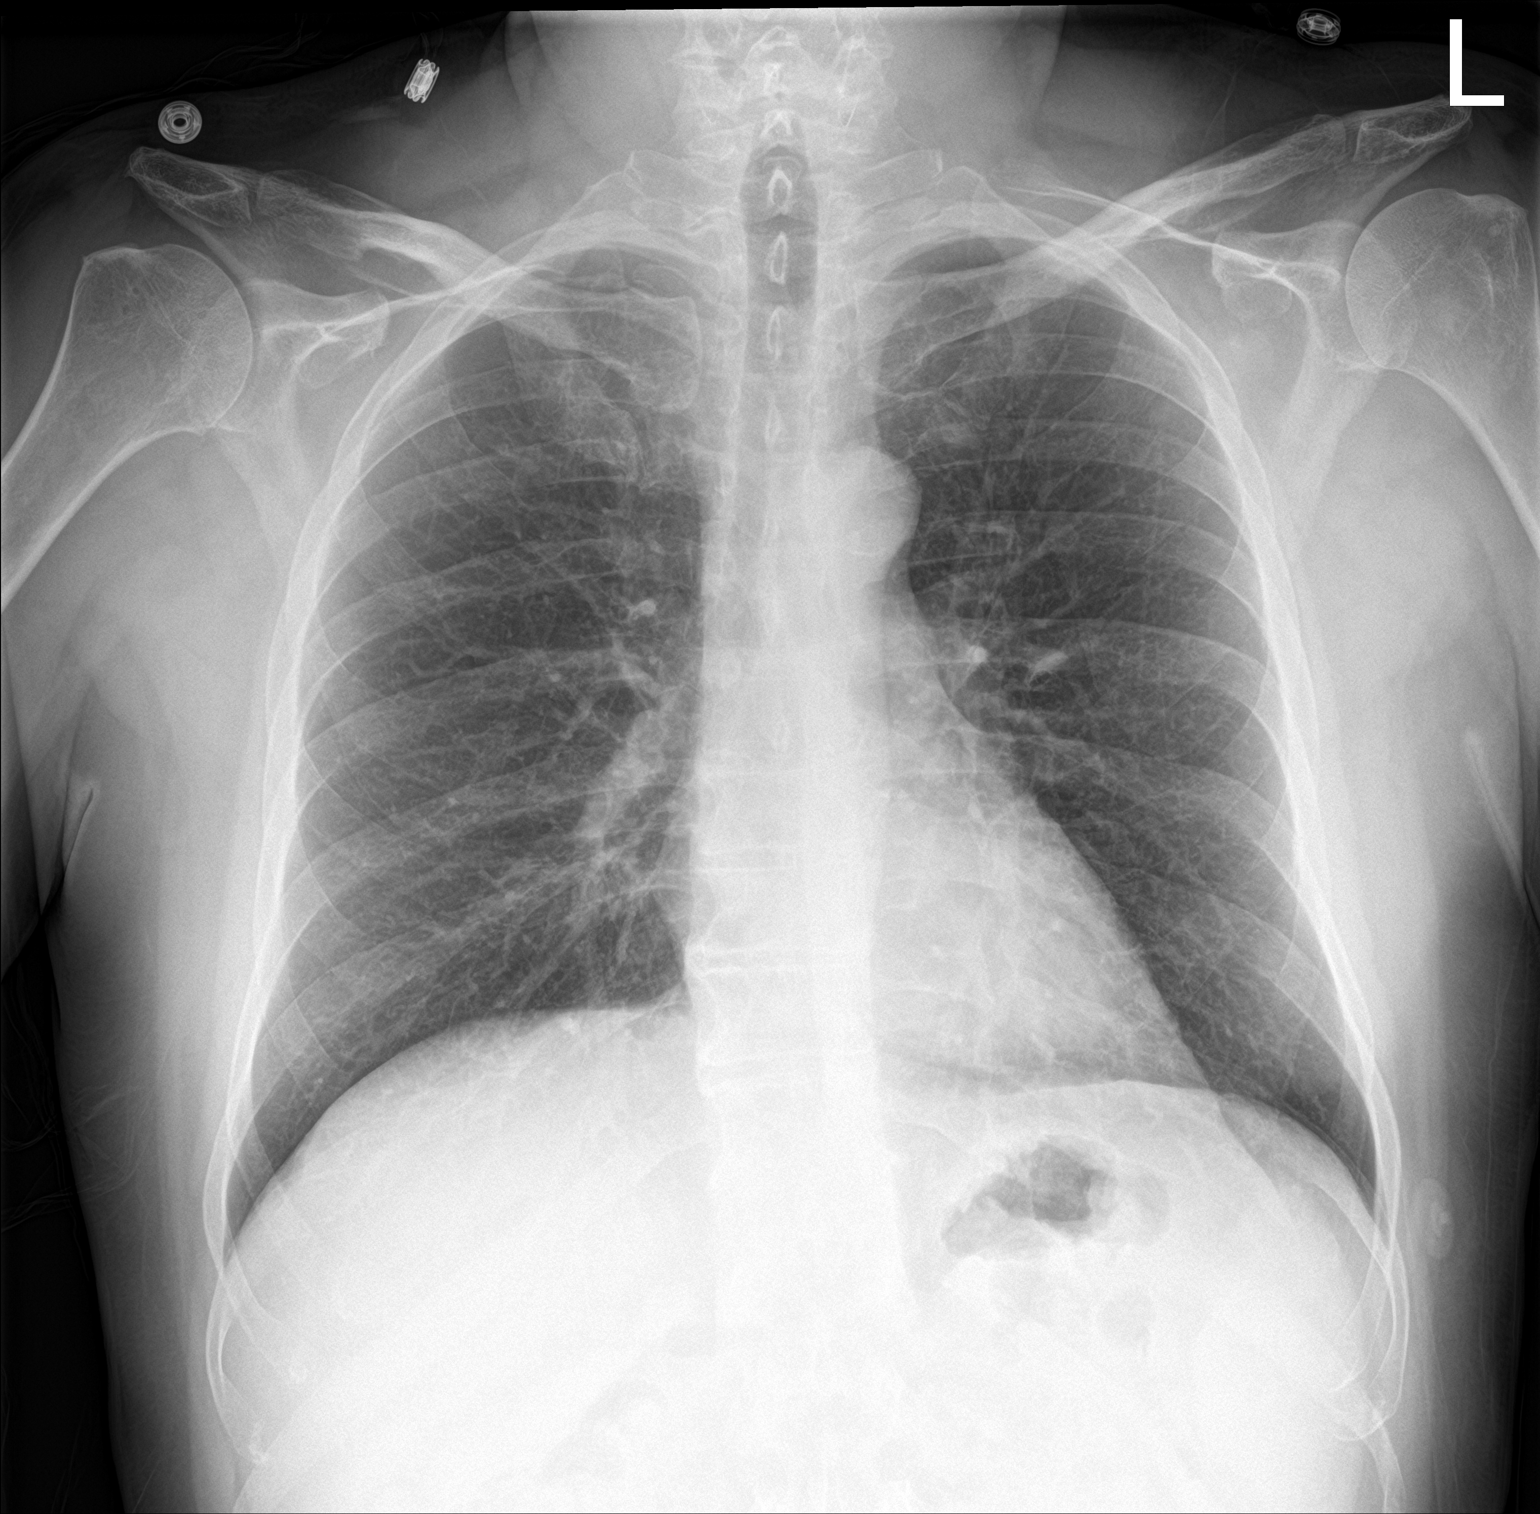

[chest lat]
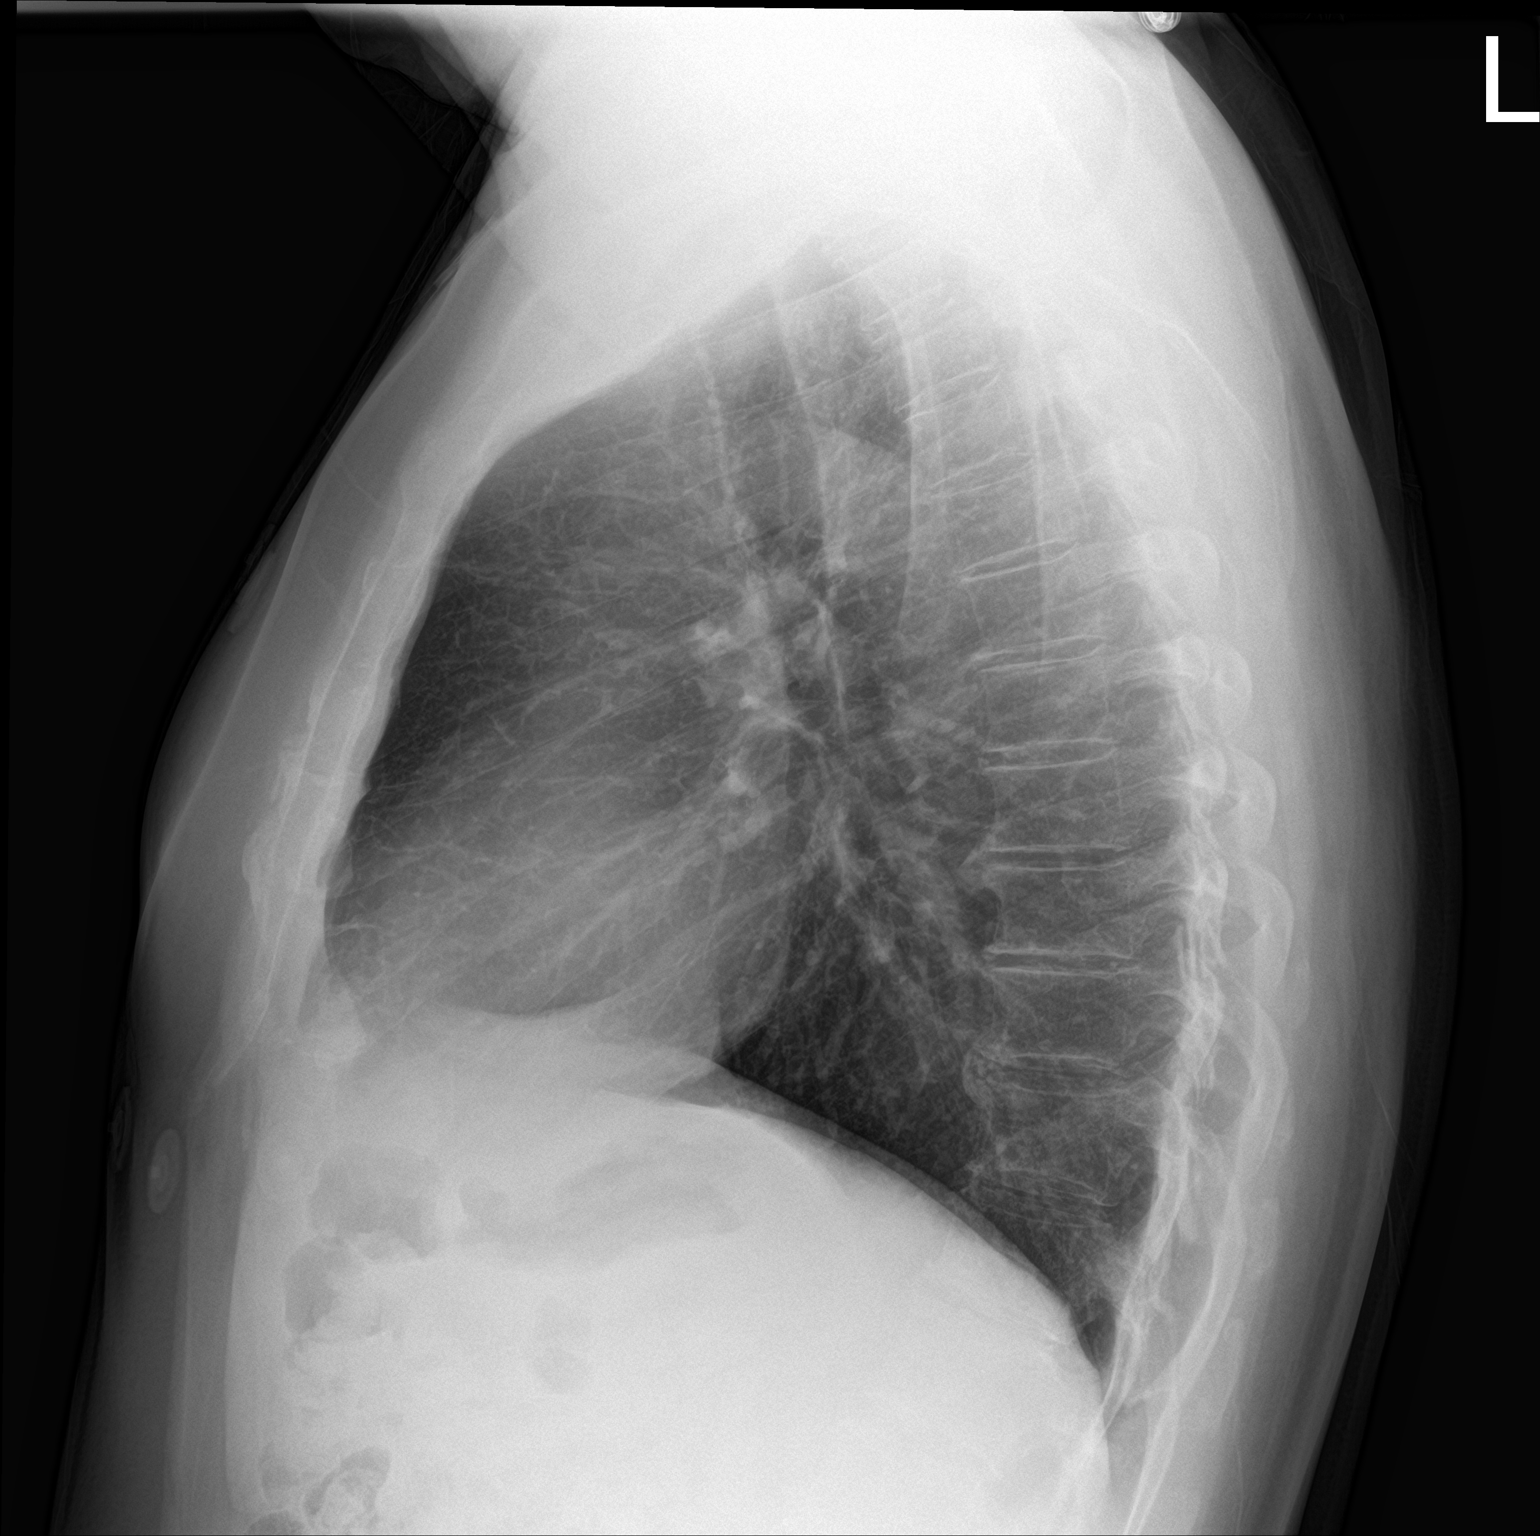

[2 of 2 positions shown; findings below may reference images not displayed]

FINDINGS: The heart, hila, mediastinum, lungs, and pleura are normal. No acute
abnormalities identified.
IMPRESSION: No active cardiopulmonary disease.

## 2020-07-16 ENCOUNTER — Other Ambulatory Visit: Payer: Self-pay

## 2020-07-16 ENCOUNTER — Telehealth: Payer: Self-pay

## 2020-07-16 MED ORDER — DAPAGLIFLOZIN PROPANEDIOL 10 MG PO TABS: 10.0000 mg | ORAL_TABLET | Freq: Every day | ORAL | 3 refills | Status: DC

## 2020-07-16 NOTE — Telephone Encounter (Signed)
pharmacy was calling to confirm dosage and states dapagliflozin propanediol (FARXIGA) 10 MG TABS tablet should be 1/2 a tablet instead of a full tablet. Please advise

## 2020-07-16 NOTE — Telephone Encounter (Signed)
[facility] 

## 2021-01-10 DIAGNOSIS — H34832 Tributary (branch) retinal vein occlusion, left eye, with macular edema: Secondary | ICD-10-CM | POA: Diagnosis not present

## 2021-02-12 DIAGNOSIS — H348322 Tributary (branch) retinal vein occlusion, left eye, stable: Secondary | ICD-10-CM | POA: Diagnosis not present

## 2021-02-12 DIAGNOSIS — H3562 Retinal hemorrhage, left eye: Secondary | ICD-10-CM | POA: Diagnosis not present

## 2021-02-12 DIAGNOSIS — H35033 Hypertensive retinopathy, bilateral: Secondary | ICD-10-CM | POA: Diagnosis not present

## 2021-02-12 DIAGNOSIS — H43811 Vitreous degeneration, right eye: Secondary | ICD-10-CM | POA: Diagnosis not present

## 2021-02-12 DIAGNOSIS — H35361 Drusen (degenerative) of macula, right eye: Secondary | ICD-10-CM | POA: Diagnosis not present

## 2021-02-22 ENCOUNTER — Telehealth: Payer: Self-pay

## 2021-02-22 NOTE — Telephone Encounter (Signed)
Spoke with patient regarding metformin and OV needed. States he will call when he is near his schedule to schedule an OV for diabetes follow up.

## 2021-02-22 NOTE — Telephone Encounter (Signed)
Pt hasn't been in to see me in over a year. Needs OV for diabetes. Please get scheduled when I am available.  No change to metformin now.

## 2021-02-22 NOTE — Telephone Encounter (Signed)
Please advise 

## 2021-02-22 NOTE — Telephone Encounter (Signed)
Patient received a letter stating that metformin has cancer causing agents and would like to know if you are going to switch him or keep him on the same dosage

## 2021-02-28 ENCOUNTER — Telehealth: Payer: Self-pay

## 2021-02-28 ENCOUNTER — Other Ambulatory Visit: Payer: Self-pay

## 2021-02-28 MED ORDER — METFORMIN HCL 1000 MG PO TABS: 1000.0000 mg | ORAL_TABLET | Freq: Two times a day (BID) | ORAL | 0 refills | Status: DC

## 2021-02-28 NOTE — Telephone Encounter (Signed)
[facility]

## 2021-02-28 NOTE — Telephone Encounter (Signed)
Patient is out of town and needs a loner dose of his metFORMIN (GLUCOPHAGE) 1000 MG tablet since he is out of town and wanted to see if at least 3 could be sent in to make it through his trip.   [facility] [address] [place], [place] [address] [phone].

## 2021-03-07 ENCOUNTER — Other Ambulatory Visit: Payer: Self-pay | Admitting: Family Medicine

## 2021-03-08 NOTE — Telephone Encounter (Signed)
Needs OV for further refills

## 2021-04-02 ENCOUNTER — Other Ambulatory Visit: Payer: Self-pay

## 2021-04-02 ENCOUNTER — Ambulatory Visit (INDEPENDENT_AMBULATORY_CARE_PROVIDER_SITE_OTHER): Payer: BC Managed Care – PPO | Admitting: Family Medicine

## 2021-04-02 ENCOUNTER — Encounter: Payer: Self-pay | Admitting: Family Medicine

## 2021-04-02 VITALS — BP 116/68 | HR 60 | Temp 98.0°F | Resp 15 | Ht 69.0 in | Wt 211.0 lb

## 2021-04-02 DIAGNOSIS — H43822 Vitreomacular adhesion, left eye: Secondary | ICD-10-CM | POA: Diagnosis not present

## 2021-04-02 DIAGNOSIS — G5721 Lesion of femoral nerve, right lower limb: Secondary | ICD-10-CM | POA: Diagnosis not present

## 2021-04-02 DIAGNOSIS — E1140 Type 2 diabetes mellitus with diabetic neuropathy, unspecified: Secondary | ICD-10-CM | POA: Diagnosis not present

## 2021-04-02 DIAGNOSIS — H43811 Vitreous degeneration, right eye: Secondary | ICD-10-CM | POA: Diagnosis not present

## 2021-04-02 DIAGNOSIS — R079 Chest pain, unspecified: Secondary | ICD-10-CM

## 2021-04-02 DIAGNOSIS — H35033 Hypertensive retinopathy, bilateral: Secondary | ICD-10-CM | POA: Diagnosis not present

## 2021-04-02 DIAGNOSIS — R9431 Abnormal electrocardiogram [ECG] [EKG]: Secondary | ICD-10-CM

## 2021-04-02 DIAGNOSIS — H348322 Tributary (branch) retinal vein occlusion, left eye, stable: Secondary | ICD-10-CM | POA: Diagnosis not present

## 2021-04-02 DIAGNOSIS — E782 Mixed hyperlipidemia: Secondary | ICD-10-CM | POA: Diagnosis not present

## 2021-04-02 DIAGNOSIS — J0100 Acute maxillary sinusitis, unspecified: Secondary | ICD-10-CM

## 2021-04-02 DIAGNOSIS — H35361 Drusen (degenerative) of macula, right eye: Secondary | ICD-10-CM | POA: Diagnosis not present

## 2021-04-02 DIAGNOSIS — Z87891 Personal history of nicotine dependence: Secondary | ICD-10-CM

## 2021-04-02 DIAGNOSIS — H3562 Retinal hemorrhage, left eye: Secondary | ICD-10-CM | POA: Diagnosis not present

## 2021-04-02 LAB — CBC WITH DIFFERENTIAL/PLATELET
Basophils Absolute: 0 10*3/uL (ref 0.0–0.1)
Basophils Relative: 0.4 % (ref 0.0–3.0)
Eosinophils Absolute: 0.1 10*3/uL (ref 0.0–0.7)
Eosinophils Relative: 1.6 % (ref 0.0–5.0)
HCT: 43.7 % (ref 39.0–52.0)
Hemoglobin: 14.9 g/dL (ref 13.0–17.0)
Lymphocytes Relative: 30.1 % (ref 12.0–46.0)
Lymphs Abs: 2.2 10*3/uL (ref 0.7–4.0)
MCHC: 34 g/dL (ref 30.0–36.0)
MCV: 85.3 fl (ref 78.0–100.0)
Monocytes Absolute: 0.7 10*3/uL (ref 0.1–1.0)
Monocytes Relative: 9.5 % (ref 3.0–12.0)
Neutro Abs: 4.4 10*3/uL (ref 1.4–7.7)
Neutrophils Relative %: 58.4 % (ref 43.0–77.0)
Platelets: 217 10*3/uL (ref 150.0–400.0)
RBC: 5.13 Mil/uL (ref 4.22–5.81)
RDW: 13.9 % (ref 11.5–15.5)
WBC: 7.5 10*3/uL (ref 4.0–10.5)

## 2021-04-02 LAB — LIPID PANEL
Cholesterol: 110 mg/dL (ref 0–200)
HDL: 51.1 mg/dL (ref >39.00)
LDL Cholesterol: 42 mg/dL (ref 0–99)
NonHDL: 58.95
Total CHOL/HDL Ratio: 2
Triglycerides: 87 mg/dL (ref 0.0–149.0)
VLDL: 17.4 mg/dL (ref 0.0–40.0)

## 2021-04-02 LAB — COMPREHENSIVE METABOLIC PANEL
ALT: 20 U/L (ref 0–53)
AST: 17 U/L (ref 0–37)
Albumin: 4.7 g/dL (ref 3.5–5.2)
Alkaline Phosphatase: 67 U/L (ref 39–117)
BUN: 19 mg/dL (ref 6–23)
CO2: 28 mEq/L (ref 19–32)
Calcium: 9.8 mg/dL (ref 8.4–10.5)
Chloride: 105 mEq/L (ref 96–112)
Creatinine, Ser: 0.95 mg/dL (ref 0.40–1.50)
GFR: 88.33 mL/min (ref >60.00)
Glucose, Bld: 85 mg/dL (ref 70–99)
Potassium: 4.1 mEq/L (ref 3.5–5.1)
Sodium: 142 mEq/L (ref 135–145)
Total Bilirubin: 0.4 mg/dL (ref 0.2–1.2)
Total Protein: 7.5 g/dL (ref 6.0–8.3)

## 2021-04-02 LAB — POCT GLYCOSYLATED HEMOGLOBIN (HGB A1C): Hemoglobin A1C: 7 % — AB (ref 4.0–5.6)

## 2021-04-02 LAB — MICROALBUMIN / CREATININE URINE RATIO
Creatinine,U: 91.1 mg/dL
Microalb Creat Ratio: 0.8 mg/g (ref 0.0–30.0)
Microalb, Ur: <0.7 mg/dL (ref 0.0–1.9)

## 2021-04-02 LAB — TSH: TSH: 6.7 u[IU]/mL — ABNORMAL HIGH (ref 0.35–4.50)

## 2021-04-02 MED ORDER — DOXYCYCLINE HYCLATE 100 MG PO TABS: 100.0000 mg | ORAL_TABLET | Freq: Two times a day (BID) | ORAL | 0 refills | Status: AC

## 2021-04-02 MED ORDER — METFORMIN HCL 1000 MG PO TABS: 1000.0000 mg | ORAL_TABLET | Freq: Two times a day (BID) | ORAL | 1 refills | Status: DC

## 2021-04-02 MED ORDER — DAPAGLIFLOZIN PROPANEDIOL 10 MG PO TABS: 10.0000 mg | ORAL_TABLET | Freq: Every day | ORAL | 1 refills | Status: DC

## 2021-04-02 NOTE — Progress Notes (Signed)
Subjective  CC:  Chief Complaint  Patient presents with   Diabetes   sinus congestion    Started 7 days ago, headaches    panic attacks    Daily when he gets to work    vaccine reaction    Requesting repeat D-Dimer from previous visit due to suspected clot   Health Maintenance    Requesting referral for colonoscopy     HPI: Barry Schmidt is a 60 y.o. male who presents to the office today for follow up of diabetes and problems listed above in the chief complaint.  60 year old male with diabetes, former smoker, hyperlipidemia presents after 80-monthhiatus.  He needs refills.  With multiple concerns. Complains of 1 week history of sinus infection.  Describes frontal and maxillary sinus pressure with thick drainage.  Denies sore throat, cough or fever.  Has had sinus infections in the past.  Requesting antibiotics.  He is vaccinated against COVID.  No known exposures.  Denies myalgias.  No GI symptoms. Diabetes follow up: His diabetic control is reported as  unknown .  He is on Farxiga 5 mg daily and metformin 1000 twice daily.  He tolerates his medications well.  He does not check his sugars.  It was last checked in March 2021.  He denies SOB or symptomatic hypoglycemia. He denies foot sores or paresthesias.  Had follow-up eye exam today.  Denies retinopathy. Reports daily "anxiety" attacks - feels stress upon walking into work and feels chest tightness.  He also reports that over the last 3 months he has been getting chest tightness associated with exertion.  [facility] and was carrying a cooler across [place].  He described chest tightness and pressure.  He needed to stop to take a break.  When he rests, the symptoms dissipate.  He denies associated shortness of breath or nausea, vomiting or sweating.  He also reports that he quit smoking about a year ago.  At that time, he was experiencing chest tightness at rest at night.  Once he quit smoking, though  symptoms improved.  He was a long-term smoker.  No history of lung disease.  No documented history of heart disease.  He denies palpitations or extremity edema.  He does keep active at work and at times will have to rest due to his symptoms of chest tightness.  He does take 81 mg aspirin daily.  He is on a statin for hyperlipidemia. Hyperlipidemia: Due for recheck.  Tolerates Lipitor 10 mg nightly. Review of systems is positive for shoulder pain. Health maintenance: Overdue for complete physical  Wt Readings from Last 3 Encounters:  04/02/21 211 lb (95.7 kg)  12/09/19 195 lb (88.5 kg)  11/25/19 209 lb 6.4 oz (95 kg)    BP Readings from Last 3 Encounters:  04/02/21 116/68  12/09/19 112/70  11/25/19 134/68    Assessment  1. Exertional chest pain   2. Controlled type 2 diabetes mellitus with neuropathy ([place]   3. Femoral neuropathy of right lower extremity   4. Acute non-recurrent maxillary sinusitis   5. Mixed hyperlipidemia   6. Abnormal EKG   7. Former smoker      Plan   Exertional chest pain: History is concerning for coronary artery disease and angina.  Education given.  Discussed with patient and his wife.  He has multiple risk factors for heart disease.  Given that he gets the symptoms consistently, chest tightness is improved with the last and he  does not always happen with anxiety related symptoms, I am concerned.  Urgent referral to cardiology for further cardiac testing.  His EKG is reviewed.  No comparisons available.  New incomplete right bundle branch block and nonspecific ST changes are present.  No acute ST elevation present.  No Q waves.  Continue aspirin 81 mg daily.  If symptoms present at rest or persist, emergency evaluation recommended.  Patient understands and agrees with care plan. Diabetes is currently marginally controlled.  A1c is elevated today.  He has gained weight since he quit smoking.  Will increase Farxiga to 10 mg daily and continue metformin.  Check lab  work today.  Recheck 3 months Possible acute sinusitis: Doxycycline antibiotic ordered. Former smoker Hyperlipidemia on statin: Recheck levels today.  We will push LDL to less than 70. Recommend weight loss. Will need follow-up for complete physical  Follow up: 3 months for follow-up diabetes. Orders Placed This Encounter  Procedures   CBC with Differential/Platelet   Comprehensive metabolic panel   Lipid panel   TSH   Microalbumin / creatinine urine ratio   Ambulatory referral to Cardiology   POCT HgB A1C   EKG 12-Lead   Meds ordered this encounter  Medications   dapagliflozin propanediol (FARXIGA) 10 MG TABS tablet    Sig: Take 1 tablet (10 mg total) by mouth daily.    Dispense:  90 tablet    Refill:  1   metFORMIN (GLUCOPHAGE) 1000 MG tablet    Sig: Take 1 tablet (1,000 mg total) by mouth 2 (two) times daily with a meal.    Dispense:  180 tablet    Refill:  1   doxycycline (VIBRA-TABS) 100 MG tablet    Sig: Take 1 tablet (100 mg total) by mouth 2 (two) times daily for 7 days.    Dispense:  14 tablet    Refill:  0      Immunization History  Administered Date(s) Administered   Influenza,inj,Quad PF,6+ Mos 06/24/2018, 06/03/2019   Janssen (J&J) SARS-COV-2 Vaccination 02/29/2020   Pneumococcal Polysaccharide-23 10/06/2010   Tdap 10/06/2012    Diabetes Related Lab Review: Lab Results  Component Value Date   HGBA1C 7.0 (A) 04/02/2021   HGBA1C 6.0 (A) 12/09/2019   HGBA1C 6.0 (A) 06/03/2019    Lab Results  Component Value Date   MICROALBUR <0.7 12/09/2019   Lab Results  Component Value Date   CREATININE 0.88 12/09/2019   BUN 17 12/09/2019   NA 139 12/09/2019   K 4.2 12/09/2019   CL 100 12/09/2019   CO2 30 12/09/2019   Lab Results  Component Value Date   CHOL 137 12/09/2019   CHOL 206 (H) 06/03/2019   CHOL 185 06/23/2018   Lab Results  Component Value Date   HDL 61.10 12/09/2019   HDL 54.90 06/03/2019   HDL 60.90 06/23/2018   Lab Results   Component Value Date   LDLCALC 62 12/09/2019   LDLCALC 123 (H) 06/03/2019   Lab Results  Component Value Date   TRIG 71.0 12/09/2019   TRIG 137.0 06/03/2019   TRIG (H) 06/23/2018    406.0 Triglyceride is over 400; calculations on Lipids are invalid.   Lab Results  Component Value Date   CHOLHDL 2 12/09/2019   CHOLHDL 4 06/03/2019   CHOLHDL 3 06/23/2018   Lab Results  Component Value Date   LDLDIRECT 90.0 06/23/2018   The 10-year ASCVD risk score Mikey Bussing DC Jr., et al., 2013) is: 11.5%   Values used to calculate  the score:     Age: 89 years     Sex: Male     Is Non-Hispanic African American: No     Diabetic: Yes     Tobacco smoker: Yes     Systolic Blood Pressure: 283 mmHg     Is BP treated: No     HDL Cholesterol: 61.1 mg/dL     Total Cholesterol: 137 mg/dL I have reviewed the PMH, Fam and Soc history. Patient Active Problem List   Diagnosis Date Noted   Mixed hyperlipidemia 04/02/2021   Former smoker 04/02/2021    Quit 11/2019     Adhesive capsulitis of left shoulder 01/19/2020   Controlled type 2 diabetes mellitus with neuropathy ([place]) 10/14/2018    Diagnosed in 2007; was morbidly obese at that time 260 pounds. He lost weight and did well.  Negative urine MAC 07/2018; not on ACE. Normotensive    On statin therapy due to risk of future cardiovascular event 10/14/2018   Femoral neuropathy of right lower extremity 10/14/2018    Social History: Patient  reports that he quit smoking about 16 months ago. His smoking use included cigarettes. He has never used smokeless tobacco. He reports current alcohol use. He reports that he does not use drugs.  Review of Systems: Ophthalmic: negative for eye pain, loss of vision or double vision Cardiovascular: negative for chest pain Respiratory: negative for SOB or persistent cough Gastrointestinal: negative for abdominal pain Genitourinary: negative for dysuria or gross hematuria MSK: negative for foot lesions Neurologic:  negative for weakness or gait disturbance  Objective  Vitals: BP 116/68   Pulse 60   Temp 98 F (36.7 C) (Temporal)   Resp 15   Ht _0  (1.753 m)   Wt 211 lb (95.7 kg)   SpO2 98%   BMI 31.16 kg/m  General: well appearing, no acute distress  Psych:  Alert and oriented, normal mood and affect HEENT:  Normocephalic, atraumatic, moist mucous membranes, supple neck  Cardiovascular:  Nl S1 and S2, RRR without murmur, gallop or rub. no edema Respiratory:  Good breath sounds bilaterally, CTAB with normal effort, no rales Gastrointestinal: normal BS, soft, nontender Skin:  Warm, no rashes Neurologic:   Mental status is normal. normal gait   EKG: Normal sinus rhythm, nonspecific T waves, no ST elevation.  Incomplete bundle branch block.  No comparison  Diabetic education: ongoing education regarding chronic disease management for diabetes was given today. We continue to reinforce the ABC's of diabetic management: A1c (<7 or 8 dependent upon patient), tight blood pressure control, and cholesterol management with goal LDL < 100 minimally. We discuss diet strategies, exercise recommendations, medication options and possible side effects. At each visit, we review recommended immunizations and preventive care recommendations for diabetics and stress that good diabetic control can prevent other problems. See below for this patient's data.   Commons side effects, risks, benefits, and alternatives for medications and treatment plan prescribed today were discussed, and the patient expressed understanding of the given instructions. Patient is instructed to call or message via MyChart if he/she has any questions or concerns regarding our treatment plan. No barriers to understanding were identified. We discussed Red Flag symptoms and signs in detail. Patient expressed understanding regarding what to do in case of urgent or emergency type symptoms.  Medication list was reconciled, printed and provided to the  patient in AVS. Patient instructions and summary information was reviewed with the patient as documented in the AVS. This note was prepared with  assistance of Systems analyst. Occasional wrong-word or sound-a-like substitutions may have occurred due to the inherent limitations of voice recognition software  This visit occurred during the SARS-CoV-2 public health emergency.  Safety protocols were in place, including screening questions prior to the visit, additional usage of staff PPE, and extensive cleaning of exam room while observing appropriate contact time as indicated for disinfecting solutions.

## 2021-04-02 NOTE — Patient Instructions (Addendum)
Please return in 3 months for diabetes follow up   I will release your lab results to you on your MyChart account with further instructions. Please reply with any questions.   I have   We will call you with your cardiology appointment. Please see below; I am concerned that your chest tightness could be angina. The heart testing will help [name] to determine this.  IF you have persistent or worsening chest pain, go to the ER.   If you have any questions or concerns, please don't hesitate to send me a message via MyChart or call the office at [phone]. Thank you for visiting with [name] today! It's our pleasure caring for you.   Angina  Angina is discomfort in the chest, neck, arm, jaw, or back. The discomfort is caused by a lack of blood in the middle layer of the heart wall (myocardium). There are four types of angina: Stable angina. This is triggered by vigorous activity or exercise. It goes away when you rest or take medicines that treat angina. This is diagnosed if you have had the symptom for more than 2 months. Unstable angina. This is a warning sign and can lead to a heart attack. This is a medical emergency. Symptoms come at rest and last a long time. Microvascular angina. This affects the small coronary arteries. Symptoms include chest pain, feeling tired, and being short of breath. The symptoms can last a long time or short time. Prinzmetal or variant angina. This is caused by a spasm of the arteries that go to your heart. What are the causes? This condition is usually caused by atherosclerosis. This is the buildup of fat and cholesterol (plaque) in your arteries. The plaque may narrow or block the artery. Other causes of angina include: Sudden spasms of the muscles of the arteries in the heart. Small artery disease (microvascular dysfunction). Problems with any of your heart valves. A tear in an artery in your heart (coronary artery dissection). Weakness of the heart muscle  (cardiomyopathy). What increases the risk? You are more likely to develop this condition if you have: High cholesterol. High blood pressure. Diabetes. A family history of heart disease. A sedentary lifestyle, or a lifestyle in which you do not exercise enough. Depression. Had radiation treatment to the left side of your chest. Other risk factors include: Using tobacco. Being obese. Eating a diet high in saturated fats. Being exposed to high stress or triggers of stress. Using drugs, such as cocaine. Women have a greater risk for angina if they: Are older than age 68. Have gone through menopause. What are the signs or symptoms? Common symptoms of this condition in both men and women may include: Chest pain, which may: Feel like a crushing or squeezing in the chest, or a tightness, pressure, fullness, or heaviness in the chest. Last for more than a few minutes, or stop and come back over a few minutes. Pain in the neck, arm, jaw, or back. Unexplained heartburn or indigestion. Shortness of breath. Nausea. Sudden cold sweats. Women and people with diabetes may have unusual (atypical) symptoms, such as: Fatigue. Unexplained feelings of nervousness or anxiety. Unexplained weakness. Dizziness or fainting. How is this diagnosed? This condition may be diagnosed based on: Your symptoms and medical history. Electrocardiogram (ECG) to measure the electrical activity in your heart. Blood tests. Stress test to look for signs of blockage when your heart is stressed. CT angiogram to examine your heart and the blood flow to it. Coronary angiogram to check  for arterial blockage. Echocardiogram (ultrasound) to assess the strength of your heartbeat. How is this treated? Angina may be treated with: Medicines to: Prevent blood clots and heart attack. Relax blood vessels and improve blood flow to the heart. Reduce blood pressure, improve heart pumping, and relax blood vessels spasms. Reduce  cholesterol and help treat atherosclerosis. A procedure to widen a narrowed or blocked coronary artery (angioplasty). A mesh tube (stent) may be placed in a coronary artery to keep it open. Surgery to allow blood to go around a blocked artery (coronary artery bypass surgery). Follow these instructions at home: Medicines Take over-the-counter and prescription medicines only as told by your health care provider. Do not take the following medicines unless your health care provider approves: NSAIDs, such as ibuprofen or naproxen. Vitamin supplements that contain vitamin A, vitamin E, or both. Hormone replacement therapy that contains estrogen with or without progestin. Eating and drinking  Eat a heart-healthy diet. This includes plenty of fresh fruits and vegetables, whole grains, low-fat (lean) protein, and low-fat dairy products. Follow instructions from your health care provider about eating or drinking restrictions.  Activity Follow an exercise program approved by your health care provider. Consider joining a cardiac rehabilitation program. Take a break when you feel fatigued. Plan rest periods in your daily activities. Lifestyle  Do not use any products that contain nicotine or tobacco. These products include cigarettes, chewing tobacco, and vaping devices, such as e-cigarettes. If you need help quitting, ask your health care provider. If your health care provider says you can drink alcohol: Limit how much you have to: 0-1 drink a day for women who are not pregnant. 0-2 drinks a day for men. Be aware of how much alcohol is in your drink. In the [place] one drink equals one 12 oz bottle of beer (355 mL), one 5 oz glass of wine (148 mL), or one 1 oz glass of hard liquor (44 mL).  General instructions Maintain a healthy weight. Learn to manage stress. Keep your vaccinations up to date. Get the flu (influenza) vaccine every year. Talk to your health care provider if you feel depressed.  Take a depression screening test to see if you are at risk for depression. Work with your health care provider to manage other health conditions, such as hypertension or diabetes. Keep all follow-up visits. This is important. Get help right away if: You have pain in your chest, neck, arm, jaw, or back, and the pain: Lasts more than a few minutes. Is recurring. Is not relieved by taking medicines under the tongue (sublingual nitroglycerin). Increases in intensity or frequency. You have a lot of sweating without cause. You have unexplained: Heartburn or indigestion. Shortness of breath or difficulty breathing. Nausea or vomiting. Fatigue. Feelings of nervousness or anxiety. Weakness. You have sudden light-headedness or dizziness. You faint. These symptoms may represent a serious problem that is an emergency. Do not wait to see if the symptoms will go away. Get medical help right away. Call your local emergency services (911 in the [place] Do not drive yourself to the hospital. Summary Angina is discomfort in the chest, neck, arm, jaw, or back that is caused by a lack of blood in the arteries of the heart wall. There are many symptoms of angina. They include chest pain, unexplained heartburn or indigestion, sudden cold sweats, and fatigue. Angina may be treated with lifestyle changes, medicines, or surgery. Symptoms of angina may represent an emergency. Get medical help right away. Call your local emergency services (  911 in the [place] Do not drive yourself to the hospital. This information is not intended to replace advice given to you by your health care provider. Make sure you discuss any questions you have with your healthcare provider. Document Revised: 03/16/2020 Document Reviewed: 03/16/2020 Elsevier Patient Education  2022 [employer].

## 2021-04-03 NOTE — Progress Notes (Signed)
Please call patient: I have reviewed his/her lab results. His blood work looks great. Cholesterol levels are great.  His thyroid screen is off a little so I will order more tests (please add on T3 and Free T4) and get back to him. I see he will be evaluated by cardiology tomorrow.  Then f/u with me as scheduled in September, unless his cardiac eval is negative; then I would help him with his chest tightness symptoms sooner. Thanks.

## 2021-04-04 ENCOUNTER — Telehealth: Payer: Self-pay | Admitting: Cardiovascular Disease

## 2021-04-04 ENCOUNTER — Encounter: Payer: Self-pay | Admitting: Cardiovascular Disease

## 2021-04-04 ENCOUNTER — Ambulatory Visit (INDEPENDENT_AMBULATORY_CARE_PROVIDER_SITE_OTHER): Payer: BC Managed Care – PPO | Admitting: Cardiovascular Disease

## 2021-04-04 ENCOUNTER — Other Ambulatory Visit: Payer: Self-pay

## 2021-04-04 VITALS — BP 130/72 | HR 64 | Ht 68.0 in | Wt 214.0 lb

## 2021-04-04 DIAGNOSIS — H0263 Xanthelasma of right eye, unspecified eyelid: Secondary | ICD-10-CM

## 2021-04-04 DIAGNOSIS — E119 Type 2 diabetes mellitus without complications: Secondary | ICD-10-CM | POA: Diagnosis not present

## 2021-04-04 DIAGNOSIS — R0600 Dyspnea, unspecified: Secondary | ICD-10-CM | POA: Diagnosis not present

## 2021-04-04 DIAGNOSIS — Z87891 Personal history of nicotine dependence: Secondary | ICD-10-CM

## 2021-04-04 DIAGNOSIS — E782 Mixed hyperlipidemia: Secondary | ICD-10-CM | POA: Diagnosis not present

## 2021-04-04 DIAGNOSIS — R0609 Other forms of dyspnea: Secondary | ICD-10-CM

## 2021-04-04 DIAGNOSIS — I208 Other forms of angina pectoris: Secondary | ICD-10-CM | POA: Diagnosis not present

## 2021-04-04 MED ORDER — METOPROLOL SUCCINATE ER 25 MG PO TB24: 25.0000 mg | ORAL_TABLET | Freq: Every day | ORAL | 3 refills | Status: DC

## 2021-04-04 MED ORDER — NITROGLYCERIN 0.4 MG SL SUBL: 0.4000 mg | SUBLINGUAL_TABLET | SUBLINGUAL | 6 refills | Status: DC | PRN

## 2021-04-04 NOTE — Progress Notes (Signed)
Cardiology Office Note    Date:  04/04/2021   ID:  Barry Schmidt, DOB [birth year], MRN [id number]  PCP:  Barry Arnt, MD  Cardiologist:  Barry Majestic, MD   New cardiology consultation referred through the courtesy of Dr. Billey Schmidt for evaluation of chest tightness   History of Present Illness:  Barry Schmidt is a 60 y.o. male who is originally from [facility].  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  He had recently developed chest tightness particularly while lifting objects and is referred by Dr. Jonni Schmidt for cardiology consultation.  Mr. Barry Schmidt has remote tobacco history and had smoked off and on for 12 years but quit in February 2019.  He has a history of diabetes mellitus for at least 10 years.  He states that he had had episodes of vertigo and staggering shortly following being vaccinated for COVID.  He underwent MRI of his head.  He states that 1 year ago he started to notice more shortness of breath when wearing a mask.  Recently, he has experienced progressive shortness of breath with activity and chest tightness when he is walking and carrying things.  He recently attended [place] [employer] and developed significant chest tightness while walking on his infield carrying a cooler.  He describes a tightness as a pressure at the weight sitting on his chest.  His symptoms dissipate with rest.  He was evaluated by Dr. Jonni Schmidt on April 02, 2021 and due to concern for angina pectoris cardiology consultation was requested.   Past Medical History:  Diagnosis Date   Diabetes mellitus without complication ([place])    On statin therapy due to risk of future cardiovascular event 10/14/2018   Uncontrolled diabetes mellitus without complication, with long-term current use of insulin 06/23/2018   Diagnosed in 2007; was morbidly obese at that time 260 pounds. He lost weight and did well.     He has recently started to see a retina specialist  for his left eye.  He has a history of hyperlipidemia and was recently started on rosuvastatin.  He has been on Farxiga and metformin for his diabetes mellitus.  He also has a history for neuropathy.  No past surgical history on file.  Current Medications: Outpatient Medications Prior to Visit  Medication Sig Dispense Refill   ACCU-CHEK AVIVA PLUS test strip USE AS INSTRUCTED TO TEST BLOOD GLUCOSE 100 each 12   aspirin EC 81 MG tablet Take 81 mg by mouth daily.     Blood Glucose Monitoring Suppl (ONE TOUCH ULTRA 2) w/Device KIT 1 kit by Does not apply route 2 (two) times daily. 1 each 0   dapagliflozin propanediol (FARXIGA) 10 MG TABS tablet Take 1 tablet (10 mg total) by mouth daily. 90 tablet 1   doxycycline (VIBRA-TABS) 100 MG tablet Take 1 tablet (100 mg total) by mouth 2 (two) times daily for 7 days. 14 tablet 0   Lancets (ONETOUCH ULTRASOFT) lancets Use as instructed 100 each 12   metFORMIN (GLUCOPHAGE) 1000 MG tablet Take 1 tablet (1,000 mg total) by mouth 2 (two) times daily with a meal. 180 tablet 1   rosuvastatin (CRESTOR) 10 MG tablet TAKE 1 TABLET BY MOUTH ONCE DAILY 90 tablet 3   acetaminophen-codeine (TYLENOL #3) 300-30 MG tablet Take 1 tablet by mouth every 8 (eight) hours as needed for moderate pain. 30 tablet 0   amitriptyline (ELAVIL) 25 MG tablet TAKE 1 TABLET BY MOUTH EVERYDAY AT BEDTIME 90  tablet 1   diclofenac (VOLTAREN) 75 MG EC tablet Take 1 tablet (75 mg total) by mouth 2 (two) times daily. 60 tablet 1   ondansetron (ZOFRAN) 4 MG tablet Take 1 tablet (4 mg total) by mouth every 8 (eight) hours as needed for nausea or vomiting. 20 tablet 0   No facility-administered medications prior to visit.     Allergies:   Penicillins   Social History   Socioeconomic History   Marital status: Married    Spouse name: Not on file   Number of children: Not on file   Years of education: Not on file   Highest education level: Not on file  Occupational History   Occupation:  Freight forwarder, retail    Employer: [employer]  Tobacco Use   Smoking status: Former    Pack years: 0.00    Types: Cigarettes    Quit date: 11/07/2019    Years since quitting: 1.4   Smokeless tobacco: Never  Vaping Use   Vaping Use: Former  Substance and Sexual Activity   Alcohol use: Yes    Comment: Occastional    Drug use: Never   Sexual activity: Yes    Partners: Female  Other Topics Concern   Not on file  Social History Narrative   Right handed    Caffeine 4 daily    Lives at home with wife ( Barry Schmidt) and child.   Social Determinants of Health   Financial Resource Strain: Not on file  Food Insecurity: Not on file  Transportation Needs: Not on file  Physical Activity: Not on file  Stress: Not on file  Social Connections: Not on file    Socially he was born in [place].  He is in his second marriage of 64 years..  His first marriage lasted 4 years.  He has 2 children ages 48 and 25 and 2 grandchildren ages 11 and 62.  He started college but never finished.  He does not routinely exercise but is states while at work he often walks up to 18,000 steps per day.   Family History:  The patient's family history includes Diabetes in his brother, brother, and sister; Diabetes type I in his father; Healthy in his son and son; Heart attack in his father; Heart disease in his mother; Hypertension in his mother.   His mother is alive at age 65.  Father died at 25 and had diabetes and heart problems and was status post pacemaker insertion.  1 brother died at 98 and had suffered a myocardial infarction in his 1s.  He has 3 living brothers in their 40s and 2 sisters ages 79 and 55 both with diabetes.  ROS General: Negative; No fevers, chills, or night sweats;  HEENT: Recent left eye problem seeing a retina specialist.  No changes in hearing, sinus congestion, difficulty swallowing Pulmonary: Negative; No cough, wheezing, shortness of breath, hemoptysis Cardiovascular: See HPI GI:  Negative; No nausea, vomiting, diarrhea, or abdominal pain GU: Negative; No dysuria, hematuria, or difficulty voiding Musculoskeletal: Negative; no myalgias, joint pain, or weakness Hematologic/Oncology: Negative; no easy bruising, bleeding Endocrine: Negative; no heat/cold intolerance; no diabetes Neuro: Negative; no changes in balance, headaches Skin: Negative; No rashes or skin lesions Psychiatric: Negative; No behavioral problems, depression Sleep: Negative; No snoring, daytime sleepiness, hypersomnolence, bruxism, restless legs, hypnogognic hallucinations, no cataplexy Other comprehensive 14 point system review is negative.   PHYSICAL EXAM:   VS:  BP 130/72   Pulse 64   Ht _0  (1.727 m)  Wt 214 lb (97.1 kg)   SpO2 95%   BMI 32.54 kg/m     Repeat blood pressure by me was 126/70  Wt Readings from Last 3 Encounters:  04/04/21 214 lb (97.1 kg)  04/02/21 211 lb (95.7 kg)  12/09/19 195 lb (88.5 kg)    General: Alert, oriented, no distress.  Skin: normal turgor, no rashes, warm and dry HEENT: Normocephalic, atraumatic. Pupils equal round and reactive to light; sclera anicteric; extraocular muscles intact; xanthelasma over the right eye Nose without nasal septal hypertrophy Mouth/Parynx benign; Mallinpatti scale 3 Neck: No JVD, no carotid bruits; normal carotid upstroke Lungs: clear to ausculatation and percussion; no wheezing or rales Chest wall: without tenderness to palpitation Heart: PMI not displaced, RRR, s1 s2 normal, 1/6 systolic murmur, no diastolic murmur, no rubs, gallops, thrills, or heaves Abdomen: soft, nontender; no hepatosplenomehaly, BS+; abdominal aorta nontender and not dilated by palpation. Back: no CVA tenderness Pulses 2+ Musculoskeletal: full range of motion, normal strength, no joint deformities Extremities: no clubbing cyanosis or edema, Homan's sign negative  Neurologic: grossly nonfocal; Cranial nerves grossly wnl Psychologic: Normal mood and  affect   Studies/Labs Reviewed:   EKG:  EKG is ordered today.  ECG (independently read by me): NSR at 62, IRBBB, PRWP   Recent Labs: BMP Latest Ref Rng & Units 04/02/2021 12/09/2019 06/03/2019  Glucose 70 - 99 mg/dL 85 106(H) 101(H)  BUN 6 - 23 mg/dL _0 Creatinine 0.40 - 1.50 mg/dL 0.95 0.88 0.79  BUN/Creat Ratio 9 - 20 - - -  Sodium 135 - 145 mEq/L 142 139 140  Potassium 3.5 - 5.1 mEq/L 4.1 4.2 4.2  Chloride 96 - 112 mEq/L 105 100 104  CO2 19 - 32 mEq/L _1 Calcium 8.4 - 10.5 mg/dL 9.8 9.3 9.4     Hepatic Function Latest Ref Rng & Units 04/02/2021 12/09/2019 06/03/2019  Total Protein 6.0 - 8.3 g/dL 7.5 6.7 6.9  Albumin 3.5 - 5.2 g/dL 4.7 4.4 4.6  AST 0 - 37 U/L _2 ALT 0 - 53 U/L _3 Alk Phosphatase 39 - 117 U/L 67 84 76  Total Bilirubin 0.2 - 1.2 mg/dL 0.4 0.4 0.3    CBC Latest Ref Rng & Units 04/02/2021 03/12/2020 12/09/2019  WBC 4.0 - 10.5 K/uL 7.5 8.1 6.1  Hemoglobin 13.0 - 17.0 g/dL 14.9 14.9 14.5  Hematocrit 39.0 - 52.0 % 43.7 43.6 42.1  Platelets 150.0 - 400.0 K/uL 217.0 182 182.0   Lab Results  Component Value Date   MCV 85.3 04/02/2021   MCV 88.8 03/12/2020   MCV 88.0 12/09/2019   Lab Results  Component Value Date   TSH 6.70 (H) 04/02/2021   Lab Results  Component Value Date   HGBA1C 7.0 (A) 04/02/2021     BNP No results found for: BNP  ProBNP No results found for: PROBNP   Lipid Panel     Component Value Date/Time   CHOL 110 04/02/2021 1219   TRIG 87.0 04/02/2021 1219   HDL 51.10 04/02/2021 1219   CHOLHDL 2 04/02/2021 1219   VLDL 17.4 04/02/2021 1219   LDLCALC 42 04/02/2021 1219   LDLDIRECT 90.0 06/23/2018 1113     RADIOLOGY: No results found.   Additional studies/ records that were reviewed today include:  I reviewed the recent evaluation by Dr. Billey Schmidt.  I also reviewed his [facility] emergency room evaluation in 2019 when he presented with right leg pain and  was felt to have sciatica.   ASSESSMENT:    1.  Exertional angina (HCC)   2. Exertional dyspnea   3. Mixed hyperlipidemia   4. Type 2 diabetes mellitus without complication, without long-term current use of insulin ([place])   5. Xanthelasma of right eyelid   6. History of tobacco use: Quit November 25, 2019    PLAN:  Mr. Malakye Nolden is a 60 year old gentleman who has a prior tobacco history and fortunately quit on his birthday [birth year] 1-1/2 years ago.  He has a history of diabetes mellitus and on exam he has evidence for xanthelasma above his right eye.  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  Recently he has noted definite exertional chest tightness when he is carrying objects.  His symptoms became fairly intense when he was at [facility] and was carrying a cooler across the infield where he had to stop on several times to catch his breath.  He denies any resting chest wall discomfort.  Cardiac risk factors are notable for prior tobacco use, diabetes mellitus, hyperlipidemia, and family history for CAD.  I had a very lengthy discussion today with both he and his wife.  He is certain that he first experiences chest tightness and then experiences shortness of breath.  I am concerned that his symptoms may be ischemia mediated.  I am scheduling him to undergo 2D echo Doppler study.  We discussed multiple scenarios of evaluation including noninvasive evaluation with coronary CTA imaging versus nuclear stress testing versus definitive cardiac catheterization.  He tells me he will be going on vacation the week of July 9 through July 16.  With his recurrent symptomatology, after much discussion I have recommended definitive cardiac catheterization.  This will provide him with a definitive answer regarding his coronary anatomy and can be done more expeditiously compared to coronary CTA imaging.  I am giving him a prescription for metoprolol succinate 25 mg which will be helpful to reduce potential  exertional symptomatology.  He also giving him a prescription for sublingual nitroglycerin.  I will schedule him to undergo his echo Doppler study prior to his vacation.  I will plan to perform definitive cardiac catheterization upon his return from vacation and this has been tentatively scheduled for April 24, 2021.  Prior to his return from vacation he will undergo complete set of fasting laboratory.I have reviewed the risks, indications, and alternatives to cardiac catheterization, possible angioplasty, and stenting with the patient. Risks include but are not limited to bleeding, infection, vascular injury, stroke, myocardial infection, arrhythmia, kidney injury, radiation-related injury in the case of prolonged fluoroscopy use, emergency cardiac surgery, and death. The patient understands the risks of serious complication is 1-2 in 9147 with diagnostic cardiac cath and 1-2% or less with angioplasty/stenting.  I answered all the patient's questions and I will contact him regarding his echo Doppler study and laboratory and see him on July 20 for the cardiac catheterization evaluation.   Medication Adjustments/Labs and Tests Ordered: Current medicines are reviewed at length with the patient today.  Concerns regarding medicines are outlined above.  Medication changes, Labs and Tests ordered today are listed in the Patient Instructions below. Patient Instructions  Medication Instructions:  BEGIN metoprolol succinate (Toprol XL) 28m daily. Per Dr. KClaiborne Billingsif your heart rate gets too low you may take 12.540m(1/2 tablet) instead.   *If you need a refill on your cardiac medications before your next appointment,  please call your pharmacy*   Lab Work: CBC, CMET, TSH to be drawn Monday July 18.  If you have labs (blood work) drawn today and your tests are completely normal, you will receive your results only by: [place] (if you have MyChart) OR A paper copy in the mail If you have any lab test that  is abnormal or we need to change your treatment, we will call you to review the results.   Testing/Procedures: Your physician has requested that you have a cardiac catheterization. Cardiac catheterization is used to diagnose and/or treat various heart conditions. Doctors may recommend this procedure for a number of different reasons. The most common reason is to evaluate chest pain. Chest pain can be a symptom of coronary artery disease (CAD), and cardiac catheterization can show whether plaque is narrowing or blocking your heart's arteries. This procedure is also used to evaluate the valves, as well as measure the blood flow and oxygen levels in different parts of your heart. For further information please visit [email or web address]. Please follow instruction sheet, as given.    Follow-Up: At [facility], you and your health needs are our priority.  As part of our continuing mission to provide you with exceptional heart care, we have created designated Provider Care Teams.  These Care Teams include your primary Cardiologist (physician) and Advanced Practice Providers (APPs -  Physician Assistants and Nurse Practitioners) who all work together to provide you with the care you need, when you need it.  We recommend signing up for the patient portal called "MyChart".  Sign up information is provided on this After Visit Summary.  MyChart is used to connect with patients for Virtual Visits (Telemedicine).  Patients are able to view lab/test results, encounter notes, upcoming appointments, etc.  Non-urgent messages can be sent to your provider as well.   To learn more about what you can do with MyChart, go to [email or web address].    Your next appointment:   Post cath  The format for your next appointment:   In Person  Provider:   [name], MD   Other Instructions    [facility]  [place] [address] Dept: [phone] Loc: [phone]  Amond Speranza  04/04/2021  You are scheduled for a Cardiac Catheterization on Wednesday, July 20 with Dr. Shelva Schmidt.  1. Please arrive at the [facility] (Main Entrance A) at [facility]: [address] [place], [place] [address] at 8:00 AM (This time is two hours before your procedure to ensure your preparation). Free valet parking service is available.   Special note: Every effort is made to have your procedure done on time. Please understand that emergencies sometimes delay scheduled procedures.  2. Diet: Do not eat solid foods after midnight.  The patient may have clear liquids until 5am upon the day of the procedure.  3. Labs: You will need to have blood drawn on July 18  4. Medication instructions in preparation for your procedure:  Do not take Diabetes Med Glucophage (Metformin) on the day of the procedure and HOLD 48 HOURS AFTER THE PROCEDURE.  On the morning of your procedure, take your Aspirin and any morning medicines NOT listed above.  You may use sips of water.  5. Plan for one night stay--bring personal belongings. 6. Bring a current list of your medications and current insurance cards. 7. You MUST have a responsible person to drive you home. 8. Someone  MUST be with you the first 24 hours after you arrive home or your discharge will be delayed. 9. Please wear clothes that are easy to get on and off and wear slip-on shoes.  Thank you for allowing [name] to care for you!   -- [place] [facility]    Signed, Barry Majestic, MD  04/04/2021 [facility] [address] [place], [place], [place]  [address] Phone: [phone]

## 2021-04-04 NOTE — Patient Instructions (Signed)
Medication Instructions:  BEGIN metoprolol succinate (Toprol XL) 25mg  daily. Per Dr. if your heart rate gets too low you may take 12.5mg  (1/2 tablet) instead.   *If you need a refill on your cardiac medications before your next appointment, please call your pharmacy*   Lab Work: CBC, CMET, TSH to be drawn Monday July 18.  If you have labs (blood work) drawn today and your tests are completely normal, you will receive your results only by: MyChart Message (if you have MyChart) OR A paper copy in the mail If you have any lab test that is abnormal or we need to change your treatment, we will call you to review the results.   Testing/Procedures: Your physician has requested that you have a cardiac catheterization. Cardiac catheterization is used to diagnose and/or treat various heart conditions. Doctors may recommend this procedure for a number of different reasons. The most common reason is to evaluate chest pain. Chest pain can be a symptom of coronary artery disease (CAD), and cardiac catheterization can show whether plaque is narrowing or blocking your heart's arteries. This procedure is also used to evaluate the valves, as well as measure the blood flow and oxygen levels in different parts of your heart. For further information please visit July 20. Please follow instruction sheet, as given.    Follow-Up: At [facility], you and your health needs are our priority.  As part of our continuing mission to provide you with exceptional heart care, we have created designated Provider Care Teams.  These Care Teams include your primary Cardiologist (physician) and Advanced Practice Providers (APPs -  Physician Assistants and Nurse Practitioners) who all work together to provide you with the care you need, when you need it.  We recommend signing up for the patient portal called "MyChart".  Sign up information is provided on this After Visit Summary.  MyChart is used to connect with  patients for Virtual Visits (Telemedicine).  Patients are able to view lab/test results, encounter notes, upcoming appointments, etc.  Non-urgent messages can be sent to your provider as well.   To learn more about what you can do with MyChart, go to [facility].    Your next appointment:   Post cath  The format for your next appointment:   In Person  Provider:   [email or web address], MD   Other Instructions    [facility] CARDIOVASCULAR DIVISION [facility] [address] [place] [address] [place] Dept: [phone] Loc: [phone]  Jethro Radke  04/04/2021  You are scheduled for a Cardiac Catheterization on Wednesday, July 20 with Dr. July 22.  1. Please arrive at the [facility] (Main Entrance A) at [facility]: [address] [place], [place] at 8:00 AM (This time is two hours before your procedure to ensure your preparation). Free valet parking service is available.   Special note: Every effort is made to have your procedure done on time. Please understand that emergencies sometimes delay scheduled procedures.  2. Diet: Do not eat solid foods after midnight.  The patient may have clear liquids until 5am upon the day of the procedure.  3. Labs: You will need to have blood drawn on July 18  4. Medication instructions in preparation for your procedure:  Do not take Diabetes Med Glucophage (Metformin) on the day of the procedure and HOLD 48 HOURS AFTER THE PROCEDURE.  On the morning of your procedure, take your Aspirin and any morning medicines NOT listed above.  You may use sips of water.  5. Plan for one night stay--bring personal belongings. 6. Bring a current list of your medications and current insurance cards. 7. You MUST have a responsible person to drive you home. 8. Someone MUST be with you the first 24 hours after you arrive home or your discharge will be delayed. 9. Please wear clothes  that are easy to get on and off and wear slip-on shoes.  Thank you for allowing [name] to care for you!   -- [place] [facility]

## 2021-04-04 NOTE — Telephone Encounter (Signed)
Called patient's wife NTG prescription sent to pharmacy.

## 2021-04-04 NOTE — H&P (View-Only) (Signed)
Cardiology Office Note    Date:  04/04/2021   ID:  Barry Schmidt, DOB [birth year], MRN [id number]  PCP:  [name], MD  Cardiologist:  [name], MD   New cardiology consultation referred through the courtesy of Dr. [name] for evaluation of chest tightness   History of Present Illness:  Barry Schmidt is a 60 y.o. male who is originally from [facility].  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  He had recently developed chest tightness particularly while lifting objects and is referred by Dr. [name] for cardiology consultation.  [name] has remote tobacco history and had smoked off and on for 12 years but quit in February 2019.  He has a history of diabetes mellitus for at least 10 years.  He states that he had had episodes of vertigo and staggering shortly following being vaccinated for COVID.  He underwent MRI of his head.  He states that 1 year ago he started to notice more shortness of breath when wearing a mask.  Recently, he has experienced progressive shortness of breath with activity and chest tightness when he is walking and carrying things.  He recently attended [place] [employer] and developed significant chest tightness while walking on his infield carrying a cooler.  He describes a tightness as a pressure at the weight sitting on his chest.  His symptoms dissipate with rest.  He was evaluated by Dr. [name] on April 02, 2021 and due to concern for angina pectoris cardiology consultation was requested.   Past Medical History:  Diagnosis Date   Diabetes mellitus without complication ([place])    On statin therapy due to risk of future cardiovascular event 10/14/2018   Uncontrolled diabetes mellitus without complication, with long-term current use of insulin 06/23/2018   Diagnosed in 2007; was morbidly obese at that time 260 pounds. He lost weight and did well.     He has recently started to see a retina specialist  for his left eye.  He has a history of hyperlipidemia and was recently started on rosuvastatin.  He has been on Farxiga and metformin for his diabetes mellitus.  He also has a history for neuropathy.  No past surgical history on file.  Current Medications: Outpatient Medications Prior to Visit  Medication Sig Dispense Refill   ACCU-CHEK AVIVA PLUS test strip USE AS INSTRUCTED TO TEST BLOOD GLUCOSE 100 each 12   aspirin EC 81 MG tablet Take 81 mg by mouth daily.     Blood Glucose Monitoring Suppl (ONE TOUCH ULTRA 2) w/Device KIT 1 kit by Does not apply route 2 (two) times daily. 1 each 0   dapagliflozin propanediol (FARXIGA) 10 MG TABS tablet Take 1 tablet (10 mg total) by mouth daily. 90 tablet 1   doxycycline (VIBRA-TABS) 100 MG tablet Take 1 tablet (100 mg total) by mouth 2 (two) times daily for 7 days. 14 tablet 0   Lancets (ONETOUCH ULTRASOFT) lancets Use as instructed 100 each 12   metFORMIN (GLUCOPHAGE) 1000 MG tablet Take 1 tablet (1,000 mg total) by mouth 2 (two) times daily with a meal. 180 tablet 1   rosuvastatin (CRESTOR) 10 MG tablet TAKE 1 TABLET BY MOUTH ONCE DAILY 90 tablet 3   acetaminophen-codeine (TYLENOL #3) 300-30 MG tablet Take 1 tablet by mouth every 8 (eight) hours as needed for moderate pain. 30 tablet 0   amitriptyline (ELAVIL) 25 MG tablet TAKE 1 TABLET BY MOUTH EVERYDAY AT BEDTIME 90  tablet 1   diclofenac (VOLTAREN) 75 MG EC tablet Take 1 tablet (75 mg total) by mouth 2 (two) times daily. 60 tablet 1   ondansetron (ZOFRAN) 4 MG tablet Take 1 tablet (4 mg total) by mouth every 8 (eight) hours as needed for nausea or vomiting. 20 tablet 0   No facility-administered medications prior to visit.     Allergies:   Penicillins   Social History   Socioeconomic History   Marital status: Married    Spouse name: Not on file   Number of children: Not on file   Years of education: Not on file   Highest education level: Not on file  Occupational History   Occupation:  Freight forwarder, retail    Employer: [employer]  Tobacco Use   Smoking status: Former    Pack years: 0.00    Types: Cigarettes    Quit date: 11/07/2019    Years since quitting: 1.4   Smokeless tobacco: Never  Vaping Use   Vaping Use: Former  Substance and Sexual Activity   Alcohol use: Yes    Comment: Occastional    Drug use: Never   Sexual activity: Yes    Partners: Female  Other Topics Concern   Not on file  Social History Narrative   Right handed    Caffeine 4 daily    Lives at home with wife ( [name]) and child.   Social Determinants of Health   Financial Resource Strain: Not on file  Food Insecurity: Not on file  Transportation Needs: Not on file  Physical Activity: Not on file  Stress: Not on file  Social Connections: Not on file    Socially he was born in [place].  He is in his second marriage of 64 years..  His first marriage lasted 4 years.  He has 2 children ages 48 and 25 and 2 grandchildren ages 11 and 62.  He started college but never finished.  He does not routinely exercise but is states while at work he often walks up to 18,000 steps per day.   Family History:  The patient's family history includes Diabetes in his brother, brother, and sister; Diabetes type I in his father; Healthy in his son and son; Heart attack in his father; Heart disease in his mother; Hypertension in his mother.   His mother is alive at age 65.  Father died at 25 and had diabetes and heart problems and was status post pacemaker insertion.  1 brother died at 98 and had suffered a myocardial infarction in his 1s.  He has 3 living brothers in their 40s and 2 sisters ages 79 and 55 both with diabetes.  ROS General: Negative; No fevers, chills, or night sweats;  HEENT: Recent left eye problem seeing a retina specialist.  No changes in hearing, sinus congestion, difficulty swallowing Pulmonary: Negative; No cough, wheezing, shortness of breath, hemoptysis Cardiovascular: See HPI GI:  Negative; No nausea, vomiting, diarrhea, or abdominal pain GU: Negative; No dysuria, hematuria, or difficulty voiding Musculoskeletal: Negative; no myalgias, joint pain, or weakness Hematologic/Oncology: Negative; no easy bruising, bleeding Endocrine: Negative; no heat/cold intolerance; no diabetes Neuro: Negative; no changes in balance, headaches Skin: Negative; No rashes or skin lesions Psychiatric: Negative; No behavioral problems, depression Sleep: Negative; No snoring, daytime sleepiness, hypersomnolence, bruxism, restless legs, hypnogognic hallucinations, no cataplexy Other comprehensive 14 point system review is negative.   PHYSICAL EXAM:   VS:  BP 130/72   Pulse 64   Ht _0  (1.727 m)  Wt 214 lb (97.1 kg)   SpO2 95%   BMI 32.54 kg/m     Repeat blood pressure by me was 126/70  Wt Readings from Last 3 Encounters:  04/04/21 214 lb (97.1 kg)  04/02/21 211 lb (95.7 kg)  12/09/19 195 lb (88.5 kg)    General: Alert, oriented, no distress.  Skin: normal turgor, no rashes, warm and dry HEENT: Normocephalic, atraumatic. Pupils equal round and reactive to light; sclera anicteric; extraocular muscles intact; xanthelasma over the right eye Nose without nasal septal hypertrophy Mouth/Parynx benign; Mallinpatti scale 3 Neck: No JVD, no carotid bruits; normal carotid upstroke Lungs: clear to ausculatation and percussion; no wheezing or rales Chest wall: without tenderness to palpitation Heart: PMI not displaced, RRR, s1 s2 normal, 1/6 systolic murmur, no diastolic murmur, no rubs, gallops, thrills, or heaves Abdomen: soft, nontender; no hepatosplenomehaly, BS+; abdominal aorta nontender and not dilated by palpation. Back: no CVA tenderness Pulses 2+ Musculoskeletal: full range of motion, normal strength, no joint deformities Extremities: no clubbing cyanosis or edema, Homan's sign negative  Neurologic: grossly nonfocal; Cranial nerves grossly wnl Psychologic: Normal mood and  affect   Studies/Labs Reviewed:   EKG:  EKG is ordered today.  ECG (independently read by me): NSR at 62, IRBBB, PRWP   Recent Labs: BMP Latest Ref Rng & Units 04/02/2021 12/09/2019 06/03/2019  Glucose 70 - 99 mg/dL 85 106(H) 101(H)  BUN 6 - 23 mg/dL _0 Creatinine 0.40 - 1.50 mg/dL 0.95 0.88 0.79  BUN/Creat Ratio 9 - 20 - - -  Sodium 135 - 145 mEq/L 142 139 140  Potassium 3.5 - 5.1 mEq/L 4.1 4.2 4.2  Chloride 96 - 112 mEq/L 105 100 104  CO2 19 - 32 mEq/L _1 Calcium 8.4 - 10.5 mg/dL 9.8 9.3 9.4     Hepatic Function Latest Ref Rng & Units 04/02/2021 12/09/2019 06/03/2019  Total Protein 6.0 - 8.3 g/dL 7.5 6.7 6.9  Albumin 3.5 - 5.2 g/dL 4.7 4.4 4.6  AST 0 - 37 U/L _2 ALT 0 - 53 U/L _3 Alk Phosphatase 39 - 117 U/L 67 84 76  Total Bilirubin 0.2 - 1.2 mg/dL 0.4 0.4 0.3    CBC Latest Ref Rng & Units 04/02/2021 03/12/2020 12/09/2019  WBC 4.0 - 10.5 K/uL 7.5 8.1 6.1  Hemoglobin 13.0 - 17.0 g/dL 14.9 14.9 14.5  Hematocrit 39.0 - 52.0 % 43.7 43.6 42.1  Platelets 150.0 - 400.0 K/uL 217.0 182 182.0   Lab Results  Component Value Date   MCV 85.3 04/02/2021   MCV 88.8 03/12/2020   MCV 88.0 12/09/2019   Lab Results  Component Value Date   TSH 6.70 (H) 04/02/2021   Lab Results  Component Value Date   HGBA1C 7.0 (A) 04/02/2021     BNP No results found for: BNP  ProBNP No results found for: PROBNP   Lipid Panel     Component Value Date/Time   CHOL 110 04/02/2021 1219   TRIG 87.0 04/02/2021 1219   HDL 51.10 04/02/2021 1219   CHOLHDL 2 04/02/2021 1219   VLDL 17.4 04/02/2021 1219   LDLCALC 42 04/02/2021 1219   LDLDIRECT 90.0 06/23/2018 1113     RADIOLOGY: No results found.   Additional studies/ records that were reviewed today include:  I reviewed the recent evaluation by Dr. [name].  I also reviewed his [facility] emergency room evaluation in 2019 when he presented with right leg pain and  was felt to have sciatica.   ASSESSMENT:    1.  Exertional angina (HCC)   2. Exertional dyspnea   3. Mixed hyperlipidemia   4. Type 2 diabetes mellitus without complication, without long-term current use of insulin ([place])   5. Xanthelasma of right eyelid   6. History of tobacco use: Quit November 25, 2019    PLAN:  Mr. Malakye Nolden is a 60 year old gentleman who has a prior tobacco history and fortunately quit on his birthday [birth year] 1-1/2 years ago.  He has a history of diabetes mellitus and on exam he has evidence for xanthelasma above his right eye.  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  Recently he has noted definite exertional chest tightness when he is carrying objects.  His symptoms became fairly intense when he was at [facility] and was carrying a cooler across the infield where he had to stop on several times to catch his breath.  He denies any resting chest wall discomfort.  Cardiac risk factors are notable for prior tobacco use, diabetes mellitus, hyperlipidemia, and family history for CAD.  I had a very lengthy discussion today with both he and his wife.  He is certain that he first experiences chest tightness and then experiences shortness of breath.  I am concerned that his symptoms may be ischemia mediated.  I am scheduling him to undergo 2D echo Doppler study.  We discussed multiple scenarios of evaluation including noninvasive evaluation with coronary CTA imaging versus nuclear stress testing versus definitive cardiac catheterization.  He tells me he will be going on vacation the week of July 9 through July 16.  With his recurrent symptomatology, after much discussion I have recommended definitive cardiac catheterization.  This will provide him with a definitive answer regarding his coronary anatomy and can be done more expeditiously compared to coronary CTA imaging.  I am giving him a prescription for metoprolol succinate 25 mg which will be helpful to reduce potential  exertional symptomatology.  He also giving him a prescription for sublingual nitroglycerin.  I will schedule him to undergo his echo Doppler study prior to his vacation.  I will plan to perform definitive cardiac catheterization upon his return from vacation and this has been tentatively scheduled for April 24, 2021.  Prior to his return from vacation he will undergo complete set of fasting laboratory.I have reviewed the risks, indications, and alternatives to cardiac catheterization, possible angioplasty, and stenting with the patient. Risks include but are not limited to bleeding, infection, vascular injury, stroke, myocardial infection, arrhythmia, kidney injury, radiation-related injury in the case of prolonged fluoroscopy use, emergency cardiac surgery, and death. The patient understands the risks of serious complication is 1-2 in 9147 with diagnostic cardiac cath and 1-2% or less with angioplasty/stenting.  I answered all the patient's questions and I will contact him regarding his echo Doppler study and laboratory and see him on July 20 for the cardiac catheterization evaluation.   Medication Adjustments/Labs and Tests Ordered: Current medicines are reviewed at length with the patient today.  Concerns regarding medicines are outlined above.  Medication changes, Labs and Tests ordered today are listed in the Patient Instructions below. Patient Instructions  Medication Instructions:  BEGIN metoprolol succinate (Toprol XL) 28m daily. Per Dr. KClaiborne Billingsif your heart rate gets too low you may take 12.540m(1/2 tablet) instead.   *If you need a refill on your cardiac medications before your next appointment,  please call your pharmacy*   Lab Work: CBC, CMET, TSH to be drawn Monday July 18.  If you have labs (blood work) drawn today and your tests are completely normal, you will receive your results only by: [place] (if you have MyChart) OR A paper copy in the mail If you have any lab test that  is abnormal or we need to change your treatment, we will call you to review the results.   Testing/Procedures: Your physician has requested that you have a cardiac catheterization. Cardiac catheterization is used to diagnose and/or treat various heart conditions. Doctors may recommend this procedure for a number of different reasons. The most common reason is to evaluate chest pain. Chest pain can be a symptom of coronary artery disease (CAD), and cardiac catheterization can show whether plaque is narrowing or blocking your heart's arteries. This procedure is also used to evaluate the valves, as well as measure the blood flow and oxygen levels in different parts of your heart. For further information please visit [email or web address].[email or web address]. Please follow instruction sheet, as given.    Follow-Up: At [facility], you and your health needs are our priority.  As part of our continuing mission to provide you with exceptional heart care, we have created designated Provider Care Teams.  These Care Teams include your primary Cardiologist (physician) and Advanced Practice Providers (APPs -  Physician Assistants and Nurse Practitioners) who all work together to provide you with the care you need, when you need it.  We recommend signing up for the patient portal called "MyChart".  Sign up information is provided on this After Visit Summary.  MyChart is used to connect with patients for Virtual Visits (Telemedicine).  Patients are able to view lab/test results, encounter notes, upcoming appointments, etc.  Non-urgent messages can be sent to your provider as well.   To learn more about what you can do with MyChart, go to [email or web address].    Your next appointment:   Post cath  The format for your next appointment:   In Person  Provider:   [name], MD   Other Instructions    [facility] [facility]  [place] [address] Dept: [phone] Loc: [phone]  Amond Speranza  04/04/2021  You are scheduled for a Cardiac Catheterization on Wednesday, July 20 with Dr. [name].  1. Please arrive at the [facility] (Main Entrance A) at [facility]: [address] [place], [place] [address] at 8:00 AM (This time is two hours before your procedure to ensure your preparation). Free valet parking service is available.   Special note: Every effort is made to have your procedure done on time. Please understand that emergencies sometimes delay scheduled procedures.  2. Diet: Do not eat solid foods after midnight.  The patient may have clear liquids until 5am upon the day of the procedure.  3. Labs: You will need to have blood drawn on July 18  4. Medication instructions in preparation for your procedure:  Do not take Diabetes Med Glucophage (Metformin) on the day of the procedure and HOLD 48 HOURS AFTER THE PROCEDURE.  On the morning of your procedure, take your Aspirin and any morning medicines NOT listed above.  You may use sips of water.  5. Plan for one night stay--bring personal belongings. 6. Bring a current list of your medications and current insurance cards. 7. You MUST have a responsible person to drive you home. 8. Someone  MUST be with you the first 24 hours after you arrive home or your discharge will be delayed. 9. Please wear clothes that are easy to get on and off and wear slip-on shoes.  Thank you for allowing [name] to care for you!   -- [facility]    Signed, [name], MD  04/04/2021 [facility] [address] [place], [place], [place]  [address] Phone: [phone]

## 2021-04-04 NOTE — Telephone Encounter (Signed)
Error

## 2021-04-04 NOTE — Telephone Encounter (Signed)
*  STAT* If patient is at the pharmacy, call can be transferred to refill team.   1. Which medications need to be refilled? (please list name of each medication and dose if known) Nitroglycerin   2. Which pharmacy/location (including street and city if local pharmacy) is medication to be sent to? [id number] - [place], [place] - [address]  3. Do they need a 30 day or 90 day supply?   Emergency supply Patient's wife states during patient's appointment this morning he was told he would be prescribed Nitro to take when he has chest pain, but the pharmacy hasn't received the Rx.

## 2021-04-09 ENCOUNTER — Telehealth: Payer: Self-pay | Admitting: Cardiovascular Disease

## 2021-04-09 NOTE — Telephone Encounter (Signed)
Pt calling today to be sure he needs an echo prior to his heart cath. Upon review of Dr. Landry Dyke note, he would like his echo to be performed a few days prior to his cath. I reviewed this with the patient and rationale behind this. He has verbalized understanding and had no additional questions.

## 2021-04-09 NOTE — Telephone Encounter (Signed)
Pt is currently scheduled for a heart Cath on 04/24/21 and a Echo on 04/22/21. Pt states Dr. Tresa Endo told him that since he was having a Heart Cath, he did not need to Echo.  Pt wants to know if he should cancel the Echo. Please advise patient further

## 2021-04-15 ENCOUNTER — Other Ambulatory Visit: Payer: Self-pay

## 2021-04-15 ENCOUNTER — Telehealth: Payer: Self-pay

## 2021-04-15 DIAGNOSIS — R7989 Other specified abnormal findings of blood chemistry: Secondary | ICD-10-CM

## 2021-04-15 NOTE — Telephone Encounter (Signed)
LMOVM to return call for lab results  

## 2021-04-16 ENCOUNTER — Other Ambulatory Visit (HOSPITAL_COMMUNITY): Payer: BC Managed Care – PPO

## 2021-04-22 ENCOUNTER — Ambulatory Visit (HOSPITAL_BASED_OUTPATIENT_CLINIC_OR_DEPARTMENT_OTHER)
Admission: RE | Admit: 2021-04-22 | Discharge: 2021-04-22 | Disposition: A | Discharge status: Home / Self Care | Payer: BC Managed Care – PPO | Source: Ambulatory Visit | Attending: Cardiovascular Disease | Admitting: Cardiovascular Disease

## 2021-04-22 ENCOUNTER — Other Ambulatory Visit: Payer: Self-pay

## 2021-04-22 DIAGNOSIS — J9811 Atelectasis: Secondary | ICD-10-CM | POA: Diagnosis not present

## 2021-04-22 DIAGNOSIS — Z20822 Contact with and (suspected) exposure to covid-19: Secondary | ICD-10-CM | POA: Diagnosis not present

## 2021-04-22 DIAGNOSIS — I25700 Atherosclerosis of coronary artery bypass graft(s), unspecified, with unstable angina pectoris: Secondary | ICD-10-CM | POA: Diagnosis not present

## 2021-04-22 DIAGNOSIS — Z87891 Personal history of nicotine dependence: Secondary | ICD-10-CM | POA: Diagnosis not present

## 2021-04-22 DIAGNOSIS — E876 Hypokalemia: Secondary | ICD-10-CM | POA: Diagnosis not present

## 2021-04-22 DIAGNOSIS — Z833 Family history of diabetes mellitus: Secondary | ICD-10-CM | POA: Diagnosis not present

## 2021-04-22 DIAGNOSIS — E119 Type 2 diabetes mellitus without complications: Secondary | ICD-10-CM | POA: Insufficient documentation

## 2021-04-22 DIAGNOSIS — I2582 Chronic total occlusion of coronary artery: Secondary | ICD-10-CM | POA: Diagnosis not present

## 2021-04-22 DIAGNOSIS — I517 Cardiomegaly: Secondary | ICD-10-CM | POA: Diagnosis not present

## 2021-04-22 DIAGNOSIS — Z880 Allergy status to penicillin: Secondary | ICD-10-CM | POA: Diagnosis not present

## 2021-04-22 DIAGNOSIS — H0263 Xanthelasma of right eye, unspecified eyelid: Secondary | ICD-10-CM | POA: Diagnosis not present

## 2021-04-22 DIAGNOSIS — Z8249 Family history of ischemic heart disease and other diseases of the circulatory system: Secondary | ICD-10-CM | POA: Diagnosis not present

## 2021-04-22 DIAGNOSIS — Z7982 Long term (current) use of aspirin: Secondary | ICD-10-CM | POA: Diagnosis not present

## 2021-04-22 DIAGNOSIS — D62 Acute posthemorrhagic anemia: Secondary | ICD-10-CM | POA: Diagnosis not present

## 2021-04-22 DIAGNOSIS — Z7984 Long term (current) use of oral hypoglycemic drugs: Secondary | ICD-10-CM | POA: Diagnosis not present

## 2021-04-22 DIAGNOSIS — J90 Pleural effusion, not elsewhere classified: Secondary | ICD-10-CM | POA: Diagnosis not present

## 2021-04-22 DIAGNOSIS — I088 Other rheumatic multiple valve diseases: Secondary | ICD-10-CM | POA: Diagnosis not present

## 2021-04-22 DIAGNOSIS — Z01810 Encounter for preprocedural cardiovascular examination: Secondary | ICD-10-CM | POA: Diagnosis not present

## 2021-04-22 DIAGNOSIS — I951 Orthostatic hypotension: Secondary | ICD-10-CM | POA: Diagnosis not present

## 2021-04-22 DIAGNOSIS — Z794 Long term (current) use of insulin: Secondary | ICD-10-CM | POA: Diagnosis not present

## 2021-04-22 DIAGNOSIS — I25118 Atherosclerotic heart disease of native coronary artery with other forms of angina pectoris: Secondary | ICD-10-CM | POA: Diagnosis not present

## 2021-04-22 DIAGNOSIS — I208 Other forms of angina pectoris: Secondary | ICD-10-CM

## 2021-04-22 DIAGNOSIS — Z79899 Other long term (current) drug therapy: Secondary | ICD-10-CM | POA: Diagnosis not present

## 2021-04-22 DIAGNOSIS — E7800 Pure hypercholesterolemia, unspecified: Secondary | ICD-10-CM | POA: Diagnosis not present

## 2021-04-22 DIAGNOSIS — I25110 Atherosclerotic heart disease of native coronary artery with unstable angina pectoris: Secondary | ICD-10-CM | POA: Diagnosis not present

## 2021-04-22 DIAGNOSIS — E1165 Type 2 diabetes mellitus with hyperglycemia: Secondary | ICD-10-CM | POA: Diagnosis not present

## 2021-04-22 DIAGNOSIS — R079 Chest pain, unspecified: Secondary | ICD-10-CM | POA: Diagnosis not present

## 2021-04-22 DIAGNOSIS — E1140 Type 2 diabetes mellitus with diabetic neuropathy, unspecified: Secondary | ICD-10-CM | POA: Diagnosis not present

## 2021-04-22 DIAGNOSIS — E782 Mixed hyperlipidemia: Secondary | ICD-10-CM | POA: Diagnosis not present

## 2021-04-22 DIAGNOSIS — I509 Heart failure, unspecified: Secondary | ICD-10-CM | POA: Diagnosis not present

## 2021-04-22 DIAGNOSIS — I959 Hypotension, unspecified: Secondary | ICD-10-CM | POA: Diagnosis not present

## 2021-04-22 LAB — CBC
Hematocrit: 44.1 % (ref 37.5–51.0)
Hemoglobin: 14.4 g/dL (ref 13.0–17.7)
MCH: 28.2 pg (ref 26.6–33.0)
MCHC: 32.7 g/dL (ref 31.5–35.7)
MCV: 87 fL (ref 79–97)
Platelets: 210 10*3/uL (ref 150–450)
RBC: 5.1 x10E6/uL (ref 4.14–5.80)
RDW: 13.3 % (ref 11.6–15.4)
WBC: 5.3 10*3/uL (ref 3.4–10.8)

## 2021-04-22 LAB — COMPREHENSIVE METABOLIC PANEL
ALT: 19 IU/L (ref 0–44)
AST: 17 IU/L (ref 0–40)
Albumin/Globulin Ratio: 2 (ref 1.2–2.2)
Albumin: 4.5 g/dL (ref 3.8–4.9)
Alkaline Phosphatase: 73 IU/L (ref 44–121)
BUN/Creatinine Ratio: 20 (ref 9–20)
BUN: 17 mg/dL (ref 6–24)
Bilirubin Total: <0.2 mg/dL (ref 0.0–1.2)
CO2: 24 mmol/L (ref 20–29)
Calcium: 9.5 mg/dL (ref 8.7–10.2)
Chloride: 102 mmol/L (ref 96–106)
Creatinine, Ser: 0.87 mg/dL (ref 0.76–1.27)
Globulin, Total: 2.2 g/dL (ref 1.5–4.5)
Glucose: 126 mg/dL — ABNORMAL HIGH (ref 65–99)
Potassium: 4.8 mmol/L (ref 3.5–5.2)
Sodium: 142 mmol/L (ref 134–144)
Total Protein: 6.7 g/dL (ref 6.0–8.5)
eGFR: 100 mL/min/{1.73_m2} (ref >59)

## 2021-04-22 LAB — TSH: TSH: 3.69 u[IU]/mL (ref 0.450–4.500)

## 2021-04-22 LAB — ECHOCARDIOGRAM COMPLETE
Area-P 1/2: 3.21 cm2
S' Lateral: 3.1 cm

## 2021-04-22 MED ORDER — PERFLUTREN LIPID MICROSPHERE
1.0000 mL | INTRAVENOUS | Status: AC | PRN
  Administered 2021-04-22: 2 mL via INTRAVENOUS
  Filled 2021-04-22: qty 10

## 2021-04-23 ENCOUNTER — Telehealth: Payer: Self-pay | Admitting: *Deleted

## 2021-04-23 NOTE — Telephone Encounter (Signed)
Pt contacted pre-catheterization scheduled at [facility] for: Wednesday April 24, 2021 10 AM Verified arrival time and place: [facility] Main Entrance A [facility]) at:   No solid food after midnight prior to cath, clear liquids until 5 AM day of procedure.  Hold: Metformin-day of procedure and 48 hours post procedure  Farxiga-AM of procedure  Except hold medications AM meds can be  taken pre-cath with sips of water including: aspirin 81 mg   Confirmed patient has responsible adult to drive home post procedure and be with patient first 24 hours after arriving home: yes  You are allowed ONE visitor in the waiting room during the time you are at the hospital for your procedure. Both you and your visitor must wear a mask once you enter the hospital.   Patient reports does not currently have any symptoms concerning for COVID-19 and no household members with COVID-19 like illness.       Reviewed procedure/mask/visitor instructions with patient.

## 2021-04-24 ENCOUNTER — Inpatient Hospital Stay (HOSPITAL_COMMUNITY): Payer: BC Managed Care – PPO

## 2021-04-24 ENCOUNTER — Encounter (HOSPITAL_COMMUNITY): Payer: Self-pay | Admitting: Cardiovascular Disease

## 2021-04-24 ENCOUNTER — Other Ambulatory Visit: Payer: Self-pay

## 2021-04-24 ENCOUNTER — Inpatient Hospital Stay (HOSPITAL_COMMUNITY)
Admission: RE | Admit: 2021-04-24 | Discharge: 2021-05-01 | DRG: 234 | Disposition: A | Discharge status: Home / Self Care | Payer: BC Managed Care – PPO | Attending: Thoracic Surgery (Cardiothoracic Vascular Surgery) | Admitting: Thoracic Surgery (Cardiothoracic Vascular Surgery)

## 2021-04-24 ENCOUNTER — Encounter (HOSPITAL_COMMUNITY)
Admission: RE | Disposition: A | Discharge status: Home / Self Care | Payer: Self-pay | Source: Home / Self Care | Attending: Thoracic Surgery (Cardiothoracic Vascular Surgery)

## 2021-04-24 DIAGNOSIS — E7800 Pure hypercholesterolemia, unspecified: Secondary | ICD-10-CM | POA: Diagnosis not present

## 2021-04-24 DIAGNOSIS — Z833 Family history of diabetes mellitus: Secondary | ICD-10-CM

## 2021-04-24 DIAGNOSIS — E876 Hypokalemia: Secondary | ICD-10-CM | POA: Diagnosis not present

## 2021-04-24 DIAGNOSIS — H0263 Xanthelasma of right eye, unspecified eyelid: Secondary | ICD-10-CM | POA: Diagnosis present

## 2021-04-24 DIAGNOSIS — E1165 Type 2 diabetes mellitus with hyperglycemia: Secondary | ICD-10-CM | POA: Diagnosis present

## 2021-04-24 DIAGNOSIS — Z87891 Personal history of nicotine dependence: Secondary | ICD-10-CM | POA: Diagnosis not present

## 2021-04-24 DIAGNOSIS — E782 Mixed hyperlipidemia: Secondary | ICD-10-CM | POA: Diagnosis present

## 2021-04-24 DIAGNOSIS — I2089 Other forms of angina pectoris: Secondary | ICD-10-CM

## 2021-04-24 DIAGNOSIS — I959 Hypotension, unspecified: Secondary | ICD-10-CM | POA: Diagnosis not present

## 2021-04-24 DIAGNOSIS — Z794 Long term (current) use of insulin: Secondary | ICD-10-CM | POA: Diagnosis not present

## 2021-04-24 DIAGNOSIS — Z01810 Encounter for preprocedural cardiovascular examination: Secondary | ICD-10-CM

## 2021-04-24 DIAGNOSIS — D62 Acute posthemorrhagic anemia: Secondary | ICD-10-CM | POA: Diagnosis not present

## 2021-04-24 DIAGNOSIS — Z880 Allergy status to penicillin: Secondary | ICD-10-CM

## 2021-04-24 DIAGNOSIS — Z20822 Contact with and (suspected) exposure to covid-19: Secondary | ICD-10-CM | POA: Diagnosis present

## 2021-04-24 DIAGNOSIS — I25110 Atherosclerotic heart disease of native coronary artery with unstable angina pectoris: Principal | ICD-10-CM | POA: Diagnosis present

## 2021-04-24 DIAGNOSIS — I2582 Chronic total occlusion of coronary artery: Secondary | ICD-10-CM | POA: Diagnosis present

## 2021-04-24 DIAGNOSIS — I25700 Atherosclerosis of coronary artery bypass graft(s), unspecified, with unstable angina pectoris: Secondary | ICD-10-CM | POA: Diagnosis not present

## 2021-04-24 DIAGNOSIS — Z951 Presence of aortocoronary bypass graft: Secondary | ICD-10-CM

## 2021-04-24 DIAGNOSIS — Z8249 Family history of ischemic heart disease and other diseases of the circulatory system: Secondary | ICD-10-CM

## 2021-04-24 DIAGNOSIS — I208 Other forms of angina pectoris: Secondary | ICD-10-CM

## 2021-04-24 DIAGNOSIS — E1140 Type 2 diabetes mellitus with diabetic neuropathy, unspecified: Secondary | ICD-10-CM | POA: Diagnosis present

## 2021-04-24 DIAGNOSIS — Z7982 Long term (current) use of aspirin: Secondary | ICD-10-CM

## 2021-04-24 DIAGNOSIS — I5030 Unspecified diastolic (congestive) heart failure: Secondary | ICD-10-CM

## 2021-04-24 DIAGNOSIS — I25118 Atherosclerotic heart disease of native coronary artery with other forms of angina pectoris: Secondary | ICD-10-CM | POA: Diagnosis not present

## 2021-04-24 DIAGNOSIS — I2510 Atherosclerotic heart disease of native coronary artery without angina pectoris: Secondary | ICD-10-CM | POA: Diagnosis present

## 2021-04-24 DIAGNOSIS — Z79899 Other long term (current) drug therapy: Secondary | ICD-10-CM | POA: Diagnosis not present

## 2021-04-24 DIAGNOSIS — Z7984 Long term (current) use of oral hypoglycemic drugs: Secondary | ICD-10-CM | POA: Diagnosis not present

## 2021-04-24 DIAGNOSIS — I951 Orthostatic hypotension: Secondary | ICD-10-CM | POA: Diagnosis not present

## 2021-04-24 HISTORY — PX: LEFT HEART CATH AND CORONARY ANGIOGRAPHY: CATH118249

## 2021-04-24 LAB — URINALYSIS, ROUTINE W REFLEX MICROSCOPIC
Bacteria, UA: NONE SEEN
Bilirubin Urine: NEGATIVE
Glucose, UA: >=500 mg/dL — AB
Hgb urine dipstick: NEGATIVE
Ketones, ur: 5 mg/dL — AB
Leukocytes,Ua: NEGATIVE
Nitrite: NEGATIVE
Protein, ur: NEGATIVE mg/dL
Specific Gravity, Urine: 1.04 — ABNORMAL HIGH (ref 1.005–1.030)
pH: 5 (ref 5.0–8.0)

## 2021-04-24 LAB — GLUCOSE, CAPILLARY
Glucose-Capillary: 121 mg/dL — ABNORMAL HIGH (ref 70–99)
Glucose-Capillary: 118 mg/dL — ABNORMAL HIGH (ref 70–99)
Glucose-Capillary: 108 mg/dL — ABNORMAL HIGH (ref 70–99)
Glucose-Capillary: 132 mg/dL — ABNORMAL HIGH (ref 70–99)
Glucose-Capillary: 188 mg/dL — ABNORMAL HIGH (ref 70–99)

## 2021-04-24 LAB — TYPE AND SCREEN
ABO/RH(D): B POS
Antibody Screen: NEGATIVE

## 2021-04-24 LAB — ABO/RH: ABO/RH(D): B POS

## 2021-04-24 SURGERY — LEFT HEART CATH AND CORONARY ANGIOGRAPHY
Anesthesia: LOCAL

## 2021-04-24 MED ORDER — METOPROLOL TARTRATE 12.5 MG HALF TABLET: 25.0000 mg | ORAL_TABLET | Freq: Two times a day (BID) | ORAL | Status: DC

## 2021-04-24 MED ORDER — POTASSIUM CHLORIDE 2 MEQ/ML IV SOLN
80.0000 meq | INTRAVENOUS | Status: DC
  Filled 2021-04-24: qty 40

## 2021-04-24 MED ORDER — INSULIN ASPART 100 UNIT/ML IJ SOLN
0.0000 [IU] | Freq: Three times a day (TID) | INTRAMUSCULAR | Status: DC
  Administered 2021-04-24: 2 [IU] via SUBCUTANEOUS

## 2021-04-24 MED ORDER — TRANEXAMIC ACID (OHS) BOLUS VIA INFUSION
15.0000 mg/kg | INTRAVENOUS | Status: AC
  Administered 2021-04-25: 1459.5 mg via INTRAVENOUS
  Filled 2021-04-24: qty 1460

## 2021-04-24 MED ORDER — MAGNESIUM SULFATE 50 % IJ SOLN
40.0000 meq | INTRAMUSCULAR | Status: DC
  Filled 2021-04-24: qty 9.85

## 2021-04-24 MED ORDER — SODIUM CHLORIDE 0.9% FLUSH: 3.0000 mL | Freq: Two times a day (BID) | INTRAVENOUS | Status: DC

## 2021-04-24 MED ORDER — SODIUM CHLORIDE 0.9 % WEIGHT BASED INFUSION
3.0000 mL/kg/h | INTRAVENOUS | Status: DC
  Administered 2021-04-24: 3 mL/kg/h via INTRAVENOUS

## 2021-04-24 MED ORDER — HEPARIN (PORCINE) IN NACL 1000-0.9 UT/500ML-% IV SOLN
INTRAVENOUS | Status: DC | PRN
  Administered 2021-04-24 (×2): 500 mL

## 2021-04-24 MED ORDER — VANCOMYCIN HCL 1500 MG/300ML IV SOLN
1500.0000 mg | INTRAVENOUS | Status: AC
  Administered 2021-04-25: 1500 mg via INTRAVENOUS
  Filled 2021-04-24: qty 300

## 2021-04-24 MED ORDER — SODIUM CHLORIDE 0.9 % IV SOLN: 250.0000 mL | INTRAVENOUS | Status: DC | PRN

## 2021-04-24 MED ORDER — CEFAZOLIN SODIUM-DEXTROSE 2-4 GM/100ML-% IV SOLN
2.0000 g | INTRAVENOUS | Status: AC
  Administered 2021-04-25: 2 g via INTRAVENOUS
  Filled 2021-04-24: qty 100

## 2021-04-24 MED ORDER — HEPARIN SODIUM (PORCINE) 1000 UNIT/ML IJ SOLN
INTRAMUSCULAR | Status: AC
  Filled 2021-04-24: qty 1

## 2021-04-24 MED ORDER — ONDANSETRON HCL 4 MG/2ML IJ SOLN: 4.0000 mg | Freq: Four times a day (QID) | INTRAMUSCULAR | Status: DC | PRN

## 2021-04-24 MED ORDER — VERAPAMIL HCL 2.5 MG/ML IV SOLN
INTRAVENOUS | Status: AC
  Filled 2021-04-24: qty 2

## 2021-04-24 MED ORDER — EPINEPHRINE HCL 5 MG/250ML IV SOLN IN NS
0.0000 ug/min | INTRAVENOUS | Status: DC
  Filled 2021-04-24: qty 250

## 2021-04-24 MED ORDER — HEPARIN (PORCINE) IN NACL 1000-0.9 UT/500ML-% IV SOLN
INTRAVENOUS | Status: AC
  Filled 2021-04-24: qty 500

## 2021-04-24 MED ORDER — ASPIRIN 81 MG PO CHEW: 81.0000 mg | CHEWABLE_TABLET | ORAL | Status: DC

## 2021-04-24 MED ORDER — CHLORHEXIDINE GLUCONATE CLOTH 2 % EX PADS
6.0000 | MEDICATED_PAD | Freq: Once | CUTANEOUS | Status: AC
  Administered 2021-04-25: 6 via TOPICAL

## 2021-04-24 MED ORDER — IOHEXOL 350 MG/ML SOLN
INTRAVENOUS | Status: DC | PRN
  Administered 2021-04-24: 50 mL via INTRA_ARTERIAL

## 2021-04-24 MED ORDER — VERAPAMIL HCL 2.5 MG/ML IV SOLN
INTRAVENOUS | Status: DC | PRN
  Administered 2021-04-24: 10 mL via INTRA_ARTERIAL

## 2021-04-24 MED ORDER — SODIUM CHLORIDE 0.9% FLUSH
3.0000 mL | Freq: Two times a day (BID) | INTRAVENOUS | Status: DC
  Administered 2021-04-24 (×2): 3 mL via INTRAVENOUS

## 2021-04-24 MED ORDER — PLASMA-LYTE A IV SOLN
INTRAVENOUS | Status: DC
  Filled 2021-04-24: qty 5

## 2021-04-24 MED ORDER — ISOSORBIDE MONONITRATE ER 30 MG PO TB24
30.0000 mg | ORAL_TABLET | Freq: Every day | ORAL | Status: DC
  Administered 2021-04-24: 30 mg via ORAL
  Filled 2021-04-24: qty 1

## 2021-04-24 MED ORDER — DEXMEDETOMIDINE HCL IN NACL 400 MCG/100ML IV SOLN
0.1000 ug/kg/h | INTRAVENOUS | Status: AC
  Administered 2021-04-25: .5 ug/kg/h via INTRAVENOUS
  Filled 2021-04-24 (×2): qty 100

## 2021-04-24 MED ORDER — ROSUVASTATIN CALCIUM 20 MG PO TABS
40.0000 mg | ORAL_TABLET | Freq: Every day | ORAL | Status: DC
  Administered 2021-04-24 – 2021-05-01 (×7): 40 mg via ORAL
  Filled 2021-04-24: qty 1
  Filled 2021-04-24 (×7): qty 2

## 2021-04-24 MED ORDER — TRANEXAMIC ACID 1000 MG/10ML IV SOLN
1.5000 mg/kg/h | INTRAVENOUS | Status: AC
  Administered 2021-04-25: 1.5 mg/kg/h via INTRAVENOUS
  Filled 2021-04-24: qty 25

## 2021-04-24 MED ORDER — CHLORHEXIDINE GLUCONATE CLOTH 2 % EX PADS
6.0000 | MEDICATED_PAD | Freq: Once | CUTANEOUS | Status: AC
  Administered 2021-04-24: 6 via TOPICAL

## 2021-04-24 MED ORDER — LABETALOL HCL 5 MG/ML IV SOLN: 10.0000 mg | INTRAVENOUS | Status: AC | PRN

## 2021-04-24 MED ORDER — FENTANYL CITRATE (PF) 100 MCG/2ML IJ SOLN
INTRAMUSCULAR | Status: AC
  Filled 2021-04-24: qty 2

## 2021-04-24 MED ORDER — MIDAZOLAM HCL 2 MG/2ML IJ SOLN
INTRAMUSCULAR | Status: AC
  Filled 2021-04-24: qty 2

## 2021-04-24 MED ORDER — ASPIRIN 81 MG PO CHEW: 81.0000 mg | CHEWABLE_TABLET | Freq: Every day | ORAL | Status: DC

## 2021-04-24 MED ORDER — CHLORHEXIDINE GLUCONATE 0.12 % MT SOLN
15.0000 mL | Freq: Once | OROMUCOSAL | Status: AC
  Administered 2021-04-25: 15 mL via OROMUCOSAL
  Filled 2021-04-24: qty 15

## 2021-04-24 MED ORDER — MIDAZOLAM HCL 2 MG/2ML IJ SOLN
INTRAMUSCULAR | Status: DC | PRN
  Administered 2021-04-24: 2 mg via INTRAVENOUS

## 2021-04-24 MED ORDER — FENTANYL CITRATE (PF) 100 MCG/2ML IJ SOLN
INTRAMUSCULAR | Status: DC | PRN
  Administered 2021-04-24: 25 ug via INTRAVENOUS

## 2021-04-24 MED ORDER — SODIUM CHLORIDE 0.9% FLUSH: 3.0000 mL | INTRAVENOUS | Status: DC | PRN

## 2021-04-24 MED ORDER — SODIUM CHLORIDE 0.9 % IV SOLN
INTRAVENOUS | Status: DC
  Filled 2021-04-24: qty 30

## 2021-04-24 MED ORDER — MILRINONE LACTATE IN DEXTROSE 20-5 MG/100ML-% IV SOLN
0.3000 ug/kg/min | INTRAVENOUS | Status: DC
  Filled 2021-04-24: qty 100

## 2021-04-24 MED ORDER — ACETAMINOPHEN 325 MG PO TABS
650.0000 mg | ORAL_TABLET | ORAL | Status: DC | PRN
  Administered 2021-04-24 (×2): 650 mg via ORAL
  Filled 2021-04-24: qty 2

## 2021-04-24 MED ORDER — TEMAZEPAM 15 MG PO CAPS
15.0000 mg | ORAL_CAPSULE | Freq: Once | ORAL | Status: AC | PRN
  Administered 2021-04-24: 15 mg via ORAL
  Filled 2021-04-24: qty 1

## 2021-04-24 MED ORDER — NOREPINEPHRINE 4 MG/250ML-% IV SOLN
0.0000 ug/min | INTRAVENOUS | Status: DC
  Filled 2021-04-24: qty 250

## 2021-04-24 MED ORDER — ACETAMINOPHEN 325 MG PO TABS
ORAL_TABLET | ORAL | Status: AC
  Filled 2021-04-24: qty 2

## 2021-04-24 MED ORDER — METOPROLOL TARTRATE 12.5 MG HALF TABLET
25.0000 mg | ORAL_TABLET | Freq: Two times a day (BID) | ORAL | Status: DC
  Administered 2021-04-24: 25 mg via ORAL
  Filled 2021-04-24: qty 2

## 2021-04-24 MED ORDER — LIDOCAINE HCL (PF) 1 % IJ SOLN
INTRAMUSCULAR | Status: DC | PRN
  Administered 2021-04-24: 2 mL

## 2021-04-24 MED ORDER — HYDRALAZINE HCL 20 MG/ML IJ SOLN: 10.0000 mg | INTRAMUSCULAR | Status: AC | PRN

## 2021-04-24 MED ORDER — PHENYLEPHRINE HCL-NACL 20-0.9 MG/250ML-% IV SOLN
30.0000 ug/min | INTRAVENOUS | Status: AC
  Administered 2021-04-25 (×2): 20 ug/min via INTRAVENOUS
  Filled 2021-04-24: qty 250

## 2021-04-24 MED ORDER — TRANEXAMIC ACID (OHS) PUMP PRIME SOLUTION
2.0000 mg/kg | INTRAVENOUS | Status: DC
  Filled 2021-04-24: qty 1.95

## 2021-04-24 MED ORDER — BISACODYL 5 MG PO TBEC
5.0000 mg | DELAYED_RELEASE_TABLET | Freq: Once | ORAL | Status: AC
  Administered 2021-04-24: 5 mg via ORAL
  Filled 2021-04-24: qty 1

## 2021-04-24 MED ORDER — HEPARIN SODIUM (PORCINE) 1000 UNIT/ML IJ SOLN
INTRAMUSCULAR | Status: DC | PRN
  Administered 2021-04-24: 5000 [IU] via INTRAVENOUS

## 2021-04-24 MED ORDER — SODIUM CHLORIDE 0.9 % WEIGHT BASED INFUSION: 1.0000 mL/kg/h | INTRAVENOUS | Status: DC

## 2021-04-24 MED ORDER — METOPROLOL TARTRATE 12.5 MG HALF TABLET
12.5000 mg | ORAL_TABLET | Freq: Once | ORAL | Status: AC
  Administered 2021-04-25: 12.5 mg via ORAL
  Filled 2021-04-24: qty 1

## 2021-04-24 MED ORDER — LORAZEPAM 0.5 MG PO TABS
0.5000 mg | ORAL_TABLET | Freq: Three times a day (TID) | ORAL | Status: DC | PRN
  Administered 2021-04-24 – 2021-04-29 (×2): 0.5 mg via ORAL
  Filled 2021-04-24 (×2): qty 1

## 2021-04-24 MED ORDER — NITROGLYCERIN IN D5W 200-5 MCG/ML-% IV SOLN
2.0000 ug/min | INTRAVENOUS | Status: DC
  Filled 2021-04-24: qty 250

## 2021-04-24 MED ORDER — SODIUM CHLORIDE 0.9 % IV SOLN: INTRAVENOUS | Status: AC

## 2021-04-24 MED ORDER — INSULIN REGULAR(HUMAN) IN NACL 100-0.9 UT/100ML-% IV SOLN
INTRAVENOUS | Status: AC
  Administered 2021-04-25: 5.5 [IU]/h via INTRAVENOUS
  Filled 2021-04-24 (×3): qty 100

## 2021-04-24 SURGICAL SUPPLY — 11 items
CATH INFINITI JR4 5F (CATHETERS) ×2 IMPLANT
CATH OPTITORQUE TIG 4.0 5F (CATHETERS) ×2 IMPLANT
DEVICE RAD COMP TR BAND LRG (VASCULAR PRODUCTS) ×2 IMPLANT
GLIDESHEATH SLEND SS 6F .021 (SHEATH) ×2 IMPLANT
GUIDEWIRE INQWIRE 1.5J.035X260 (WIRE) ×1 IMPLANT
INQWIRE 1.5J .035X260CM (WIRE) ×2
KIT HEART LEFT (KITS) ×2 IMPLANT
PACK CARDIAC CATHETERIZATION (CUSTOM PROCEDURE TRAY) ×2 IMPLANT
SHEATH PROBE COVER 6X72 (BAG) ×2 IMPLANT
TRANSDUCER W/STOPCOCK (MISCELLANEOUS) ×2 IMPLANT
TUBING CIL FLEX 10 FLL-RA (TUBING) ×2 IMPLANT

## 2021-04-24 NOTE — Progress Notes (Signed)
TCTS consulted for CABG evaluation. °

## 2021-04-24 NOTE — Interval H&P Note (Signed)
Cath Lab Visit (complete for each Cath Lab visit)  Clinical Evaluation Leading to the Procedure:   ACS: No.  Non-ACS:    Anginal Classification: CCS II  Anti-ischemic medical therapy: Minimal Therapy (1 class of medications)  Non-Invasive Test Results: No non-invasive testing performed  Prior CABG: No previous CABG      History and Physical Interval Note:  04/24/2021 10:43 AM  Barry Schmidt  has presented today for surgery, with the diagnosis of chest pain.  The various methods of treatment have been discussed with the patient and family. After consideration of risks, benefits and other options for treatment, the patient has consented to  Procedure(s): LEFT HEART CATH AND CORONARY ANGIOGRAPHY (N/A) as a surgical intervention.  The patient's history has been reviewed, patient examined, no change in status, stable for surgery.  I have reviewed the patient's chart and labs.  Questions were answered to the patient's satisfaction.     Nicki Guadalajara

## 2021-04-24 NOTE — Progress Notes (Signed)
Pre cabg has been completed.   Preliminary results in CV Proc.   Blanch Media 04/24/2021 2:40 PM

## 2021-04-24 NOTE — Progress Notes (Signed)
[id number] Gave pt IS and he was able to demonstrate 2000 ml correctly. Discussed importance of IS and mobility after surgery. Discussed sternal precautions and staying in the tube. Gave handout. Pt stated wife available to assist in care after discharge. Will follow up after surgery. Luetta Nutting RN BSN 04/24/2021 2:32 PM

## 2021-04-24 NOTE — Consult Note (Signed)
Reason for Consult:Left main and 3 vessel CAD Referring Physician: Dr. Olevia Bowens Barry Schmidt is an 60 y.o. male.  HPI: 60 yo man with cc/o exertional chest pressure  Barry Schmidt is a 60 yo man with a positive family history of CAD. He has no prior history of CAD.  He has a history of type II diabetes, dyslipidemia and remote low volume tobacco use. He has about a 3 month history of chest pressure with exertion relieved with rest. No recent rest or nocturnal pain, but did have an episode of nocturnal pain about 18 months ago which led him to quit smoking.  He saw Dr. Tresa Endo who recommended cardiac catheterization. 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 EF estimated 35-40% vs 55% by echo.  Past Medical History:  Diagnosis Date   Diabetes mellitus without complication (HCC)    On statin therapy due to risk of future cardiovascular event 10/14/2018   Uncontrolled diabetes mellitus without complication, with long-term current use of insulin 06/23/2018   Diagnosed in 2007; was morbidly obese at that time 260 pounds. He lost weight and did well.     No past surgical history on file.  Family History  Problem Relation Age of Onset   Heart disease Mother        PTCA with stent   Hypertension Mother    Heart attack Father    Diabetes type I Father    Diabetes Brother    Diabetes Sister    Diabetes Brother    Healthy Son    Healthy Son     Social History:  reports that he quit smoking about 17 months ago. His smoking use included cigarettes. He has never used smokeless tobacco. He reports current alcohol use. He reports that he does not use drugs.  Allergies:  Allergies  Allergen Reactions   Penicillins Hives    Reaction: 15-20 years ago    Medications: Scheduled:  aspirin  81 mg Oral Daily   insulin aspart  0-15 Units Subcutaneous TID WC   isosorbide mononitrate  30 mg Oral Daily   metoprolol  tartrate  25 mg Oral BID   rosuvastatin  40 mg Oral Daily   sodium chloride flush  3 mL Intravenous Q12H   sodium chloride flush  3 mL Intravenous Q12H    Results for orders placed or performed during the hospital encounter of 04/24/21 (from the past 48 hour(s))  Glucose, capillary     Status: Abnormal   Collection Time: 04/24/21  7:56 AM  Result Value Ref Range   Glucose-Capillary 121 (H) 70 - 99 mg/dL    Comment: Glucose reference range applies only to samples taken after fasting for at least 8 hours.   Comment 1 Notify RN   Glucose, capillary     Status: Abnormal   Collection Time: 04/24/21 11:50 AM  Result Value Ref Range   Glucose-Capillary 118 (H) 70 - 99 mg/dL    Comment: Glucose reference range applies only to samples taken after fasting for at least 8 hours.    CARDIAC CATHETERIZATION  Result Date: 04/24/2021 Formatting of this result is different from the original.   Prox RCA lesion is 70% stenosed.   RPAV lesion is 50% stenosed.   Dist RCA lesion is 50% stenosed.   Ost LAD to Prox LAD lesion is 100% stenosed.   Mid LM to Dist LM lesion is 85% stenosed.  Ramus lesion is 80% stenosed.   Ost Cx lesion is 70% stenosed.   Mid Cx to Dist Cx lesion is 50% stenosed.   There is moderate left ventricular systolic dysfunction.   LV end diastolic pressure is normal. Severe multivessel CAD with 85% on distal left main stenosis; total ostial occlusion of a large LAD; 80% ostial ramus intermediate stenosis and 70% ostial circumflex stenosis.  There is 50% narrowing in the mid left circumflex after marginal branch.  There is significant dampening with each injection into the RCA.  There is 70% proximal RCA stenoses with 50% irregularity distally.  There is extensive collateralization from the distal RCA via septal collaterals supplying the LAD. RECOMMENDATION: Patient will be admitted with plans for surgical consultation for CABG revascularization surgery due to high-grade left main stenosis.     I personally reviewed the cath images and concur with the findings noted above  Review of Systems  Constitutional:  Positive for fatigue.  HENT:  Negative for trouble swallowing and voice change.   Eyes:  Negative for visual disturbance.  Respiratory:  Positive for shortness of breath.   Cardiovascular:  Positive for chest pain. Negative for palpitations and leg swelling.  Gastrointestinal:  Negative for abdominal distention and abdominal pain.  Genitourinary:  Negative for difficulty urinating.  Musculoskeletal:  Negative for arthralgias.  Neurological:  Positive for dizziness. Negative for syncope and weakness.  Hematological:  Negative for adenopathy. Does not bruise/bleed easily.  Blood pressure 135/68, pulse (!) 54, temperature 97.6 F (36.4 C), temperature source Oral, resp. rate 15, height 5\' 8"  (1.727 m), weight 97.3 kg, SpO2 97 %. Physical Exam Vitals reviewed.  Constitutional:      General: He is not in acute distress.    Appearance: Normal appearance.  HENT:     Head: Normocephalic and atraumatic.  Eyes:     General: No scleral icterus.    Extraocular Movements: Extraocular movements intact.     Pupils: Pupils are equal, round, and reactive to light.  Cardiovascular:     Rate and Rhythm: Normal rate and regular rhythm.     Pulses: Normal pulses.     Heart sounds: Normal heart sounds. No murmur heard. Pulmonary:     Effort: No respiratory distress.     Breath sounds: Normal breath sounds. No wheezing.  Abdominal:     General: There is no distension.     Palpations: Abdomen is soft.     Tenderness: There is no abdominal tenderness.  Musculoskeletal:     Right lower leg: No edema.     Left lower leg: No edema.  Skin:    General: Skin is warm and dry.  Neurological:     General: No focal deficit present.     Mental Status: He is alert and oriented to person, place, and time.     Cranial Nerves: No cranial nerve deficit.     Motor: No weakness.  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 Symptoms classic for angina. 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 EF 35-40% with anterior hypokinesis.  CABG indicated for survival benefit and relief of symptoms.  I discussed the general nature of the procedure, including the need for general anesthesia, the incisions to be used, the use of cardiopulmonary bypass, and the use of drainage tubes postoperatively. We discussed the expected  hospital stay, overall recovery and short and long term outcomes. I informed Mr and Mrs Kines of the indications, risks, benefits and alternatives.  They understand the risks include, but are not limited to death, stroke, MI, DVT/PE, bleeding, possible need for transfusion, infections, cardiac arrhythmias, as well as other organ system dysfunction including respiratory, renal, or GI complications. We discussed use of the left radial artery in addition to LIMA and saphenous vein.  He accepts the risks and agrees to proceed.   Plan CABG with left radial harvest in AM 7/21  Loreli Slot 04/24/2021, 1:25 PM

## 2021-04-24 NOTE — Research (Signed)
IDENTIFY Informed Consent   Subject Name: Barry Schmidt  Subject met inclusion and exclusion criteria.  The informed consent form, study requirements and expectations were reviewed with the subject and questions and concerns were addressed prior to the signing of the consent form.  The subject verbalized understanding of the trail requirements.  The subject agreed to participate in the IDENTIFY trial and signed the informed consent.  The informed consent was obtained prior to performance of any protocol-specific procedures for the subject.  A copy of the signed informed consent was given to the subject and a copy was placed in the subject's medical record.  Mena Goes. 04/24/2021, 08:40 am

## 2021-04-24 NOTE — H&P (View-Only) (Signed)
Reason for Consult:Left main and 3 vessel CAD Referring Physician: Dr. Olevia Bowens Linden is an 60 y.o. male.  HPI: 60 yo man with cc/o exertional chest pressure  Mr. Poole is a 60 yo man with a positive family history of CAD. He has no prior history of CAD.  He has a history of type II diabetes, dyslipidemia and remote low volume tobacco use. He has about a 3 month history of chest pressure with exertion relieved with rest. No recent rest or nocturnal pain, but did have an episode of nocturnal pain about 18 months ago which led him to quit smoking.  He saw Dr. Tresa Endo who recommended cardiac catheterization. 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 EF estimated 35-40% vs 55% by echo.  Past Medical History:  Diagnosis Date   Diabetes mellitus without complication (HCC)    On statin therapy due to risk of future cardiovascular event 10/14/2018   Uncontrolled diabetes mellitus without complication, with long-term current use of insulin 06/23/2018   Diagnosed in 2007; was morbidly obese at that time 260 pounds. He lost weight and did well.     No past surgical history on file.  Family History  Problem Relation Age of Onset   Heart disease Mother        PTCA with stent   Hypertension Mother    Heart attack Father    Diabetes type I Father    Diabetes Brother    Diabetes Sister    Diabetes Brother    Healthy Son    Healthy Son     Social History:  reports that he quit smoking about 17 months ago. His smoking use included cigarettes. He has never used smokeless tobacco. He reports current alcohol use. He reports that he does not use drugs.  Allergies:  Allergies  Allergen Reactions   Penicillins Hives    Reaction: 15-20 years ago    Medications: Scheduled:  aspirin  81 mg Oral Daily   insulin aspart  0-15 Units Subcutaneous TID WC   isosorbide mononitrate  30 mg Oral Daily   metoprolol  tartrate  25 mg Oral BID   rosuvastatin  40 mg Oral Daily   sodium chloride flush  3 mL Intravenous Q12H   sodium chloride flush  3 mL Intravenous Q12H    Results for orders placed or performed during the hospital encounter of 04/24/21 (from the past 48 hour(s))  Glucose, capillary     Status: Abnormal   Collection Time: 04/24/21  7:56 AM  Result Value Ref Range   Glucose-Capillary 121 (H) 70 - 99 mg/dL    Comment: Glucose reference range applies only to samples taken after fasting for at least 8 hours.   Comment 1 Notify RN   Glucose, capillary     Status: Abnormal   Collection Time: 04/24/21 11:50 AM  Result Value Ref Range   Glucose-Capillary 118 (H) 70 - 99 mg/dL    Comment: Glucose reference range applies only to samples taken after fasting for at least 8 hours.    CARDIAC CATHETERIZATION  Result Date: 04/24/2021 Formatting of this result is different from the original.   Prox RCA lesion is 70% stenosed.   RPAV lesion is 50% stenosed.   Dist RCA lesion is 50% stenosed.   Ost LAD to Prox LAD lesion is 100% stenosed.   Mid LM to Dist LM lesion is 85% stenosed.  Ramus lesion is 80% stenosed.   Ost Cx lesion is 70% stenosed.   Mid Cx to Dist Cx lesion is 50% stenosed.   There is moderate left ventricular systolic dysfunction.   LV end diastolic pressure is normal. Severe multivessel CAD with 85% on distal left main stenosis; total ostial occlusion of a large LAD; 80% ostial ramus intermediate stenosis and 70% ostial circumflex stenosis.  There is 50% narrowing in the mid left circumflex after marginal branch.  There is significant dampening with each injection into the RCA.  There is 70% proximal RCA stenoses with 50% irregularity distally.  There is extensive collateralization from the distal RCA via septal collaterals supplying the LAD. RECOMMENDATION: Patient will be admitted with plans for surgical consultation for CABG revascularization surgery due to high-grade left main stenosis.     I personally reviewed the cath images and concur with the findings noted above  Review of Systems  Constitutional:  Positive for fatigue.  HENT:  Negative for trouble swallowing and voice change.   Eyes:  Negative for visual disturbance.  Respiratory:  Positive for shortness of breath.   Cardiovascular:  Positive for chest pain. Negative for palpitations and leg swelling.  Gastrointestinal:  Negative for abdominal distention and abdominal pain.  Genitourinary:  Negative for difficulty urinating.  Musculoskeletal:  Negative for arthralgias.  Neurological:  Positive for dizziness. Negative for syncope and weakness.  Hematological:  Negative for adenopathy. Does not bruise/bleed easily.  Blood pressure 135/68, pulse (!) 54, temperature 97.6 F (36.4 C), temperature source Oral, resp. rate 15, height 5\' 8"  (1.727 m), weight 97.3 kg, SpO2 97 %. Physical Exam Vitals reviewed.  Constitutional:      General: He is not in acute distress.    Appearance: Normal appearance.  HENT:     Head: Normocephalic and atraumatic.  Eyes:     General: No scleral icterus.    Extraocular Movements: Extraocular movements intact.     Pupils: Pupils are equal, round, and reactive to light.  Cardiovascular:     Rate and Rhythm: Normal rate and regular rhythm.     Pulses: Normal pulses.     Heart sounds: Normal heart sounds. No murmur heard. Pulmonary:     Effort: No respiratory distress.     Breath sounds: Normal breath sounds. No wheezing.  Abdominal:     General: There is no distension.     Palpations: Abdomen is soft.     Tenderness: There is no abdominal tenderness.  Musculoskeletal:     Right lower leg: No edema.     Left lower leg: No edema.  Skin:    General: Skin is warm and dry.  Neurological:     General: No focal deficit present.     Mental Status: He is alert and oriented to person, place, and time.     Cranial Nerves: No cranial nerve deficit.     Motor: No weakness.  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 Symptoms classic for angina. 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 EF 35-40% with anterior hypokinesis.  CABG indicated for survival benefit and relief of symptoms.  I discussed the general nature of the procedure, including the need for general anesthesia, the incisions to be used, the use of cardiopulmonary bypass, and the use of drainage tubes postoperatively. We discussed the expected  hospital stay, overall recovery and short and long term outcomes. I informed Mr and Mrs Kines of the indications, risks, benefits and alternatives.  They understand the risks include, but are not limited to death, stroke, MI, DVT/PE, bleeding, possible need for transfusion, infections, cardiac arrhythmias, as well as other organ system dysfunction including respiratory, renal, or GI complications. We discussed use of the left radial artery in addition to LIMA and saphenous vein.  He accepts the risks and agrees to proceed.   Plan CABG with left radial harvest in AM 7/21  Loreli Slot 04/24/2021, 1:25 PM

## 2021-04-25 ENCOUNTER — Encounter (HOSPITAL_COMMUNITY): Payer: Self-pay | Admitting: Cardiovascular Disease

## 2021-04-25 ENCOUNTER — Inpatient Hospital Stay (HOSPITAL_COMMUNITY): Payer: BC Managed Care – PPO

## 2021-04-25 ENCOUNTER — Inpatient Hospital Stay (HOSPITAL_COMMUNITY): Payer: BC Managed Care – PPO | Admitting: Certified Registered Nurse Anesthetist

## 2021-04-25 ENCOUNTER — Inpatient Hospital Stay (HOSPITAL_COMMUNITY)
Admission: RE | Disposition: A | Discharge status: Home / Self Care | Payer: Self-pay | Source: Home / Self Care | Attending: Thoracic Surgery (Cardiothoracic Vascular Surgery)

## 2021-04-25 DIAGNOSIS — I25110 Atherosclerotic heart disease of native coronary artery with unstable angina pectoris: Secondary | ICD-10-CM

## 2021-04-25 DIAGNOSIS — I208 Other forms of angina pectoris: Secondary | ICD-10-CM

## 2021-04-25 HISTORY — PX: CORONARY ARTERY BYPASS GRAFT: SHX141

## 2021-04-25 HISTORY — PX: ENDOVEIN HARVEST OF GREATER SAPHENOUS VEIN: SHX5059

## 2021-04-25 HISTORY — PX: TEE WITHOUT CARDIOVERSION: SHX5443

## 2021-04-25 HISTORY — PX: RADIAL ARTERY HARVEST: SHX5067

## 2021-04-25 LAB — APTT
aPTT: 26 seconds (ref 24–36)
aPTT: 31 seconds (ref 24–36)

## 2021-04-25 LAB — POCT I-STAT 7, (LYTES, BLD GAS, ICA,H+H)
Acid-Base Excess: 1 mmol/L (ref 0.0–2.0)
Acid-Base Excess: 2 mmol/L (ref 0.0–2.0)
Acid-base deficit: 1 mmol/L (ref 0.0–2.0)
Acid-base deficit: 7 mmol/L — ABNORMAL HIGH (ref 0.0–2.0)
Bicarbonate: 26.3 mmol/L (ref 20.0–28.0)
Bicarbonate: 25.5 mmol/L (ref 20.0–28.0)
Bicarbonate: 24.6 mmol/L (ref 20.0–28.0)
Bicarbonate: 19.8 mmol/L — ABNORMAL LOW (ref 20.0–28.0)
Calcium, Ion: 1 mmol/L — ABNORMAL LOW (ref 1.15–1.40)
Calcium, Ion: 1.05 mmol/L — ABNORMAL LOW (ref 1.15–1.40)
Calcium, Ion: 1.19 mmol/L (ref 1.15–1.40)
Calcium, Ion: 1.04 mmol/L — ABNORMAL LOW (ref 1.15–1.40)
HCT: 28 % — ABNORMAL LOW (ref 39.0–52.0)
HCT: 30 % — ABNORMAL LOW (ref 39.0–52.0)
HCT: 27 % — ABNORMAL LOW (ref 39.0–52.0)
HCT: 28 % — ABNORMAL LOW (ref 39.0–52.0)
Hemoglobin: 9.5 g/dL — ABNORMAL LOW (ref 13.0–17.0)
Hemoglobin: 10.2 g/dL — ABNORMAL LOW (ref 13.0–17.0)
Hemoglobin: 9.2 g/dL — ABNORMAL LOW (ref 13.0–17.0)
Hemoglobin: 9.5 g/dL — ABNORMAL LOW (ref 13.0–17.0)
O2 Saturation: 100 %
O2 Saturation: 100 %
O2 Saturation: 99 %
O2 Saturation: 96 %
Patient temperature: 36.3
Potassium: 4.2 mmol/L (ref 3.5–5.1)
Potassium: 4.6 mmol/L (ref 3.5–5.1)
Potassium: 4.4 mmol/L (ref 3.5–5.1)
Potassium: 3.4 mmol/L — ABNORMAL LOW (ref 3.5–5.1)
Sodium: 138 mmol/L (ref 135–145)
Sodium: 138 mmol/L (ref 135–145)
Sodium: 136 mmol/L (ref 135–145)
Sodium: 143 mmol/L (ref 135–145)
TCO2: 28 mmol/L (ref 22–32)
TCO2: 27 mmol/L (ref 22–32)
TCO2: 26 mmol/L (ref 22–32)
TCO2: 21 mmol/L — ABNORMAL LOW (ref 22–32)
pCO2 arterial: 43.3 mmHg (ref 32.0–48.0)
pCO2 arterial: 36.6 mmHg (ref 32.0–48.0)
pCO2 arterial: 45.1 mmHg (ref 32.0–48.0)
pCO2 arterial: 42.6 mmHg (ref 32.0–48.0)
pH, Arterial: 7.393 (ref 7.350–7.450)
pH, Arterial: 7.451 — ABNORMAL HIGH (ref 7.350–7.450)
pH, Arterial: 7.345 — ABNORMAL LOW (ref 7.350–7.450)
pH, Arterial: 7.272 — ABNORMAL LOW (ref 7.350–7.450)
pO2, Arterial: 352 mmHg — ABNORMAL HIGH (ref 83.0–108.0)
pO2, Arterial: 261 mmHg — ABNORMAL HIGH (ref 83.0–108.0)
pO2, Arterial: 143 mmHg — ABNORMAL HIGH (ref 83.0–108.0)
pO2, Arterial: 92 mmHg (ref 83.0–108.0)

## 2021-04-25 LAB — POCT I-STAT, CHEM 8
BUN: 12 mg/dL (ref 6–20)
BUN: 14 mg/dL (ref 6–20)
BUN: 14 mg/dL (ref 6–20)
BUN: 12 mg/dL (ref 6–20)
Calcium, Ion: 1.05 mmol/L — ABNORMAL LOW (ref 1.15–1.40)
Calcium, Ion: 1.21 mmol/L (ref 1.15–1.40)
Calcium, Ion: 1.05 mmol/L — ABNORMAL LOW (ref 1.15–1.40)
Calcium, Ion: 1.18 mmol/L (ref 1.15–1.40)
Chloride: 108 mmol/L (ref 98–111)
Chloride: 103 mmol/L (ref 98–111)
Chloride: 101 mmol/L (ref 98–111)
Chloride: 102 mmol/L (ref 98–111)
Creatinine, Ser: 0.5 mg/dL — ABNORMAL LOW (ref 0.61–1.24)
Creatinine, Ser: 0.6 mg/dL — ABNORMAL LOW (ref 0.61–1.24)
Creatinine, Ser: 0.5 mg/dL — ABNORMAL LOW (ref 0.61–1.24)
Creatinine, Ser: 0.6 mg/dL — ABNORMAL LOW (ref 0.61–1.24)
Glucose, Bld: 166 mg/dL — ABNORMAL HIGH (ref 70–99)
Glucose, Bld: 131 mg/dL — ABNORMAL HIGH (ref 70–99)
Glucose, Bld: 139 mg/dL — ABNORMAL HIGH (ref 70–99)
Glucose, Bld: 176 mg/dL — ABNORMAL HIGH (ref 70–99)
HCT: 30 % — ABNORMAL LOW (ref 39.0–52.0)
HCT: 35 % — ABNORMAL LOW (ref 39.0–52.0)
HCT: 26 % — ABNORMAL LOW (ref 39.0–52.0)
HCT: 27 % — ABNORMAL LOW (ref 39.0–52.0)
Hemoglobin: 10.2 g/dL — ABNORMAL LOW (ref 13.0–17.0)
Hemoglobin: 11.9 g/dL — ABNORMAL LOW (ref 13.0–17.0)
Hemoglobin: 8.8 g/dL — ABNORMAL LOW (ref 13.0–17.0)
Hemoglobin: 9.2 g/dL — ABNORMAL LOW (ref 13.0–17.0)
Potassium: 3.2 mmol/L — ABNORMAL LOW (ref 3.5–5.1)
Potassium: 3.9 mmol/L (ref 3.5–5.1)
Potassium: 5 mmol/L (ref 3.5–5.1)
Potassium: 4.4 mmol/L (ref 3.5–5.1)
Sodium: 140 mmol/L (ref 135–145)
Sodium: 139 mmol/L (ref 135–145)
Sodium: 133 mmol/L — ABNORMAL LOW (ref 135–145)
Sodium: 135 mmol/L (ref 135–145)
TCO2: 21 mmol/L — ABNORMAL LOW (ref 22–32)
TCO2: 25 mmol/L (ref 22–32)
TCO2: 24 mmol/L (ref 22–32)
TCO2: 24 mmol/L (ref 22–32)

## 2021-04-25 LAB — PHOSPHORUS: Phosphorus: 3 mg/dL (ref 2.5–4.6)

## 2021-04-25 LAB — BASIC METABOLIC PANEL
Anion gap: 5 (ref 5–15)
BUN: 17 mg/dL (ref 6–20)
CO2: 28 mmol/L (ref 22–32)
Calcium: 8.9 mg/dL (ref 8.9–10.3)
Chloride: 105 mmol/L (ref 98–111)
Creatinine, Ser: 0.97 mg/dL (ref 0.61–1.24)
GFR, Estimated: >60 mL/min (ref >60)
Glucose, Bld: 134 mg/dL — ABNORMAL HIGH (ref 70–99)
Potassium: 4 mmol/L (ref 3.5–5.1)
Sodium: 138 mmol/L (ref 135–145)

## 2021-04-25 LAB — ECHO INTRAOPERATIVE TEE
AV Mean grad: 3 mmHg
AV Peak grad: 6.4 mmHg
Ao pk vel: 1.26 m/s
Height: 68 in
S' Lateral: 3.2 cm
Weight: 3432 oz

## 2021-04-25 LAB — CBC
HCT: 38.9 % — ABNORMAL LOW (ref 39.0–52.0)
HCT: 27.8 % — ABNORMAL LOW (ref 39.0–52.0)
HCT: 32.2 % — ABNORMAL LOW (ref 39.0–52.0)
Hemoglobin: 13 g/dL (ref 13.0–17.0)
Hemoglobin: 9.4 g/dL — ABNORMAL LOW (ref 13.0–17.0)
Hemoglobin: 11 g/dL — ABNORMAL LOW (ref 13.0–17.0)
MCH: 28.8 pg (ref 26.0–34.0)
MCH: 29.4 pg (ref 26.0–34.0)
MCH: 29 pg (ref 26.0–34.0)
MCHC: 33.4 g/dL (ref 30.0–36.0)
MCHC: 33.8 g/dL (ref 30.0–36.0)
MCHC: 34.2 g/dL (ref 30.0–36.0)
MCV: 86.1 fL (ref 80.0–100.0)
MCV: 86.9 fL (ref 80.0–100.0)
MCV: 85 fL (ref 80.0–100.0)
Platelets: 194 10*3/uL (ref 150–400)
Platelets: 113 10*3/uL — ABNORMAL LOW (ref 150–400)
Platelets: 142 10*3/uL — ABNORMAL LOW (ref 150–400)
RBC: 4.52 MIL/uL (ref 4.22–5.81)
RBC: 3.2 MIL/uL — ABNORMAL LOW (ref 4.22–5.81)
RBC: 3.79 MIL/uL — ABNORMAL LOW (ref 4.22–5.81)
RDW: 13.2 % (ref 11.5–15.5)
RDW: 12.9 % (ref 11.5–15.5)
RDW: 13 % (ref 11.5–15.5)
WBC: 6.5 10*3/uL (ref 4.0–10.5)
WBC: 9.8 10*3/uL (ref 4.0–10.5)
WBC: 10.8 10*3/uL — ABNORMAL HIGH (ref 4.0–10.5)
nRBC: 0 % (ref 0.0–0.2)
nRBC: 0 % (ref 0.0–0.2)
nRBC: 0 % (ref 0.0–0.2)

## 2021-04-25 LAB — COMPREHENSIVE METABOLIC PANEL
ALT: 19 U/L (ref 0–44)
AST: 30 U/L (ref 15–41)
Albumin: 3.3 g/dL — ABNORMAL LOW (ref 3.5–5.0)
Alkaline Phosphatase: 41 U/L (ref 38–126)
Anion gap: 7 (ref 5–15)
BUN: 13 mg/dL (ref 6–20)
CO2: 23 mmol/L (ref 22–32)
Calcium: 7.5 mg/dL — ABNORMAL LOW (ref 8.9–10.3)
Chloride: 105 mmol/L (ref 98–111)
Creatinine, Ser: 0.77 mg/dL (ref 0.61–1.24)
GFR, Estimated: >60 mL/min (ref >60)
Glucose, Bld: 143 mg/dL — ABNORMAL HIGH (ref 70–99)
Potassium: 3.9 mmol/L (ref 3.5–5.1)
Sodium: 135 mmol/L (ref 135–145)
Total Bilirubin: 0.8 mg/dL (ref 0.3–1.2)
Total Protein: 5.2 g/dL — ABNORMAL LOW (ref 6.5–8.1)

## 2021-04-25 LAB — HEMOGLOBIN AND HEMATOCRIT, BLOOD
HCT: 29.9 % — ABNORMAL LOW (ref 39.0–52.0)
Hemoglobin: 10.2 g/dL — ABNORMAL LOW (ref 13.0–17.0)

## 2021-04-25 LAB — HEMOGLOBIN A1C
Hgb A1c MFr Bld: 7.4 % — ABNORMAL HIGH (ref 4.8–5.6)
Mean Plasma Glucose: 165.68 mg/dL

## 2021-04-25 LAB — POCT I-STAT EG7
Acid-Base Excess: 0 mmol/L (ref 0.0–2.0)
Bicarbonate: 26.2 mmol/L (ref 20.0–28.0)
Calcium, Ion: 1.02 mmol/L — ABNORMAL LOW (ref 1.15–1.40)
HCT: 28 % — ABNORMAL LOW (ref 39.0–52.0)
Hemoglobin: 9.5 g/dL — ABNORMAL LOW (ref 13.0–17.0)
O2 Saturation: 89 %
Potassium: 4 mmol/L (ref 3.5–5.1)
Sodium: 139 mmol/L (ref 135–145)
TCO2: 28 mmol/L (ref 22–32)
pCO2, Ven: 48.2 mmHg (ref 44.0–60.0)
pH, Ven: 7.343 (ref 7.250–7.430)
pO2, Ven: 61 mmHg — ABNORMAL HIGH (ref 32.0–45.0)

## 2021-04-25 LAB — SURGICAL PCR SCREEN
MRSA, PCR: NEGATIVE
Staphylococcus aureus: POSITIVE — AB

## 2021-04-25 LAB — PROTIME-INR
INR: 1 (ref 0.8–1.2)
INR: 1.5 — ABNORMAL HIGH (ref 0.8–1.2)
Prothrombin Time: 13.4 seconds (ref 11.4–15.2)
Prothrombin Time: 18.1 seconds — ABNORMAL HIGH (ref 11.4–15.2)

## 2021-04-25 LAB — GLUCOSE, CAPILLARY
Glucose-Capillary: 107 mg/dL — ABNORMAL HIGH (ref 70–99)
Glucose-Capillary: 127 mg/dL — ABNORMAL HIGH (ref 70–99)
Glucose-Capillary: 111 mg/dL — ABNORMAL HIGH (ref 70–99)
Glucose-Capillary: 113 mg/dL — ABNORMAL HIGH (ref 70–99)
Glucose-Capillary: 127 mg/dL — ABNORMAL HIGH (ref 70–99)
Glucose-Capillary: 127 mg/dL — ABNORMAL HIGH (ref 70–99)
Glucose-Capillary: 117 mg/dL — ABNORMAL HIGH (ref 70–99)
Glucose-Capillary: 141 mg/dL — ABNORMAL HIGH (ref 70–99)
Glucose-Capillary: 147 mg/dL — ABNORMAL HIGH (ref 70–99)
Glucose-Capillary: 117 mg/dL — ABNORMAL HIGH (ref 70–99)

## 2021-04-25 LAB — SARS CORONAVIRUS 2 BY RT PCR (HOSPITAL ORDER, PERFORMED IN ~~LOC~~ HOSPITAL LAB): SARS Coronavirus 2: NEGATIVE

## 2021-04-25 LAB — PLATELET COUNT: Platelets: 156 10*3/uL (ref 150–400)

## 2021-04-25 LAB — MAGNESIUM: Magnesium: 2.5 mg/dL — ABNORMAL HIGH (ref 1.7–2.4)

## 2021-04-25 SURGERY — CORONARY ARTERY BYPASS GRAFTING (CABG)
Anesthesia: General | Site: Chest

## 2021-04-25 MED ORDER — 0.9 % SODIUM CHLORIDE (POUR BTL) OPTIME
TOPICAL | Status: DC | PRN
  Administered 2021-04-25: 5000 mL

## 2021-04-25 MED ORDER — ORAL CARE MOUTH RINSE
15.0000 mL | OROMUCOSAL | Status: DC
  Administered 2021-04-25 – 2021-04-26 (×8): 15 mL via OROMUCOSAL

## 2021-04-25 MED ORDER — MIDAZOLAM HCL 2 MG/2ML IJ SOLN
2.0000 mg | INTRAMUSCULAR | Status: DC | PRN
  Administered 2021-04-25 (×2): 2 mg via INTRAVENOUS
  Filled 2021-04-25 (×2): qty 2

## 2021-04-25 MED ORDER — VASOPRESSIN 20 UNIT/ML IV SOLN
INTRAVENOUS | Status: AC
  Filled 2021-04-25: qty 1

## 2021-04-25 MED ORDER — SODIUM CHLORIDE (PF) 0.9 % IJ SOLN
OROMUCOSAL | Status: DC | PRN
  Administered 2021-04-25 (×3): 4 mL via TOPICAL

## 2021-04-25 MED ORDER — ROCURONIUM BROMIDE 10 MG/ML (PF) SYRINGE
PREFILLED_SYRINGE | INTRAVENOUS | Status: AC
  Filled 2021-04-25: qty 20

## 2021-04-25 MED ORDER — VANCOMYCIN HCL IN DEXTROSE 1-5 GM/200ML-% IV SOLN
1000.0000 mg | Freq: Once | INTRAVENOUS | Status: AC
  Administered 2021-04-25: 1000 mg via INTRAVENOUS
  Filled 2021-04-25: qty 200

## 2021-04-25 MED ORDER — HEPARIN SODIUM (PORCINE) 1000 UNIT/ML IJ SOLN
INTRAMUSCULAR | Status: AC
  Filled 2021-04-25: qty 1

## 2021-04-25 MED ORDER — MIDAZOLAM HCL (PF) 10 MG/2ML IJ SOLN
INTRAMUSCULAR | Status: AC
  Filled 2021-04-25: qty 2

## 2021-04-25 MED ORDER — HEMOSTATIC AGENTS (NO CHARGE) OPTIME
TOPICAL | Status: DC | PRN
  Administered 2021-04-25: 1 via TOPICAL

## 2021-04-25 MED ORDER — MAGNESIUM SULFATE 4 GM/100ML IV SOLN
4.0000 g | Freq: Once | INTRAVENOUS | Status: AC
  Administered 2021-04-25: 4 g via INTRAVENOUS
  Filled 2021-04-25: qty 100

## 2021-04-25 MED ORDER — SODIUM CHLORIDE 0.9 % IV SOLN: INTRAVENOUS | Status: DC

## 2021-04-25 MED ORDER — CHLORHEXIDINE GLUCONATE CLOTH 2 % EX PADS
6.0000 | MEDICATED_PAD | Freq: Every day | CUTANEOUS | Status: DC
  Administered 2021-04-25 – 2021-04-26 (×2): 6 via TOPICAL

## 2021-04-25 MED ORDER — ACETAMINOPHEN 160 MG/5ML PO SOLN: 1000.0000 mg | Freq: Four times a day (QID) | ORAL | Status: DC

## 2021-04-25 MED ORDER — CHLORHEXIDINE GLUCONATE CLOTH 2 % EX PADS
6.0000 | MEDICATED_PAD | Freq: Every day | CUTANEOUS | Status: DC
  Administered 2021-04-27: 6 via TOPICAL

## 2021-04-25 MED ORDER — BISACODYL 10 MG RE SUPP: 10.0000 mg | Freq: Every day | RECTAL | Status: DC

## 2021-04-25 MED ORDER — SODIUM CHLORIDE 0.9 % IV SOLN: INTRAVENOUS | Status: DC | PRN

## 2021-04-25 MED ORDER — METOPROLOL TARTRATE 25 MG/10 ML ORAL SUSPENSION
12.5000 mg | Freq: Two times a day (BID) | ORAL | Status: DC
  Filled 2021-04-25 (×2): qty 5

## 2021-04-25 MED ORDER — DEXTROSE 50 % IV SOLN: 0.0000 mL | INTRAVENOUS | Status: DC | PRN

## 2021-04-25 MED ORDER — NITROGLYCERIN IN D5W 200-5 MCG/ML-% IV SOLN
0.0000 ug/min | INTRAVENOUS | Status: DC
  Administered 2021-04-25: 5 ug/min via INTRAVENOUS

## 2021-04-25 MED ORDER — PROPOFOL 10 MG/ML IV BOLUS
INTRAVENOUS | Status: AC
  Filled 2021-04-25: qty 20

## 2021-04-25 MED ORDER — TRAMADOL HCL 50 MG PO TABS
50.0000 mg | ORAL_TABLET | ORAL | Status: DC | PRN
  Administered 2021-04-26 (×2): 100 mg via ORAL
  Filled 2021-04-25 (×2): qty 2

## 2021-04-25 MED ORDER — PANTOPRAZOLE SODIUM 40 MG PO TBEC
40.0000 mg | DELAYED_RELEASE_TABLET | Freq: Every day | ORAL | Status: DC
  Administered 2021-04-27: 40 mg via ORAL
  Filled 2021-04-25: qty 1

## 2021-04-25 MED ORDER — ONDANSETRON HCL 4 MG/2ML IJ SOLN
INTRAMUSCULAR | Status: AC
  Filled 2021-04-25: qty 2

## 2021-04-25 MED ORDER — SODIUM CHLORIDE 0.9% FLUSH: 3.0000 mL | INTRAVENOUS | Status: DC | PRN

## 2021-04-25 MED ORDER — SODIUM CHLORIDE 0.9% FLUSH: 10.0000 mL | INTRAVENOUS | Status: DC | PRN

## 2021-04-25 MED ORDER — ONDANSETRON HCL 4 MG/2ML IJ SOLN: 4.0000 mg | Freq: Four times a day (QID) | INTRAMUSCULAR | Status: DC | PRN

## 2021-04-25 MED ORDER — ONDANSETRON HCL 4 MG/2ML IJ SOLN
INTRAMUSCULAR | Status: DC | PRN
  Administered 2021-04-25: 4 mg via INTRAVENOUS

## 2021-04-25 MED ORDER — ALBUMIN HUMAN 5 % IV SOLN
250.0000 mL | INTRAVENOUS | Status: DC | PRN
Start: 2021-04-25 — End: 2021-04-26
  Administered 2021-04-25: 12.5 g via INTRAVENOUS

## 2021-04-25 MED ORDER — LEVOFLOXACIN IN D5W 750 MG/150ML IV SOLN
750.0000 mg | INTRAVENOUS | Status: AC
  Administered 2021-04-26: 750 mg via INTRAVENOUS
  Filled 2021-04-25: qty 150

## 2021-04-25 MED ORDER — INSULIN REGULAR(HUMAN) IN NACL 100-0.9 UT/100ML-% IV SOLN: INTRAVENOUS | Status: DC

## 2021-04-25 MED ORDER — POTASSIUM CHLORIDE 10 MEQ/50ML IV SOLN
10.0000 meq | INTRAVENOUS | Status: AC
Start: 2021-04-25 — End: 2021-04-25
  Administered 2021-04-25 (×3): 10 meq via INTRAVENOUS

## 2021-04-25 MED ORDER — PROTAMINE SULFATE 10 MG/ML IV SOLN
INTRAVENOUS | Status: AC
  Filled 2021-04-25: qty 25

## 2021-04-25 MED ORDER — ASPIRIN 81 MG PO CHEW
324.0000 mg | CHEWABLE_TABLET | Freq: Every day | ORAL | Status: DC
Start: 2021-04-26 — End: 2021-04-28

## 2021-04-25 MED ORDER — FAMOTIDINE IN NACL 20-0.9 MG/50ML-% IV SOLN
20.0000 mg | Freq: Two times a day (BID) | INTRAVENOUS | Status: AC
  Administered 2021-04-25 (×2): 20 mg via INTRAVENOUS
  Filled 2021-04-25 (×2): qty 50

## 2021-04-25 MED ORDER — ARTIFICIAL TEARS OPHTHALMIC OINT
TOPICAL_OINTMENT | OPHTHALMIC | Status: AC
  Filled 2021-04-25: qty 3.5

## 2021-04-25 MED ORDER — LACTATED RINGERS IV SOLN: INTRAVENOUS | Status: DC | PRN

## 2021-04-25 MED ORDER — METOPROLOL TARTRATE 5 MG/5ML IV SOLN: 2.5000 mg | INTRAVENOUS | Status: DC | PRN

## 2021-04-25 MED ORDER — MIDAZOLAM HCL (PF) 5 MG/ML IJ SOLN
INTRAMUSCULAR | Status: DC | PRN
  Administered 2021-04-25: 2 mg via INTRAVENOUS
  Administered 2021-04-25 (×2): 1 mg via INTRAVENOUS
  Administered 2021-04-25 (×2): 2 mg via INTRAVENOUS

## 2021-04-25 MED ORDER — OXYCODONE HCL 5 MG PO TABS
5.0000 mg | ORAL_TABLET | ORAL | Status: DC | PRN
  Administered 2021-04-26 – 2021-04-27 (×4): 10 mg via ORAL
  Filled 2021-04-25 (×5): qty 2

## 2021-04-25 MED ORDER — SODIUM CHLORIDE 0.45 % IV SOLN: INTRAVENOUS | Status: DC | PRN

## 2021-04-25 MED ORDER — ARTIFICIAL TEARS OPHTHALMIC OINT
TOPICAL_OINTMENT | OPHTHALMIC | Status: DC | PRN
  Administered 2021-04-25: 1 via OPHTHALMIC

## 2021-04-25 MED ORDER — METOPROLOL TARTRATE 12.5 MG HALF TABLET
12.5000 mg | ORAL_TABLET | Freq: Two times a day (BID) | ORAL | Status: DC
  Administered 2021-04-26 – 2021-04-27 (×4): 12.5 mg via ORAL
  Filled 2021-04-25 (×4): qty 1

## 2021-04-25 MED ORDER — PROPOFOL 10 MG/ML IV BOLUS
INTRAVENOUS | Status: DC | PRN
  Administered 2021-04-25: 120 mg via INTRAVENOUS

## 2021-04-25 MED ORDER — MORPHINE SULFATE (PF) 2 MG/ML IV SOLN
1.0000 mg | INTRAVENOUS | Status: DC | PRN
  Administered 2021-04-25 – 2021-04-26 (×3): 4 mg via INTRAVENOUS
  Filled 2021-04-25 (×3): qty 2

## 2021-04-25 MED ORDER — NITROGLYCERIN IN D5W 200-5 MCG/ML-% IV SOLN: 7.0000 ug/min | INTRAVENOUS | Status: DC

## 2021-04-25 MED ORDER — ACETAMINOPHEN 500 MG PO TABS
1000.0000 mg | ORAL_TABLET | Freq: Four times a day (QID) | ORAL | Status: DC
  Administered 2021-04-25 – 2021-04-28 (×10): 1000 mg via ORAL
  Filled 2021-04-25 (×10): qty 2

## 2021-04-25 MED ORDER — ACETAMINOPHEN 650 MG RE SUPP
650.0000 mg | Freq: Once | RECTAL | Status: AC
  Administered 2021-04-25: 650 mg via RECTAL

## 2021-04-25 MED ORDER — ACETAMINOPHEN 160 MG/5ML PO SOLN: 650.0000 mg | Freq: Once | ORAL | Status: AC

## 2021-04-25 MED ORDER — CHLORHEXIDINE GLUCONATE 0.12 % MT SOLN
15.0000 mL | OROMUCOSAL | Status: AC
  Administered 2021-04-25: 15 mL via OROMUCOSAL
  Filled 2021-04-25: qty 15

## 2021-04-25 MED ORDER — SODIUM CHLORIDE 0.9% FLUSH
3.0000 mL | Freq: Two times a day (BID) | INTRAVENOUS | Status: DC
  Administered 2021-04-26 – 2021-04-27 (×3): 3 mL via INTRAVENOUS

## 2021-04-25 MED ORDER — PHENYLEPHRINE HCL-NACL 20-0.9 MG/250ML-% IV SOLN: 0.0000 ug/min | INTRAVENOUS | Status: DC

## 2021-04-25 MED ORDER — LACTATED RINGERS IV SOLN: 500.0000 mL | Freq: Once | INTRAVENOUS | Status: DC | PRN

## 2021-04-25 MED ORDER — MUPIROCIN 2 % EX OINT
1.0000 "application " | TOPICAL_OINTMENT | Freq: Two times a day (BID) | CUTANEOUS | Status: AC
  Administered 2021-04-25 – 2021-04-30 (×10): 1 via NASAL
  Filled 2021-04-25 (×4): qty 22

## 2021-04-25 MED ORDER — FENTANYL CITRATE (PF) 250 MCG/5ML IJ SOLN
INTRAMUSCULAR | Status: AC
  Filled 2021-04-25: qty 25

## 2021-04-25 MED ORDER — DOCUSATE SODIUM 100 MG PO CAPS
200.0000 mg | ORAL_CAPSULE | Freq: Every day | ORAL | Status: DC
  Administered 2021-04-26 – 2021-04-27 (×2): 200 mg via ORAL
  Filled 2021-04-25 (×2): qty 2

## 2021-04-25 MED ORDER — ROCURONIUM BROMIDE 10 MG/ML (PF) SYRINGE
PREFILLED_SYRINGE | INTRAVENOUS | Status: AC
  Filled 2021-04-25: qty 10

## 2021-04-25 MED ORDER — ROCURONIUM BROMIDE 10 MG/ML (PF) SYRINGE
PREFILLED_SYRINGE | INTRAVENOUS | Status: DC | PRN
  Administered 2021-04-25: 30 mg via INTRAVENOUS
  Administered 2021-04-25: 50 mg via INTRAVENOUS
  Administered 2021-04-25: 30 mg via INTRAVENOUS
  Administered 2021-04-25: 50 mg via INTRAVENOUS
  Administered 2021-04-25: 70 mg via INTRAVENOUS

## 2021-04-25 MED ORDER — SODIUM BICARBONATE 8.4 % IV SOLN
100.0000 meq | Freq: Once | INTRAVENOUS | Status: AC
  Administered 2021-04-25: 100 meq via INTRAVENOUS

## 2021-04-25 MED ORDER — DEXMEDETOMIDINE HCL IN NACL 400 MCG/100ML IV SOLN
0.0000 ug/kg/h | INTRAVENOUS | Status: DC
  Administered 2021-04-25: 1 ug/kg/h via INTRAVENOUS
  Filled 2021-04-25: qty 100

## 2021-04-25 MED ORDER — FENTANYL CITRATE (PF) 250 MCG/5ML IJ SOLN
INTRAMUSCULAR | Status: DC | PRN
  Administered 2021-04-25 (×2): 100 ug via INTRAVENOUS
  Administered 2021-04-25 (×3): 50 ug via INTRAVENOUS
  Administered 2021-04-25: 250 ug via INTRAVENOUS
  Administered 2021-04-25: 100 ug via INTRAVENOUS
  Administered 2021-04-25: 150 ug via INTRAVENOUS
  Administered 2021-04-25: 200 ug via INTRAVENOUS

## 2021-04-25 MED ORDER — CALCIUM CHLORIDE 10 % IV SOLN
INTRAVENOUS | Status: AC
  Filled 2021-04-25: qty 10

## 2021-04-25 MED ORDER — ASPIRIN EC 325 MG PO TBEC
325.0000 mg | DELAYED_RELEASE_TABLET | Freq: Every day | ORAL | Status: DC
  Administered 2021-04-26 – 2021-04-27 (×2): 325 mg via ORAL
  Filled 2021-04-25 (×2): qty 1

## 2021-04-25 MED ORDER — SODIUM CHLORIDE 0.9 % IV SOLN: 250.0000 mL | INTRAVENOUS | Status: DC

## 2021-04-25 MED ORDER — BISACODYL 5 MG PO TBEC
10.0000 mg | DELAYED_RELEASE_TABLET | Freq: Every day | ORAL | Status: DC
  Administered 2021-04-26 – 2021-04-27 (×2): 10 mg via ORAL
  Filled 2021-04-25 (×2): qty 2

## 2021-04-25 MED ORDER — LACTATED RINGERS IV SOLN: INTRAVENOUS | Status: DC

## 2021-04-25 MED ORDER — HEPARIN SODIUM (PORCINE) 1000 UNIT/ML IJ SOLN
INTRAMUSCULAR | Status: DC | PRN
  Administered 2021-04-25: 20000 [IU] via INTRAVENOUS
  Administered 2021-04-25: 2000 [IU] via INTRAVENOUS

## 2021-04-25 MED ORDER — PROTAMINE SULFATE 10 MG/ML IV SOLN
INTRAVENOUS | Status: DC | PRN
  Administered 2021-04-25: 200 mg via INTRAVENOUS

## 2021-04-25 MED ORDER — PLASMA-LYTE A IV SOLN
INTRAVENOUS | Status: DC | PRN
  Administered 2021-04-25: 1000 mL via INTRAVASCULAR

## 2021-04-25 MED ORDER — CALCIUM CHLORIDE 10 % IV SOLN
INTRAVENOUS | Status: DC | PRN
  Administered 2021-04-25: 100 mg via INTRAVENOUS
  Administered 2021-04-25: 250 mg via INTRAVENOUS

## 2021-04-25 SURGICAL SUPPLY — 107 items
ADAPTER CARDIO PERF ANTE/RETRO (ADAPTER) ×1 IMPLANT
APPLIER CLIP 9.375 SM OPEN (CLIP)
BAG COUNTER SPONGE SURGICOUNT (BAG) ×3 IMPLANT
BAG DECANTER FOR FLEXI CONT (MISCELLANEOUS) ×6 IMPLANT
BLADE CLIPPER SURG (BLADE) ×6 IMPLANT
BLADE STERNUM SYSTEM 6 (BLADE) ×6 IMPLANT
BLADE SURG 15 STRL LF DISP TIS (BLADE) IMPLANT
BLADE SURG 15 STRL SS (BLADE) ×1
BNDG ELASTIC 4X5.8 VLCR STR LF (GAUZE/BANDAGES/DRESSINGS) ×8 IMPLANT
BNDG ELASTIC 6X5.8 VLCR STR LF (GAUZE/BANDAGES/DRESSINGS) ×7 IMPLANT
BNDG GAUZE ELAST 4 BULKY (GAUZE/BANDAGES/DRESSINGS) ×8 IMPLANT
CANISTER SUCT 3000ML PPV (MISCELLANEOUS) ×6 IMPLANT
CANNULA EZ GLIDE AORTIC 21FR (CANNULA) ×6 IMPLANT
CANNULA GUNDRY RCSP 15FR (MISCELLANEOUS) ×1 IMPLANT
CATH CPB KIT HENDRICKSON (MISCELLANEOUS) ×6 IMPLANT
CATH ROBINSON RED A/P 18FR (CATHETERS) ×7 IMPLANT
CATH THORACIC 36FR (CATHETERS) ×6 IMPLANT
CATH THORACIC 36FR RT ANG (CATHETERS) ×6 IMPLANT
CLIP APPLIE 9.375 SM OPEN (CLIP) IMPLANT
CLIP FOGARTY SPRING 6M (CLIP) ×1 IMPLANT
CLIP VESOCCLUDE MED 24/CT (CLIP) IMPLANT
CLIP VESOCCLUDE SM WIDE 24/CT (CLIP) ×3 IMPLANT
CONTAINER PROTECT SURGISLUSH (MISCELLANEOUS) ×7 IMPLANT
COVER MAYO STAND STRL (DRAPES) ×1 IMPLANT
CUFF TOURN SGL QUICK 18X4 (TOURNIQUET CUFF) IMPLANT
CUFF TOURN SGL QUICK 24 (TOURNIQUET CUFF)
CUFF TRNQT CYL 24X4X16.5-23 (TOURNIQUET CUFF) IMPLANT
DERMABOND ADVANCED (GAUZE/BANDAGES/DRESSINGS) ×2
DERMABOND ADVANCED .7 DNX12 (GAUZE/BANDAGES/DRESSINGS) IMPLANT
DRAPE CARDIOVASCULAR INCISE (DRAPES) ×1
DRAPE EXTREMITY T 121X128X90 (DISPOSABLE) ×6 IMPLANT
DRAPE HALF SHEET 40X57 (DRAPES) ×1 IMPLANT
DRAPE SRG 135X102X78XABS (DRAPES) ×5 IMPLANT
DRAPE WARM FLUID 44X44 (DRAPES) ×6 IMPLANT
DRSG COVADERM 4X14 (GAUZE/BANDAGES/DRESSINGS) ×6 IMPLANT
ELECT REM PT RETURN 9FT ADLT (ELECTROSURGICAL) ×12
ELECTRODE REM PT RTRN 9FT ADLT (ELECTROSURGICAL) ×10 IMPLANT
FELT TEFLON 1X6 (MISCELLANEOUS) ×11 IMPLANT
GAUZE SPONGE 4X4 12PLY STRL (GAUZE/BANDAGES/DRESSINGS) ×12 IMPLANT
GAUZE SPONGE 4X4 12PLY STRL LF (GAUZE/BANDAGES/DRESSINGS) ×4 IMPLANT
GEL ULTRASOUND 20GR AQUASONIC (MISCELLANEOUS) IMPLANT
GLOVE SS BIOGEL STRL SZ 7.5 (GLOVE) IMPLANT
GLOVE SUPERSENSE BIOGEL SZ 7.5 (GLOVE) ×3
GLOVE SURG ENC MOIS LTX SZ6 (GLOVE) ×1 IMPLANT
GLOVE SURG SIGNA 7.5 PF LTX (GLOVE) ×18 IMPLANT
GLOVE SURG UNDER POLY LF SZ6 (GLOVE) ×5 IMPLANT
GLOVE SURG UNDER POLY LF SZ6.5 (GLOVE) ×2 IMPLANT
GOWN STRL REUS W/ TWL LRG LVL3 (GOWN DISPOSABLE) ×20 IMPLANT
GOWN STRL REUS W/ TWL XL LVL3 (GOWN DISPOSABLE) ×10 IMPLANT
GOWN STRL REUS W/TWL LRG LVL3 (GOWN DISPOSABLE) ×8
GOWN STRL REUS W/TWL XL LVL3 (GOWN DISPOSABLE) ×5
HEMOSTAT POWDER SURGIFOAM 1G (HEMOSTASIS) ×18 IMPLANT
HEMOSTAT SURGICEL 2X14 (HEMOSTASIS) ×6 IMPLANT
INSERT FOGARTY XLG (MISCELLANEOUS) IMPLANT
KIT BASIN OR (CUSTOM PROCEDURE TRAY) ×6 IMPLANT
KIT CATH SUCT 8FR (CATHETERS) ×1 IMPLANT
KIT SUCTION CATH 14FR (SUCTIONS) ×12 IMPLANT
KIT TURNOVER KIT B (KITS) ×6 IMPLANT
KIT VASOVIEW HEMOPRO 2 VH 4000 (KITS) ×6 IMPLANT
MARKER GRAFT CORONARY BYPASS (MISCELLANEOUS) ×18 IMPLANT
NS IRRIG 1000ML POUR BTL (IV SOLUTION) ×30 IMPLANT
PACK E OPEN HEART (SUTURE) ×6 IMPLANT
PACK OPEN HEART (CUSTOM PROCEDURE TRAY) ×6 IMPLANT
PAD ARMBOARD 7.5X6 YLW CONV (MISCELLANEOUS) ×12 IMPLANT
PAD ELECT DEFIB RADIOL ZOLL (MISCELLANEOUS) ×6 IMPLANT
PENCIL BUTTON HOLSTER BLD 10FT (ELECTRODE) ×6 IMPLANT
POSITIONER HEAD DONUT 9IN (MISCELLANEOUS) ×6 IMPLANT
PUNCH AORTIC ROTATE 4.0MM (MISCELLANEOUS) ×1 IMPLANT
PUNCH AORTIC ROTATE 4.5MM 8IN (MISCELLANEOUS) IMPLANT
PUNCH AORTIC ROTATE 5MM 8IN (MISCELLANEOUS) IMPLANT
SET MPS 3-ND DEL (MISCELLANEOUS) ×1 IMPLANT
SHEARS HARMONIC 9CM CVD (BLADE) ×6 IMPLANT
SPONGE LAP 18X18 RF (DISPOSABLE) ×1 IMPLANT
SPONGE T-LAP 4X18 ~~LOC~~+RFID (SPONGE) ×1 IMPLANT
SUPPORT HEART JANKE-BARRON (MISCELLANEOUS) ×6 IMPLANT
SUT BONE WAX W31G (SUTURE) ×7 IMPLANT
SUT MNCRL AB 4-0 PS2 18 (SUTURE) ×1 IMPLANT
SUT PROLENE 3 0 SH DA (SUTURE) ×6 IMPLANT
SUT PROLENE 4 0 RB 1 (SUTURE) ×2
SUT PROLENE 4 0 SH DA (SUTURE) IMPLANT
SUT PROLENE 4-0 RB1 .5 CRCL 36 (SUTURE) IMPLANT
SUT PROLENE 6 0 C 1 30 (SUTURE) ×12 IMPLANT
SUT PROLENE 7 0 BV 1 (SUTURE) ×2 IMPLANT
SUT PROLENE 7 0 BV1 MDA (SUTURE) ×6 IMPLANT
SUT PROLENE 8 0 BV175 6 (SUTURE) ×3 IMPLANT
SUT STEEL 6MS V (SUTURE) ×6 IMPLANT
SUT STEEL STERNAL CCS#1 18IN (SUTURE) IMPLANT
SUT STEEL SZ 6 DBL 3X14 BALL (SUTURE) ×6 IMPLANT
SUT VIC AB 1 CTX 36 (SUTURE) ×3
SUT VIC AB 1 CTX36XBRD ANBCTR (SUTURE) ×10 IMPLANT
SUT VIC AB 2-0 CT1 27 (SUTURE) ×3
SUT VIC AB 2-0 CT1 TAPERPNT 27 (SUTURE) IMPLANT
SUT VIC AB 2-0 CTX 27 (SUTURE) IMPLANT
SUT VIC AB 3-0 SH 27 (SUTURE)
SUT VIC AB 3-0 SH 27X BRD (SUTURE) IMPLANT
SUT VIC AB 3-0 X1 27 (SUTURE) ×2 IMPLANT
SUT VICRYL 4-0 PS2 18IN ABS (SUTURE) IMPLANT
SYR 50ML SLIP (SYRINGE) IMPLANT
SYSTEM SAHARA CHEST DRAIN ATS (WOUND CARE) ×6 IMPLANT
TAPE CLOTH SURG 4X10 WHT LF (GAUZE/BANDAGES/DRESSINGS) ×1 IMPLANT
TAPE PAPER 2X10 WHT MICROPORE (GAUZE/BANDAGES/DRESSINGS) ×1 IMPLANT
TOWEL GREEN STERILE (TOWEL DISPOSABLE) ×6 IMPLANT
TOWEL GREEN STERILE FF (TOWEL DISPOSABLE) ×6 IMPLANT
TRAY FOLEY SLVR 16FR TEMP STAT (SET/KITS/TRAYS/PACK) ×6 IMPLANT
TUBING LAP HI FLOW INSUFFLATIO (TUBING) ×6 IMPLANT
UNDERPAD 30X36 HEAVY ABSORB (UNDERPADS AND DIAPERS) ×6 IMPLANT
WATER STERILE IRR 1000ML POUR (IV SOLUTION) ×12 IMPLANT

## 2021-04-25 NOTE — Interval H&P Note (Signed)
History and Physical Interval Note:  Preop labs reviewed. Doppler of left palmar arch showed loss of signal with radial compression. Not c/w PE. Tested with pulse ox on index finger and minimal change in wave form with radial compression. Will evaluate and make final decision based on intraoperative findings.  04/25/2021 7:39 AM  Barry Schmidt  has presented today for surgery, with the diagnosis of CAD.  The various methods of treatment have been discussed with the patient and family. After consideration of risks, benefits and other options for treatment, the patient has consented to  Procedure(s): CORONARY ARTERY BYPASS GRAFTING (CABG) (N/A) RADIAL ARTERY HARVEST (Left) TRANSESOPHAGEAL ECHOCARDIOGRAM (TEE) (N/A) as a surgical intervention.  The patient's history has been reviewed, patient examined, no change in status, stable for surgery.  I have reviewed the patient's chart and labs.  Questions were answered to the patient's satisfaction.     Loreli Slot

## 2021-04-25 NOTE — Anesthesia Procedure Notes (Signed)
Central Venous Catheter Insertion Performed by: Shelton Silvas, MD, anesthesiologist Start/End7/21/2022 7:25 AM, 04/25/2021 7:30 AM Patient location: Pre-op. Preanesthetic checklist: patient identified, IV checked, site marked, risks and benefits discussed, surgical consent, monitors and equipment checked, pre-op evaluation, timeout performed and anesthesia consent Hand hygiene performed  and maximum sterile barriers used  PA cath was placed.Swan type:thermodilution Procedure performed without using ultrasound guided technique. Attempts: 1 Patient tolerated the procedure well with no immediate complications.

## 2021-04-25 NOTE — Procedures (Signed)
Extubation Procedure Note  Patient Details:   Name: Barry Schmidt DOB: 09/10/1961 MRN: [id number]   Airway Documentation:    Vent end date: 04/25/21 Vent end time: 1846   Evaluation  O2 sats: stable throughout Complications: No apparent complications Patient did tolerate procedure well. Bilateral Breath Sounds: Clear   Yes  Pt extubated to  4L [place]. Cuff leak present, VC and NIF in within acceptable parameters, no stridor noted,  RN at bedside, RT will continue to monitor   Rosalita Levan 04/25/2021, 6:47 PM

## 2021-04-25 NOTE — Anesthesia Procedure Notes (Signed)
Procedure Name: Intubation Date/Time: 04/25/2021 8:07 AM Performed by: Fulton Reek, CRNA Pre-anesthesia Checklist: Patient identified, Emergency Drugs available, Suction available and Patient being monitored Patient Re-evaluated:Patient Re-evaluated prior to induction Oxygen Delivery Method: Circle System Utilized Preoxygenation: Pre-oxygenation with 100% oxygen Induction Type: IV induction Ventilation: Mask ventilation without difficulty and Oral airway inserted - appropriate to patient size Laryngoscope Size: Mac and 4 Grade View: Grade I Tube type: Oral Tube size: 8.0 mm Number of attempts: 1 Airway Equipment and Method: Stylet and Oral airway Placement Confirmation: ETT inserted through vocal cords under direct vision, positive ETCO2 and breath sounds checked- equal and bilateral Secured at: 24 cm Tube secured with: Tape Dental Injury: Teeth and Oropharynx as per pre-operative assessment

## 2021-04-25 NOTE — Anesthesia Procedure Notes (Signed)
Central Venous Catheter Insertion Performed by: Shelton Silvas, MD, anesthesiologist Start/End7/21/2022 7:15 AM, 04/25/2021 7:25 AM Patient location: Pre-op. Preanesthetic checklist: patient identified, IV checked, site marked, risks and benefits discussed, surgical consent, monitors and equipment checked, pre-op evaluation, timeout performed and anesthesia consent Position: Trendelenburg Lidocaine 1% used for infiltration and patient sedated Hand hygiene performed , maximum sterile barriers used  and Seldinger technique used Catheter size: 9 Fr Total catheter length 10. Central line was placed.Sheath introducer Swan type:thermodilution PA Cath depth:50 Procedure performed using ultrasound guided technique. Ultrasound Notes:anatomy identified, needle tip was noted to be adjacent to the nerve/plexus identified, no ultrasound evidence of intravascular and/or intraneural injection and image(s) printed for medical record Attempts: 1 Following insertion, line sutured and dressing applied. Post procedure assessment: blood return through all ports, free fluid flow and no air  Patient tolerated the procedure well with no immediate complications.

## 2021-04-25 NOTE — Anesthesia Postprocedure Evaluation (Signed)
Anesthesia Post Note  Patient: Jonavan Vanhorn  Procedure(s) Performed: CORONARY ARTERY BYPASS GRAFTING (CABG)x 3 ON CARDIOPULMONARY BYPASS USING LIMA, LEFT RADIAL ARTERY AND  GSV. (Chest) RADIAL ARTERY HARVEST (Left) TRANSESOPHAGEAL ECHOCARDIOGRAM (TEE) ENDOVEIN HARVEST OF GREATER SAPHENOUS VEIN (Bilateral) APPLICATION OF CELL SAVER     Patient location during evaluation: SICU Anesthesia Type: General Level of consciousness: sedated Pain management: pain level controlled Vital Signs Assessment: post-procedure vital signs reviewed and stable Respiratory status: patient remains intubated per anesthesia plan Cardiovascular status: stable Postop Assessment: no apparent nausea or vomiting Anesthetic complications: no   No notable events documented.  Last Vitals:  Vitals:   04/25/21 1400 04/25/21 1500  BP:    Pulse: 80 80  Resp: 12 12  Temp: (!) 36.3 C 36.5 C  SpO2: 98% 100%    Last Pain:  Vitals:   04/25/21 0165  TempSrc: Oral  PainSc:                  Shelton Silvas

## 2021-04-25 NOTE — Op Note (Signed)
Barry Schmidt, Barry Schmidt MEDICAL RECORD NO: [id number] ACCOUNT NO: [id number] DATE OF BIRTH: 1961-09-07 FACILITY: [facility] LOCATION: [facility] PHYSICIAN: Salvatore Decent. Dorris Fetch, MD  Operative Report   DATE OF PROCEDURE: 04/25/2021  PREOPERATIVE DIAGNOSIS:  Left main and 3-vessel coronary artery disease.  POSTOPERATIVE DIAGNOSIS:  Left main and 3-vessel coronary artery disease.  PROCEDURES PERFORMED:   Median sternotomy, extracorporeal circulation, Coronary artery bypass grafting x 3 Left internal mammary artery to left anterior descending, Left radial artery to obtuse marginal 2, Saphenous vein graft to posterior descending Endoscopic vein harvest of right and left thighs.  SURGEON:  Charlett Lango, MD  ASSISTANT: Lowella Dandy, PA  SECOND ASSISTANT: Gershon Crane, PA  ANESTHESIA:  General.  FINDINGS: Transesophageal echocardiography showed preserved left ventricular wall motion with no significant valvular pathology. Mammary and radial relatively small but good quality vessels.  Saphenous vein from right leg unusable.  Saphenous vein from  the left leg small, good quality. Good targets.  CLINICAL NOTE:  Mr. Barry Schmidt is a 60 year old man with a history of diabetes and dyslipidemia, who presented with a 22-month history of chest pressure.  He underwent cardiac catheterization which revealed left main and 3-vessel disease with chronic total  occlusion of the LAD, which filled via right to left collaterals.  Ejection fraction was estimated at 35-40%, although it had appeared to be about 55% by echocardiography.  He was advised to undergo coronary artery bypass grafting.  The indications,  risks, benefits, and alternatives were discussed in detail with the patient.  He understood and accepted the risks and agreed to proceed.  OPERATIVE NOTE:  Mr. Barry Schmidt was brought to the preoperative holding area on 04/25/2021.  The Allen's test was confirmed with pulse oximetry to be normal.  Anesthesia placed a  Swan-Ganz catheter and a right radial arterial line.  He was taken to the  operating room and anesthetized and intubated.  Intravenous antibiotics were administered.  A Foley catheter was placed.  Transesophageal echocardiography was performed by Dr. Hart Schmidt. Please see a separately dictated note for full details.  Findings  briefly as noted above.  The chest, abdomen and legs and left arm were prepped and draped in the usual sterile fashion.  Timeout was performed.  Harvesting of the conduits was performed simultaneously. An incision was made in the medial aspect of the right leg at the level of the knee.  The greater saphenous vein was harvested from the right thigh endoscopically.  An  incision was made over the volar aspect of the right wrist.  A small incision was made initially. A short segment of the radial artery was dissected out.  There was a good pulse distally with proximal occlusion.  There was a good Doppler signal in the  palmar arch with occlusion of the radial artery.  The incision then was extended to just below the antecubital fossa and the radial artery was harvested using the Harmonic scalpel.  A median sternotomy was performed and the left internal mammary artery  was harvested using standard technique.  The mammary was relatively small and relatively adherent to the chest wall, but it was a good quality vessel with excellent flow when divided distally.  2000 units of heparin was administered during the vessel  harvest.  The remainder of the full heparin dose was given prior to opening the pericardium.  The saphenous vein from the right leg was not suitable for use as a bypass.  Therefore, additional saphenous vein was harvested from the left leg.  This was a  relatively small, but otherwise good quality vessel.  The pericardium then was opened.  The  ascending aorta was inspected.  It was of normal caliber, with no significant atherosclerotic disease.  After confirming adequate  anticoagulation with ACT measurement, the aorta was cannulated via concentric 2-0 Ethibond pledgeted pursestring sutures.  A  dual stage venous cannula was placed via a pursestring suture in the right atrial appendage.  Cardiopulmonary bypass was initiated.  On initially attempting to start bypass, there was no forward flow through the aortic cannula.  Inspection of the line revealed  air within the line that was not noted prior to attempting to initiate bypass.  The cannula was clamped as was the tubing.  The tubing was reconnected to the cannula, ensuring no air was present and bypass was initiated and there was good flow with  normal pressures.  Inspection of the aorta with transesophageal echocardiography revealed no evidence of aortic dissection.  The aortic cannula was in place and not kinked at the time of initial attempt to initiate bypass. The patient was on full  cardiopulmonary bypass within 2 minutes of the original failure to initiate and then cardiopulmonary bypass was uneventful through the remainder of the procedure.  The coronary arteries were inspected.  Anastomotic sites were chosen.  The conduits were inspected and cut to length.  A foam pad was placed in the pericardium.  A temperature probe was placed in the myocardial septum.  A retrograde cardioplegia  cannula was placed via pursestring suture in the right atrium and directed into the coronary sinus.  An antegrade cardioplegia cannula was placed in the ascending aorta.  The aorta was cross clamped.  The left ventricle was emptied via the aortic root vent.  Cardiac arrest then was achieved with a combination of cold antegrade and retrograde blood cardioplegia and topical iced saline.  An initial 500 mL of cardioplegia  was administered antegrade.  There was a diastolic arrest, but relatively slow septal cooling.  An additional 500 mL was given retrograde and there was rapid septal cooling to less than 10 degrees Celsius.  A  reversed saphenous vein graft was placed end-to-side to the posterior descending branch of the right coronary.  This was a 1.5 mm good quality target.  The vein was of good quality, end-to-side anastomosis was performed with a running 7-0 Prolene suture.   All anastomoses were probed proximally and distally prior to tying the sutures. Cardioplegia was administered through the vein graft.  There was good flow and good hemostasis.  The heart was elevated, exposing the posterolateral wall.  OM2 was the largest of the circumflex branches.  An arteriotomy was made there and the left radial was bevelled at its distal end and anastomosed end-to-side to OM2 with a running 8-0 Prolene  suture.  Again, a probe passed easily proximally and distally.  Cardioplegia was administered down the aortic root and there was good backbleeding from the radial artery.  The left internal mammary artery was brought through a window in the pericardium.  The distal end was bevelled.  It was anastomosed end-to-side to the distal LAD.  It should be noted that additional retrograde cardioplegia was administered prior to doing  the mammary to LAD anastomosis.  At the completion of the mammary to LAD anastomosis, the bulldog clamp was briefly removed. Septal rewarming was noted.  The bulldog clamp was replaced and the mammary pedicle was tacked to the epicardial surface of the  heart with 6-0 Prolene sutures.  Additional cardioplegia  was administered.  The proximal anastomosis for the vein graft to the posterior descending was performed to a 4.0 mm punch aortotomy.  A small piece of saphenous vein was sewn to a separate 4.0 punch aortotomy and then the proximal end of the radial graft was bevelled and  was anastomosed end-to-side to the short interposition of vein with a running 7-0 Prolene suture.  At the completion of this anastomosis, the patient was placed in Trendelenburg position.  A warm dose of retrograde cardioplegia was  administered.  The  bulldog clamp was again removed from the left mammary artery.  De-airing was performed and the aortic crossclamp was removed.  The total crossclamp time was 74 minutes.  The patient spontaneously resumed bradycardic rhythm and did not require  defibrillation.  While rewarming was completed,  all proximal and distal anastomoses were inspected for hemostasis.  Epicardial pacing wires were placed on the right ventricle and right atrium and atrial pacing was initiated at 80 beats per minute.  When the patient had  rewarmed to a core temperature of 37 degrees Celsius, he was weaned from cardiopulmonary bypass on the first attempt.  Total bypass time was 119 minutes.  He did not require inotropic support.  Post-bypass transesophageal echocardiography was essentially  unchanged from the prebypass study.  A test dose of protamine was administered and was well tolerated.  The atrial and aortic cannula were removed.  The remainder of the protamine was administered without incident.  The chest was copiously irrigated with warm saline.  Hemostasis was  achieved.  Left pleural and mediastinal chest tubes were placed through separate subcostal incisions.  The sternum was closed with a combination of single and double heavy gauge stainless steel wires.  The pectoralis fascia, subcutaneous tissue and skin  were closed in standard fashion.  All sponge, needle and instrument counts were correct at the end of the procedure.  The patient was taken from the operating room to the surgical intensive care unit, intubated and in good condition.   SHW D: 04/25/2021 5:44:23 pm T: 04/25/2021 9:28:00 pm  JOB: [id number]/ [id number]

## 2021-04-25 NOTE — Transfer of Care (Signed)
Immediate Anesthesia Transfer of Care Note  Patient: Raheel Kunkle  Procedure(s) Performed: CORONARY ARTERY BYPASS GRAFTING (CABG)x 3 ON CARDIOPULMONARY BYPASS USING LIMA, LEFT RADIAL ARTERY AND  GSV. (Chest) RADIAL ARTERY HARVEST (Left) TRANSESOPHAGEAL ECHOCARDIOGRAM (TEE) ENDOVEIN HARVEST OF GREATER SAPHENOUS VEIN (Bilateral) APPLICATION OF CELL SAVER  Patient Location: ICU  Anesthesia Type:General  Level of Consciousness: sedated and Patient remains intubated per anesthesia plan  Airway & Oxygen Therapy: Patient remains intubated per anesthesia plan and Patient placed on Ventilator (see vital sign flow sheet for setting)  Post-op Assessment: Report given to RN and Post -op Vital signs reviewed and stable  Post vital signs: Reviewed and stable  Last Vitals:  Vitals Value Taken Time  BP    Temp 36.5 C 04/25/21 1346  Pulse 80 04/25/21 1346  Resp 12 04/25/21 1346  SpO2 95 % 04/25/21 1346  Vitals shown include unvalidated device data.  Last Pain:  Vitals:   04/25/21 0614  TempSrc: Oral  PainSc:       Patients Stated Pain Goal: 5 (04/24/21 0824)  Complications: No notable events documented.

## 2021-04-25 NOTE — Plan of Care (Signed)
  Problem: Education: Goal: Knowledge of General Education information will improve Description: Including pain rating scale, medication(s)/side effects and non-pharmacologic comfort measures Outcome: Progressing   Problem: Health Behavior/Discharge Planning: Goal: Ability to manage health-related needs will improve Outcome: Progressing   Problem: Clinical Measurements: Goal: Ability to maintain clinical measurements within normal limits will improve Outcome: Progressing Goal: Will remain free from infection Outcome: Progressing Goal: Diagnostic test results will improve Outcome: Progressing Goal: Respiratory complications will improve Outcome: Progressing Goal: Cardiovascular complication will be avoided Outcome: Progressing   Problem: Activity: Goal: Risk for activity intolerance will decrease Outcome: Progressing   Problem: Nutrition: Goal: Adequate nutrition will be maintained Outcome: Progressing   Problem: Coping: Goal: Level of anxiety will decrease Outcome: Progressing   Problem: Elimination: Goal: Will not experience complications related to bowel motility Outcome: Progressing Goal: Will not experience complications related to urinary retention Outcome: Progressing   Problem: Pain Managment: Goal: General experience of comfort will improve Outcome: Progressing   Problem: Safety: Goal: Ability to remain free from injury will improve Outcome: Progressing   Problem: Education: Goal: Understanding of CV disease, CV risk reduction, and recovery process will improve Outcome: Progressing Goal: Individualized Educational Video(s) Outcome: Progressing   Problem: Activity: Goal: Ability to return to baseline activity level will improve Outcome: Progressing

## 2021-04-25 NOTE — Progress Notes (Signed)
      [address]       [name] [address]             [phone]      S/p CABG x 3  Intubated, awake and wants ETT out  BP (!) 104/59   Pulse 80   Temp 98.78 F (37.1 C)   Resp 12   Ht 5\' 8"  (1.727 m)   Wt 97.3 kg   SpO2 100%   BMI 32.61 kg/m  20/18 Ci= 1.8 On low dose NTG and neo   Intake/Output Summary (Last 24 hours) at 04/25/2021 1838 Last data filed at 04/25/2021 1800 Gross per 24 hour  Intake 2891.07 ml  Output 3645 ml  Net -753.93 ml   Minimal CT output  Neuro intact  ABG OK- extubate if meets parameters Hct 28  Doing well early postop  04/27/2021 C. Viviann Spare, MD [facility] [phone]

## 2021-04-25 NOTE — Progress Notes (Signed)
      [address]       [place] [address]             [phone]      Stable on arrival to 2H ECG no significant change from preop Hemodynamics stable Left index in ring finger warm with brisk cap refill  Viviann Spare C. Dorris Fetch, MD [facility] [phone]

## 2021-04-25 NOTE — Anesthesia Preprocedure Evaluation (Addendum)
Anesthesia Evaluation  Patient identified by MRN, date of birth, ID band Patient awake    Reviewed: Allergy & Precautions, NPO status , Patient's Chart, lab work & pertinent test results, reviewed documented beta blocker date and time   Airway Mallampati: III  TM Distance: <3 FB Neck ROM: Full    Dental  (+) Teeth Intact, Caps, Dental Advisory Given   Pulmonary former smoker,    Pulmonary exam normal        Cardiovascular + CAD   Rhythm:Regular Rate:Normal  Echo:  1. Left ventricular ejection fraction, by estimation, is 55 to 60%. The  left ventricle has normal function. The left ventricle has no regional  wall motion abnormalities. Left ventricular diastolic parameters were  normal. There is hypokinesis of the left  ventricular, apical segment.  2. Right ventricular systolic function is normal. The right ventricular  size is normal. Tricuspid regurgitation signal is inadequate for assessing  PA pressure.  3. The mitral valve is normal in structure. Trivial mitral valve  regurgitation. No evidence of mitral stenosis.  4. The aortic valve is tricuspid. Aortic valve regurgitation is not  visualized. Mild aortic valve sclerosis is present, with no evidence of  aortic valve stenosis.  5. The inferior vena cava is normal in size with <50% respiratory  variability, suggesting right atrial pressure of 8 mmHg.    Neuro/Psych negative neurological ROS  negative psych ROS   GI/Hepatic negative GI ROS, Neg liver ROS,   Endo/Other  diabetes, Type 2, Oral Hypoglycemic Agents  Renal/GU      Musculoskeletal   Abdominal Normal abdominal exam  (+)   Peds  Hematology   Anesthesia Other Findings   Reproductive/Obstetrics                            Anesthesia Physical Anesthesia Plan  ASA: 4  Anesthesia Plan: General   Post-op Pain Management:    Induction: Intravenous  PONV Risk Score and  Plan: 2 and Ondansetron and Midazolam  Airway Management Planned: Oral ETT  Additional Equipment: Arterial line, CVP, PA Cath, TEE and Ultrasound Guidance Line Placement  Intra-op Plan:   Post-operative Plan: Post-operative intubation/ventilation  Informed Consent: I have reviewed the patients History and Physical, chart, labs and discussed the procedure including the risks, benefits and alternatives for the proposed anesthesia with the patient or authorized representative who has indicated his/her understanding and acceptance.     Dental advisory given  Plan Discussed with: CRNA  Anesthesia Plan Comments:        Anesthesia Quick Evaluation

## 2021-04-25 NOTE — Brief Op Note (Signed)
04/24/2021 - 04/25/2021  1:54 PM  PATIENT:  Barry Schmidt  60 y.o. male  PRE-OPERATIVE DIAGNOSIS:  LEFT MAIN, 3 VESSEL CAD  POST-OPERATIVE DIAGNOSIS:  LEFT MAIN, 3 VESSEL CAD  PROCEDURE:  Procedure(s):  CORONARY ARTERY BYPASS GRAFTING x 3 -LIMA to LAD -SVG to PDA -LEFT RADIAL ARTERY to OM 2  RADIAL ARTERY HARVEST (Left) -Open (35 min)  TRANSESOPHAGEAL ECHOCARDIOGRAM (TEE) (N/A)  ENDOVEIN HARVEST OF GREATER SAPHENOUS VEIN (Bilateral) -Left and Right Thigh ( 90 min)  APPLICATION OF CELL SAVER  SURGEON:  Surgeon(s) and Role:    Loreli Slot, MD - Primary  PHYSICIAN ASSISTANT: Erin Barrett PA-C, Gershon Crane PA-C  ASSISTANTS: Tanda Rockers RNFA   ANESTHESIA:   general  EBL:  800 mL   BLOOD ADMINISTERED: CELLSAVER  DRAINS:  Left and Right Pleural Chest Tube, Mediastinal chest drains    LOCAL MEDICATIONS USED:  NONE  SPECIMEN:  No Specimen  DISPOSITION OF SPECIMEN:  N/A  COUNTS:  YES  TOURNIQUET:  * No tourniquets in log *  DICTATION: .Dragon Dictation  PLAN OF CARE: Admit to inpatient   PATIENT DISPOSITION:  ICU - intubated and hemodynamically stable.   Delay start of Pharmacological VTE agent (>24hrs) due to surgical blood loss or risk of bleeding: yes  Vein from right thigh not usable Good quality conduits and targets LV EF 50-60% by echo No arterial inflow at initiation of bypass. Air found in tubing. Once removed bypass proceeded without incident.

## 2021-04-25 NOTE — Hospital Course (Addendum)
History of Present Illness:  Mr. Barry Schmidt is a 60 yo man with a positive family history of CAD. He has no prior history of CAD.  He has a history of type II diabetes, dyslipidemia and remote low volume tobacco use. He has about a 3 month history of chest pressure with exertion relieved with rest. No recent rest or nocturnal pain, but did have an episode of nocturnal pain about 18 months ago which led him to quit smoking.   He saw Dr. Tresa Endo who recommended cardiac catheterization. 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 EF estimated 35-40% vs 55% by Echocardiogram.  He was admitted for possible bypass evaluation.    Hospital Course:  He was evaluated by Dr. Dorris Fetch who was in agreement the patient would benefit from coronary bypass grafting.  The risks and benefits of the procedure were explained to the patient and he was agreeable to proceed.  He was taken to the operating room and underwent CABG x 3 utilizing LIMA to LAD, left radial artery to OM2, and svg to PDA.  He underwent open left radial artery harvest and endoscopic harvest of greater saphenous vein from his right and left thigh.  He tolerated the procedure and was taken to the SICU in stable condition.  The patient was extubated the evening of surgery.  His Swan and arterial line were removed on POD #1.  The NTG drip was discontinued and he was started on Imdur for his radial artery graft.  His chest tubes were removed without difficulty.  Follow up CXR showed no pneumo and slightly improved but persistently low lung volumes with similar moderate bibasilar atelectasis.  He was started on lasix for mild volume overloaded state. He was transferred to the progressive care unit on 04/26/2021.  He continues to make progress.  He had some brief episodes of tachycardia and his Lopressor dose was increased.  His pacing wires were removed without difficulty.   He complained of difficulty expectorating sputum and was started on Mucinex.  He developed hypokalemia and his supplementation was increased.  Follow up potassium level was 3.6  He was also hypotensive and his Imdur dose was decreased.  Hypotension resolved.  He is ambulating without difficulty.  He is ambulating without difficulty.  He is felt medically stable for discharge home today.

## 2021-04-25 NOTE — Progress Notes (Signed)
RT unable to perform ABG on pt. Due to pt. Being restricted on both sides for procedure. RN aware.

## 2021-04-25 NOTE — Anesthesia Procedure Notes (Signed)
Arterial Line Insertion Start/End7/21/2022 7:00 AM Performed by: Kerin Ransom, CRNA, CRNA  Patient location: Pre-op. Preanesthetic checklist: patient identified, IV checked, site marked, risks and benefits discussed, surgical consent, monitors and equipment checked, pre-op evaluation, timeout performed and anesthesia consent Lidocaine 1% used for infiltration Right, radial was placed Catheter size: 20 G Hand hygiene performed  and maximum sterile barriers used   Attempts: 1 Procedure performed without using ultrasound guided technique. Following insertion, dressing applied and Biopatch. Post procedure assessment: normal and unchanged  Patient tolerated the procedure well with no immediate complications.

## 2021-04-25 NOTE — OR Nursing (Signed)
Twenty minute call to 2 Heart at 1306. Spoke to [place]. Cath Lab also notified of timing.

## 2021-04-26 ENCOUNTER — Inpatient Hospital Stay (HOSPITAL_COMMUNITY): Payer: BC Managed Care – PPO

## 2021-04-26 ENCOUNTER — Encounter (HOSPITAL_COMMUNITY): Payer: Self-pay | Admitting: Thoracic Surgery (Cardiothoracic Vascular Surgery)

## 2021-04-26 DIAGNOSIS — Z951 Presence of aortocoronary bypass graft: Secondary | ICD-10-CM

## 2021-04-26 LAB — CBC
HCT: 33 % — ABNORMAL LOW (ref 39.0–52.0)
HCT: 33.5 % — ABNORMAL LOW (ref 39.0–52.0)
Hemoglobin: 11.1 g/dL — ABNORMAL LOW (ref 13.0–17.0)
Hemoglobin: 11.1 g/dL — ABNORMAL LOW (ref 13.0–17.0)
MCH: 28.8 pg (ref 26.0–34.0)
MCH: 29.2 pg (ref 26.0–34.0)
MCHC: 33.6 g/dL (ref 30.0–36.0)
MCHC: 33.1 g/dL (ref 30.0–36.0)
MCV: 85.7 fL (ref 80.0–100.0)
MCV: 88.2 fL (ref 80.0–100.0)
Platelets: 142 10*3/uL — ABNORMAL LOW (ref 150–400)
Platelets: 158 10*3/uL (ref 150–400)
RBC: 3.85 MIL/uL — ABNORMAL LOW (ref 4.22–5.81)
RBC: 3.8 MIL/uL — ABNORMAL LOW (ref 4.22–5.81)
RDW: 13.2 % (ref 11.5–15.5)
RDW: 13.7 % (ref 11.5–15.5)
WBC: 11.4 10*3/uL — ABNORMAL HIGH (ref 4.0–10.5)
WBC: 12.6 10*3/uL — ABNORMAL HIGH (ref 4.0–10.5)
nRBC: 0 % (ref 0.0–0.2)
nRBC: 0 % (ref 0.0–0.2)

## 2021-04-26 LAB — POCT I-STAT 7, (LYTES, BLD GAS, ICA,H+H)
Acid-base deficit: 1 mmol/L (ref 0.0–2.0)
Bicarbonate: 24.4 mmol/L (ref 20.0–28.0)
Calcium, Ion: 1.11 mmol/L — ABNORMAL LOW (ref 1.15–1.40)
HCT: 34 % — ABNORMAL LOW (ref 39.0–52.0)
Hemoglobin: 11.6 g/dL — ABNORMAL LOW (ref 13.0–17.0)
O2 Saturation: 96 %
Patient temperature: 37.3
Potassium: 4.4 mmol/L (ref 3.5–5.1)
Sodium: 140 mmol/L (ref 135–145)
TCO2: 26 mmol/L (ref 22–32)
pCO2 arterial: 43.1 mmHg (ref 32.0–48.0)
pH, Arterial: 7.363 (ref 7.350–7.450)
pO2, Arterial: 89 mmHg (ref 83.0–108.0)

## 2021-04-26 LAB — BASIC METABOLIC PANEL
Anion gap: 7 (ref 5–15)
Anion gap: 6 (ref 5–15)
BUN: 14 mg/dL (ref 6–20)
BUN: 20 mg/dL (ref 6–20)
CO2: 22 mmol/L (ref 22–32)
CO2: 24 mmol/L (ref 22–32)
Calcium: 7.7 mg/dL — ABNORMAL LOW (ref 8.9–10.3)
Calcium: 8 mg/dL — ABNORMAL LOW (ref 8.9–10.3)
Chloride: 107 mmol/L (ref 98–111)
Chloride: 103 mmol/L (ref 98–111)
Creatinine, Ser: 0.79 mg/dL (ref 0.61–1.24)
Creatinine, Ser: 1.05 mg/dL (ref 0.61–1.24)
GFR, Estimated: >60 mL/min (ref >60)
GFR, Estimated: >60 mL/min (ref >60)
Glucose, Bld: 110 mg/dL — ABNORMAL HIGH (ref 70–99)
Glucose, Bld: 187 mg/dL — ABNORMAL HIGH (ref 70–99)
Potassium: 3.8 mmol/L (ref 3.5–5.1)
Potassium: 5.8 mmol/L — ABNORMAL HIGH (ref 3.5–5.1)
Sodium: 136 mmol/L (ref 135–145)
Sodium: 133 mmol/L — ABNORMAL LOW (ref 135–145)

## 2021-04-26 LAB — MAGNESIUM
Magnesium: 2.5 mg/dL — ABNORMAL HIGH (ref 1.7–2.4)
Magnesium: 2.3 mg/dL (ref 1.7–2.4)

## 2021-04-26 LAB — GLUCOSE, CAPILLARY
Glucose-Capillary: 103 mg/dL — ABNORMAL HIGH (ref 70–99)
Glucose-Capillary: 116 mg/dL — ABNORMAL HIGH (ref 70–99)
Glucose-Capillary: 116 mg/dL — ABNORMAL HIGH (ref 70–99)
Glucose-Capillary: 119 mg/dL — ABNORMAL HIGH (ref 70–99)
Glucose-Capillary: 119 mg/dL — ABNORMAL HIGH (ref 70–99)
Glucose-Capillary: 121 mg/dL — ABNORMAL HIGH (ref 70–99)
Glucose-Capillary: 129 mg/dL — ABNORMAL HIGH (ref 70–99)
Glucose-Capillary: 138 mg/dL — ABNORMAL HIGH (ref 70–99)
Glucose-Capillary: 152 mg/dL — ABNORMAL HIGH (ref 70–99)
Glucose-Capillary: 155 mg/dL — ABNORMAL HIGH (ref 70–99)
Glucose-Capillary: 175 mg/dL — ABNORMAL HIGH (ref 70–99)

## 2021-04-26 MED ORDER — INSULIN DETEMIR 100 UNIT/ML ~~LOC~~ SOLN
18.0000 [IU] | Freq: Two times a day (BID) | SUBCUTANEOUS | Status: DC
  Administered 2021-04-26 – 2021-04-27 (×4): 18 [IU] via SUBCUTANEOUS
  Filled 2021-04-26 (×7): qty 0.18

## 2021-04-26 MED ORDER — KETOROLAC TROMETHAMINE 15 MG/ML IJ SOLN
15.0000 mg | Freq: Four times a day (QID) | INTRAMUSCULAR | Status: AC
  Administered 2021-04-26 – 2021-04-27 (×4): 15 mg via INTRAVENOUS
  Filled 2021-04-26 (×4): qty 1

## 2021-04-26 MED ORDER — INSULIN ASPART 100 UNIT/ML IJ SOLN
0.0000 [IU] | INTRAMUSCULAR | Status: DC
  Administered 2021-04-26: 2 [IU] via SUBCUTANEOUS
  Administered 2021-04-26: 4 [IU] via SUBCUTANEOUS
  Administered 2021-04-26 – 2021-04-27 (×3): 2 [IU] via SUBCUTANEOUS
  Administered 2021-04-27: 4 [IU] via SUBCUTANEOUS

## 2021-04-26 MED ORDER — ISOSORBIDE MONONITRATE ER 30 MG PO TB24
30.0000 mg | ORAL_TABLET | Freq: Every day | ORAL | Status: DC
  Administered 2021-04-26 – 2021-04-29 (×4): 30 mg via ORAL
  Filled 2021-04-26 (×4): qty 1

## 2021-04-26 MED ORDER — LIP MEDEX EX OINT
TOPICAL_OINTMENT | CUTANEOUS | Status: DC | PRN
  Filled 2021-04-26: qty 7

## 2021-04-26 MED ORDER — ENOXAPARIN SODIUM 40 MG/0.4ML IJ SOSY
40.0000 mg | PREFILLED_SYRINGE | Freq: Every day | INTRAMUSCULAR | Status: DC
  Administered 2021-04-26 – 2021-04-30 (×5): 40 mg via SUBCUTANEOUS
  Filled 2021-04-26 (×5): qty 0.4

## 2021-04-26 MED ORDER — POTASSIUM CHLORIDE 10 MEQ/50ML IV SOLN
10.0000 meq | INTRAVENOUS | Status: AC
Start: 2021-04-26 — End: 2021-04-26
  Administered 2021-04-26 (×4): 10 meq via INTRAVENOUS
  Filled 2021-04-26 (×4): qty 50

## 2021-04-26 MED ORDER — FUROSEMIDE 10 MG/ML IJ SOLN
20.0000 mg | Freq: Once | INTRAMUSCULAR | Status: AC
  Administered 2021-04-26: 20 mg via INTRAVENOUS
  Filled 2021-04-26: qty 2

## 2021-04-26 NOTE — Discharge Summary (Signed)
[address]       [place], [address]             [phone]    Physician Discharge Summary  Patient ID: Barry Schmidt MRN: [id number] DOB/AGE: 1961/05/03 60 y.o.  Admit date: 04/24/2021 Discharge date: 05/01/2021  Admission Diagnoses:  Patient Active Problem List   Diagnosis Date Noted   Coronary artery disease involving coronary bypass graft of native heart with unstable angina pectoris ([place]) 04/24/2021   Exertional angina ([place])    Mixed hyperlipidemia 04/02/2021   Former smoker 04/02/2021   Adhesive capsulitis of left shoulder 01/19/2020   Controlled type 2 diabetes mellitus with neuropathy ([place]) 10/14/2018   On statin therapy due to risk of future cardiovascular event 10/14/2018   Femoral neuropathy of right lower extremity 10/14/2018   Discharge Diagnoses:  Patient Active Problem List   Diagnosis Date Noted   S/P CABG x 3    Coronary artery disease involving coronary bypass graft of native heart with unstable angina pectoris ([place]) 04/24/2021   Exertional angina ([place])    Mixed hyperlipidemia 04/02/2021   Former smoker 04/02/2021   Adhesive capsulitis of left shoulder 01/19/2020   Controlled type 2 diabetes mellitus with neuropathy ([place]) 10/14/2018   On statin therapy due to risk of future cardiovascular event 10/14/2018   Femoral neuropathy of right lower extremity 10/14/2018   Discharged Condition: good  History of Present Illness:  Mr. Barry Schmidt is a 60 yo man with a positive family history of CAD. He has no prior history of CAD.  He has a history of type II diabetes, dyslipidemia and remote low volume tobacco use. He has about a 3 month history of chest pressure with exertion relieved with rest. No recent rest or nocturnal pain, but did have an episode of nocturnal pain about 18 months ago which led him to quit smoking.   He saw Dr. Claiborne Billings who recommended cardiac catheterization. That was done on 04/24/2021 and revealed left main and 3 vessel CAD  with total occlusion of the LAD, filling via Right to left collaterals. EF estimated 35-40% vs 55% by Echocardiogram.  He was admitted for possible bypass evaluation.    Hospital Course:  He was evaluated by Dr. Roxan Hockey who was in agreement the patient would benefit from coronary bypass grafting.  The risks and benefits of the procedure were explained to the patient and he was agreeable to proceed.  He was taken to the operating room and underwent CABG x 3 utilizing LIMA to LAD, left radial artery to OM2, and svg to PDA.  He underwent open left radial artery harvest and endoscopic harvest of greater saphenous vein from his right and left thigh.  He tolerated the procedure and was taken to the SICU in stable condition.  The patient was extubated the evening of surgery.  His Swan and arterial line were removed on POD #1.  The NTG drip was discontinued and he was started on Imdur for his radial artery graft.  His chest tubes were removed without difficulty.  Follow up CXR showed no pneumo and slightly improved but persistently low lung volumes with similar moderate bibasilar atelectasis.  He was started on lasix for mild volume overloaded state. He was transferred to the progressive care unit on 04/26/2021.  He continues to make progress.  He had some brief episodes of tachycardia and his Lopressor dose was increased.  His pacing wires were removed without difficulty.  He complained of difficulty expectorating  sputum and was started on Mucinex.  He developed hypokalemia and his supplementation was increased.  Follow up potassium level was 3.6  He was also hypotensive and his Imdur dose was decreased.  Hypotension resolved.  He is ambulating without difficulty.  He is ambulating without difficulty.  He is felt medically stable for discharge home today.  Significant Diagnostic Studies: angiography:     Prox RCA lesion is 70% stenosed.   RPAV lesion is 50% stenosed.   Dist RCA lesion is 50% stenosed.   Ost  LAD to Prox LAD lesion is 100% stenosed.   Mid LM to Dist LM lesion is 85% stenosed.   Ramus lesion is 80% stenosed.   Ost Cx lesion is 70% stenosed.   Mid Cx to Dist Cx lesion is 50% stenosed.   There is moderate left ventricular systolic dysfunction.   LV end diastolic pressure is normal.  Treatments: surgery:   Operative Report   DATE OF PROCEDURE: 04/25/2021   PREOPERATIVE DIAGNOSIS:  Left main and 3-vessel coronary artery disease.   POSTOPERATIVE DIAGNOSIS:  Left main and 3-vessel coronary artery disease.   PROCEDURES PERFORMED:  Median sternotomy, extracorporeal circulation, coronary artery bypass grafting x3 (left internal mammary artery to left anterior descending, left radial artery to obtuse marginal 2, saphenous vein graft to posterior descending), endoscopic vein harvest of right and left thighs.   SURGEON:  Modesto Charon, MD   ASSISTANT: Ellwood Handler, PA-C   Discharge Exam: Blood pressure 119/66, pulse 75, temperature 99 F (37.2 C), temperature source Oral, resp. rate 13, height _0  (1.727 m), weight 95.4 kg, SpO2 93 %.  General appearance: alert, cooperative, and no distress Heart: regular rate and rhythm Lungs: clear to auscultation bilaterally Abdomen: soft, non-tender; bowel sounds normal; no masses,  no organomegaly Extremities: no edema present Wound: clean and dry, left radial site is starting to "wake up"   Discharge Medications:  The patient has been discharged on:   1.Beta Blocker:  Yes [  X ]                              No   [   ]                              If No, reason:  2.Ace Inhibitor/ARB: Yes [   ]                                     No  [  x  ]                                     If No, reason: labile BP, on Imdur for radial artery  3.Statin:   Yes [  X ]                  No  [   ]                  If No, reason:  4.Ecasa:  Yes  [ X  ]                  No   [   ]  If No, reason:    Discharge  Instructions     Amb Referral to Cardiac Rehabilitation   Complete by: As directed    Diagnosis: CABG   CABG X ___: 3   After initial evaluation and assessments completed: Virtual Based Care may be provided alone or in conjunction with Phase 2 Cardiac Rehab based on patient barriers.: Yes      Allergies as of 05/01/2021       Reactions   Penicillins Hives   Reaction: 15-20 years ago        Medication List     TAKE these medications    Accu-Chek Aviva Plus test strip Generic drug: glucose blood USE AS INSTRUCTED TO TEST BLOOD GLUCOSE   acetaminophen 325 MG tablet Commonly known as: TYLENOL Take 2 tablets (650 mg total) by mouth every 6 (six) hours as needed for mild pain.   aspirin EC 81 MG tablet Take 81 mg by mouth at bedtime.   dapagliflozin propanediol 10 MG Tabs tablet Commonly known as: Farxiga Take 1 tablet (10 mg total) by mouth daily.   guaiFENesin 600 MG 12 hr tablet Commonly known as: MUCINEX Take 2 tablets (1,200 mg total) by mouth 2 (two) times daily as needed for to loosen phlegm.   isosorbide mononitrate 30 MG 24 hr tablet Commonly known as: IMDUR Take 0.5 tablets (15 mg total) by mouth daily.   lip balm ointment Apply topically as needed for lip care.   metFORMIN 1000 MG tablet Commonly known as: GLUCOPHAGE Take 1 tablet (1,000 mg total) by mouth 2 (two) times daily with a meal.   metoprolol succinate 25 MG 24 hr tablet Commonly known as: Toprol XL Take 1 tablet (25 mg total) by mouth at bedtime.   nitroGLYCERIN 0.4 MG SL tablet Commonly known as: NITROSTAT Place 1 tablet (0.4 mg total) under the tongue every 5 (five) minutes as needed for chest pain.   ONE TOUCH ULTRA 2 w/Device Kit 1 kit by Does not apply route 2 (two) times daily.   onetouch ultrasoft lancets Use as instructed   oxyCODONE 5 MG immediate release tablet Commonly known as: Oxy IR/ROXICODONE Take 1-2 tablets (5-10 mg total) by mouth every 4 (four) hours as needed for  severe pain.   rosuvastatin 40 MG tablet Commonly known as: CRESTOR Take 1 tablet (40 mg total) by mouth daily. What changed:  medication strength how much to take        Follow-up Information     Melrose Nakayama, MD Follow up on 06/04/2021.   Specialty: Cardiothoracic Surgery Why: Appointment is at 12:00 Contact information: [address] [place] [address] [phone]         Deberah Pelton, NP Follow up on 05/20/2021.   Specialty: Cardiology Why: Appointment is at 11:15 Contact information: [address] [place] [address] [phone]                 Signed: Ellwood Handler, PA-C 05/01/2021, 7:23 AM

## 2021-04-26 NOTE — Discharge Instructions (Signed)

## 2021-04-26 NOTE — Progress Notes (Signed)
1 Day Post-Op Procedure(s) (LRB): CORONARY ARTERY BYPASS GRAFTING (CABG)x 3 ON CARDIOPULMONARY BYPASS USING LIMA, LEFT RADIAL ARTERY AND  GSV. (N/A) RADIAL ARTERY HARVEST (Left) TRANSESOPHAGEAL ECHOCARDIOGRAM (TEE) (N/A) ENDOVEIN HARVEST OF GREATER SAPHENOUS VEIN (Bilateral) APPLICATION OF CELL SAVER Subjective: C/o back pain, sensation heart is beating strongly  Objective: Vital signs in last 24 hours: Temp:  [97.34 F (36.3 C)-99.86 F (37.7 C)] 98.78 F (37.1 C) (07/22 0700) Pulse Rate:  [55-83] 80 (07/22 0700) Cardiac Rhythm: Atrial paced (07/22 0400) Resp:  [12-21] 16 (07/22 0700) BP: (91-105)/(62-68) 105/68 (07/22 0700) SpO2:  [91 %-100 %] 93 % (07/22 0700) Arterial Line BP: (86-138)/(51-81) 138/69 (07/22 0700) FiO2 (%):  [40 %-50 %] 40 % (07/21 1822) Weight:  [101.5 kg] 101.5 kg (07/22 0500)  Hemodynamic parameters for last 24 hours: PAP: (18-37)/(6-18) 35/16 CO:  [3.4 L/min-5.5 L/min] 5.4 L/min CI:  [1.6 L/min/m2-2.6 L/min/m2] 2.6 L/min/m2  Intake/Output from previous day: 07/21 0701 - 07/22 0700 In: 3805.1 [I.V.:2859; Blood:400; IV Piggyback:546] Out: 4060 [Urine:2940; Blood:800; Chest Tube:320] Intake/Output this shift: No intake/output data recorded.  General appearance: alert, cooperative, and no distress Neurologic: intact Heart: regular rate and rhythm Lungs: diminished breath sounds bibasilar Abdomen: normal findings: soft, non-tender Left hand neuro intact with brisk cap refill  Lab Results: Recent Labs    04/25/21 2200 04/26/21 0407  WBC 10.8* 11.4*  HGB 11.0* 11.1*  HCT 32.2* 33.0*  PLT 142* 142*   BMET:  Recent Labs    04/25/21 2200 04/26/21 0407  NA 135 136  K 3.9 3.8  CL 105 107  CO2 23 22  GLUCOSE 143* 110*  BUN 13 14  CREATININE 0.77 0.79  CALCIUM 7.5* 7.7*    PT/INR:  Recent Labs    04/25/21 1359  LABPROT 18.1*  INR 1.5*   ABG    Component Value Date/Time   PHART 7.363 04/25/2021 1836   HCO3 24.4 04/25/2021 1836    TCO2 26 04/25/2021 1836   ACIDBASEDEF 1.0 04/25/2021 1836   O2SAT 96.0 04/25/2021 1836   CBG (last 3)  Recent Labs    04/26/21 0408 04/26/21 0615 04/26/21 0703  GLUCAP 119* 103* 116*    Assessment/Plan: S/P Procedure(s) (LRB): CORONARY ARTERY BYPASS GRAFTING (CABG)x 3 ON CARDIOPULMONARY BYPASS USING LIMA, LEFT RADIAL ARTERY AND  GSV. (N/A) RADIAL ARTERY HARVEST (Left) TRANSESOPHAGEAL ECHOCARDIOGRAM (TEE) (N/A) ENDOVEIN HARVEST OF GREATER SAPHENOUS VEIN (Bilateral) APPLICATION OF CELL SAVER POD # 1 NEURO- intact  CV- in SR with good hemodynamics- Dc Swan and A line  SR in 70s under pacer- change to backup at 50  Start Imdur for radial graft, dc NTG  ASA, statin, beta blocker  RESP- IS for basilar atelectasis  RENAL- creatinine stable, lytes Ok  Diurese for total body volume overload as expected  ENDO- CBG well controlled  Transition to levemir + SSI  Restart PO meds tomorrow  GI- advance diet as tolerated  HEME- Anemia secondary to ABL- mild, follow  Dc chest tubes  Cardiac rehab    LOS: 2 days    Loreli Slot 04/26/2021

## 2021-04-26 NOTE — Progress Notes (Signed)
Patient ID: Barry Schmidt, male   DOB: Sep 17, 1961, 60 y.o.   MRN: [id number] TCTS Evening Rounds:  Hemodynamically stable in sinus rhythm   Urine output good.  Pm labs pending.  No problems today.

## 2021-04-27 ENCOUNTER — Inpatient Hospital Stay (HOSPITAL_COMMUNITY): Payer: BC Managed Care – PPO

## 2021-04-27 LAB — BASIC METABOLIC PANEL
Anion gap: 5 (ref 5–15)
BUN: 25 mg/dL — ABNORMAL HIGH (ref 6–20)
CO2: 27 mmol/L (ref 22–32)
Calcium: 8.3 mg/dL — ABNORMAL LOW (ref 8.9–10.3)
Chloride: 102 mmol/L (ref 98–111)
Creatinine, Ser: 1.06 mg/dL (ref 0.61–1.24)
GFR, Estimated: >60 mL/min (ref >60)
Glucose, Bld: 170 mg/dL — ABNORMAL HIGH (ref 70–99)
Potassium: 4.1 mmol/L (ref 3.5–5.1)
Sodium: 134 mmol/L — ABNORMAL LOW (ref 135–145)

## 2021-04-27 LAB — CBC
HCT: 29.7 % — ABNORMAL LOW (ref 39.0–52.0)
Hemoglobin: 9.5 g/dL — ABNORMAL LOW (ref 13.0–17.0)
MCH: 28.6 pg (ref 26.0–34.0)
MCHC: 32 g/dL (ref 30.0–36.0)
MCV: 89.5 fL (ref 80.0–100.0)
Platelets: 132 10*3/uL — ABNORMAL LOW (ref 150–400)
RBC: 3.32 MIL/uL — ABNORMAL LOW (ref 4.22–5.81)
RDW: 13.9 % (ref 11.5–15.5)
WBC: 7.7 10*3/uL (ref 4.0–10.5)
nRBC: 0 % (ref 0.0–0.2)

## 2021-04-27 LAB — GLUCOSE, CAPILLARY
Glucose-Capillary: 142 mg/dL — ABNORMAL HIGH (ref 70–99)
Glucose-Capillary: 147 mg/dL — ABNORMAL HIGH (ref 70–99)
Glucose-Capillary: 151 mg/dL — ABNORMAL HIGH (ref 70–99)
Glucose-Capillary: 157 mg/dL — ABNORMAL HIGH (ref 70–99)
Glucose-Capillary: 169 mg/dL — ABNORMAL HIGH (ref 70–99)
Glucose-Capillary: 176 mg/dL — ABNORMAL HIGH (ref 70–99)

## 2021-04-27 MED ORDER — FUROSEMIDE 10 MG/ML IJ SOLN
40.0000 mg | Freq: Once | INTRAMUSCULAR | Status: AC
  Administered 2021-04-27: 40 mg via INTRAVENOUS
  Filled 2021-04-27: qty 4

## 2021-04-27 MED ORDER — POTASSIUM CHLORIDE CRYS ER 20 MEQ PO TBCR
40.0000 meq | EXTENDED_RELEASE_TABLET | Freq: Once | ORAL | Status: AC
  Administered 2021-04-27: 40 meq via ORAL
  Filled 2021-04-27: qty 2

## 2021-04-27 MED ORDER — INSULIN ASPART 100 UNIT/ML IJ SOLN
0.0000 [IU] | Freq: Three times a day (TID) | INTRAMUSCULAR | Status: DC
  Administered 2021-04-27 (×2): 2 [IU] via SUBCUTANEOUS

## 2021-04-27 NOTE — Progress Notes (Signed)
2 Days Post-Op Procedure(s) (LRB): CORONARY ARTERY BYPASS GRAFTING (CABG)x 3 ON CARDIOPULMONARY BYPASS USING LIMA, LEFT RADIAL ARTERY AND  GSV. (N/A) RADIAL ARTERY HARVEST (Left) TRANSESOPHAGEAL ECHOCARDIOGRAM (TEE) (N/A) ENDOVEIN HARVEST OF GREATER SAPHENOUS VEIN (Bilateral) APPLICATION OF CELL SAVER Subjective: Feels better today. Pain under control  Objective: Vital signs in last 24 hours: Temp:  [97.8 F (36.6 C)-98.3 F (36.8 C)] 98.3 F (36.8 C) (07/23 0841) Pulse Rate:  [62-77] 73 (07/23 1005) Cardiac Rhythm: Normal sinus rhythm (07/23 0400) Resp:  [6-30] 10 (07/23 0500) BP: (83-126)/(55-71) 126/69 (07/23 1005) SpO2:  [89 %-99 %] 97 % (07/23 0500) Weight:  [100.7 kg] 100.7 kg (07/23 0500)  Hemodynamic parameters for last 24 hours:    Intake/Output from previous day: 07/22 0701 - 07/23 0700 In: 571.4 [I.V.:311.3; IV Piggyback:260.2] Out: 925 [Urine:845; Chest Tube:80] Intake/Output this shift: No intake/output data recorded.  General appearance: alert and cooperative Neurologic: intact Heart: regular rate and rhythm, S1, S2 normal, no murmur Lungs: clear to auscultation bilaterally Extremities: edema mild Wound: dressing dry  Lab Results: Recent Labs    04/26/21 1659 04/27/21 0351  WBC 12.6* 7.7  HGB 11.1* 9.5*  HCT 33.5* 29.7*  PLT 158 132*   BMET:  Recent Labs    04/26/21 1659 04/27/21 0351  NA 133* 134*  K 5.8* 4.1  CL 103 102  CO2 24 27  GLUCOSE 187* 170*  BUN 20 25*  CREATININE 1.05 1.06  CALCIUM 8.0* 8.3*    PT/INR:  Recent Labs    04/25/21 1359  LABPROT 18.1*  INR 1.5*   ABG    Component Value Date/Time   PHART 7.363 04/25/2021 1836   HCO3 24.4 04/25/2021 1836   TCO2 26 04/25/2021 1836   ACIDBASEDEF 1.0 04/25/2021 1836   O2SAT 96.0 04/25/2021 1836   CBG (last 3)  Recent Labs    04/27/21 0000 04/27/21 0345 04/27/21 0818  GLUCAP 169* 157* 147*   CXR: mild bibasilar atelectasis  Assessment/Plan: S/P Procedure(s)  (LRB): CORONARY ARTERY BYPASS GRAFTING (CABG)x 3 ON CARDIOPULMONARY BYPASS USING LIMA, LEFT RADIAL ARTERY AND  GSV. (N/A) RADIAL ARTERY HARVEST (Left) TRANSESOPHAGEAL ECHOCARDIOGRAM (TEE) (N/A) ENDOVEIN HARVEST OF GREATER SAPHENOUS VEIN (Bilateral) APPLICATION OF CELL SAVER  POD 2 Hemodynamically stable in sinus rhythm. Continue low dose Lopressor.  DM: glucose under good control on Levemir and SSI.  Volume excess: Wt is 7.5 lbs over preop if accurate. Start diuresis  DC sleeve  Imdur for radial artery graft  Transfer to 4E and continue IS, ambulation.   LOS: 3 days    Alleen Borne 04/27/2021

## 2021-04-28 LAB — BASIC METABOLIC PANEL
Anion gap: 8 (ref 5–15)
BUN: 24 mg/dL — ABNORMAL HIGH (ref 6–20)
CO2: 24 mmol/L (ref 22–32)
Calcium: 8.1 mg/dL — ABNORMAL LOW (ref 8.9–10.3)
Chloride: 102 mmol/L (ref 98–111)
Creatinine, Ser: 0.99 mg/dL (ref 0.61–1.24)
GFR, Estimated: >60 mL/min (ref >60)
Glucose, Bld: 145 mg/dL — ABNORMAL HIGH (ref 70–99)
Potassium: 3.4 mmol/L — ABNORMAL LOW (ref 3.5–5.1)
Sodium: 134 mmol/L — ABNORMAL LOW (ref 135–145)

## 2021-04-28 LAB — GLUCOSE, CAPILLARY
Glucose-Capillary: 118 mg/dL — ABNORMAL HIGH (ref 70–99)
Glucose-Capillary: 242 mg/dL — ABNORMAL HIGH (ref 70–99)
Glucose-Capillary: 160 mg/dL — ABNORMAL HIGH (ref 70–99)
Glucose-Capillary: 159 mg/dL — ABNORMAL HIGH (ref 70–99)

## 2021-04-28 MED ORDER — PANTOPRAZOLE SODIUM 40 MG PO TBEC
40.0000 mg | DELAYED_RELEASE_TABLET | Freq: Every day | ORAL | Status: DC
  Administered 2021-04-28 – 2021-05-01 (×4): 40 mg via ORAL
  Filled 2021-04-28 (×4): qty 1

## 2021-04-28 MED ORDER — ONDANSETRON HCL 4 MG/2ML IJ SOLN: 4.0000 mg | Freq: Four times a day (QID) | INTRAMUSCULAR | Status: DC | PRN

## 2021-04-28 MED ORDER — INSULIN ASPART 100 UNIT/ML IJ SOLN
0.0000 [IU] | Freq: Three times a day (TID) | INTRAMUSCULAR | Status: DC
  Administered 2021-04-28: 8 [IU] via SUBCUTANEOUS
  Administered 2021-04-28 – 2021-04-29 (×3): 2 [IU] via SUBCUTANEOUS
  Administered 2021-04-29: 8 [IU] via SUBCUTANEOUS
  Administered 2021-04-29: 2 [IU] via SUBCUTANEOUS
  Administered 2021-04-30: 8 [IU] via SUBCUTANEOUS
  Administered 2021-04-30: 2 [IU] via SUBCUTANEOUS
  Administered 2021-04-30: 4 [IU] via SUBCUTANEOUS

## 2021-04-28 MED ORDER — ASPIRIN EC 325 MG PO TBEC
325.0000 mg | DELAYED_RELEASE_TABLET | Freq: Every day | ORAL | Status: DC
  Administered 2021-04-28 – 2021-05-01 (×4): 325 mg via ORAL
  Filled 2021-04-28 (×4): qty 1

## 2021-04-28 MED ORDER — SODIUM CHLORIDE 0.9% FLUSH: 3.0000 mL | INTRAVENOUS | Status: DC | PRN

## 2021-04-28 MED ORDER — METOPROLOL TARTRATE 12.5 MG HALF TABLET
12.5000 mg | ORAL_TABLET | Freq: Two times a day (BID) | ORAL | Status: DC
  Administered 2021-04-28 (×2): 12.5 mg via ORAL
  Filled 2021-04-28 (×2): qty 1

## 2021-04-28 MED ORDER — SODIUM CHLORIDE 0.9 % IV SOLN: 250.0000 mL | INTRAVENOUS | Status: DC | PRN

## 2021-04-28 MED ORDER — TRAMADOL HCL 50 MG PO TABS
50.0000 mg | ORAL_TABLET | ORAL | Status: DC | PRN
  Administered 2021-04-29: 100 mg via ORAL
  Filled 2021-04-28: qty 2

## 2021-04-28 MED ORDER — SENNOSIDES-DOCUSATE SODIUM 8.6-50 MG PO TABS
1.0000 | ORAL_TABLET | Freq: Two times a day (BID) | ORAL | Status: DC | PRN
  Administered 2021-04-28 – 2021-04-29 (×3): 1 via ORAL
  Filled 2021-04-28 (×3): qty 1

## 2021-04-28 MED ORDER — OXYCODONE HCL 5 MG PO TABS
5.0000 mg | ORAL_TABLET | ORAL | Status: DC | PRN
  Administered 2021-04-28: 5 mg via ORAL
  Administered 2021-04-29: 10 mg via ORAL
  Administered 2021-04-30: 5 mg via ORAL
  Administered 2021-05-01: 10 mg via ORAL
  Filled 2021-04-28 (×2): qty 1
  Filled 2021-04-28 (×2): qty 2

## 2021-04-28 MED ORDER — ONDANSETRON HCL 4 MG PO TABS: 4.0000 mg | ORAL_TABLET | Freq: Four times a day (QID) | ORAL | Status: DC | PRN

## 2021-04-28 MED ORDER — POTASSIUM CHLORIDE CRYS ER 20 MEQ PO TBCR
20.0000 meq | EXTENDED_RELEASE_TABLET | Freq: Every day | ORAL | Status: AC
  Administered 2021-04-28 – 2021-04-30 (×3): 20 meq via ORAL
  Filled 2021-04-28 (×3): qty 1

## 2021-04-28 MED ORDER — ~~LOC~~ CARDIAC SURGERY, PATIENT & FAMILY EDUCATION: Freq: Once | Status: DC

## 2021-04-28 MED ORDER — FUROSEMIDE 40 MG PO TABS
40.0000 mg | ORAL_TABLET | Freq: Every day | ORAL | Status: DC
  Administered 2021-04-28 – 2021-04-29 (×2): 40 mg via ORAL
  Filled 2021-04-28 (×2): qty 1

## 2021-04-28 MED ORDER — SODIUM CHLORIDE 0.9% FLUSH
3.0000 mL | Freq: Two times a day (BID) | INTRAVENOUS | Status: DC
  Administered 2021-04-28 – 2021-04-30 (×5): 3 mL via INTRAVENOUS

## 2021-04-28 MED ORDER — ACETAMINOPHEN 325 MG PO TABS
650.0000 mg | ORAL_TABLET | Freq: Four times a day (QID) | ORAL | Status: DC | PRN
  Administered 2021-04-28 – 2021-04-30 (×4): 650 mg via ORAL
  Filled 2021-04-28 (×4): qty 2

## 2021-04-28 NOTE — Progress Notes (Addendum)
      [address]       [place] [address]             [phone]      3 Days Post-Op Procedure(s) (LRB): CORONARY ARTERY BYPASS GRAFTING (CABG)x 3 ON CARDIOPULMONARY BYPASS USING LIMA, LEFT RADIAL ARTERY AND  GSV. (N/A) RADIAL ARTERY HARVEST (Left) TRANSESOPHAGEAL ECHOCARDIOGRAM (TEE) (N/A) ENDOVEIN HARVEST OF GREATER SAPHENOUS VEIN (Bilateral) APPLICATION OF CELL SAVER Subjective: Feels okay this morning, removed his sternal dressing and sternal incision healing well.   Objective: Vital signs in last 24 hours: Temp:  [98.3 F (36.8 C)-100.2 F (37.9 C)] 98.8 F (37.1 C) (07/24 0306) Pulse Rate:  [69-105] 79 (07/24 0306) Cardiac Rhythm: Sinus tachycardia;Bundle branch block (07/23 1900) Resp:  [11-20] 11 (07/24 0306) BP: (117-147)/(57-73) 119/61 (07/24 0306) SpO2:  [92 %-99 %] 92 % (07/24 0306) Weight:  [99.1 kg] 99.1 kg (07/24 0306)  Hemodynamic parameters for last 24 hours:    Intake/Output from previous day: No intake/output data recorded. Intake/Output this shift: No intake/output data recorded.  General appearance: alert, cooperative, and no distress Heart: regular rate and rhythm, S1, S2 normal, no murmur, click, rub or gallop Lungs: clear to auscultation bilaterally Abdomen: soft, non-tender; bowel sounds normal; no masses,  no organomegaly Extremities: extremities normal, atraumatic, no cyanosis or edema Wound: clean and dry  Lab Results: Recent Labs    04/26/21 1659 04/27/21 0351  WBC 12.6* 7.7  HGB 11.1* 9.5*  HCT 33.5* 29.7*  PLT 158 132*   BMET:  Recent Labs    04/26/21 1659 04/27/21 0351  NA 133* 134*  K 5.8* 4.1  CL 103 102  CO2 24 27  GLUCOSE 187* 170*  BUN 20 25*  CREATININE 1.05 1.06  CALCIUM 8.0* 8.3*    PT/INR:  Recent Labs    04/25/21 1359  LABPROT 18.1*  INR 1.5*   ABG    Component Value Date/Time   PHART 7.363 04/25/2021 1836   HCO3 24.4 04/25/2021 1836   TCO2 26 04/25/2021 1836   ACIDBASEDEF  1.0 04/25/2021 1836   O2SAT 96.0 04/25/2021 1836   CBG (last 3)  Recent Labs    04/27/21 1632 04/27/21 1940 04/28/21 0618  GLUCAP 142* 176* 118*    Assessment/Plan: S/P Procedure(s) (LRB): CORONARY ARTERY BYPASS GRAFTING (CABG)x 3 ON CARDIOPULMONARY BYPASS USING LIMA, LEFT RADIAL ARTERY AND  GSV. (N/A) RADIAL ARTERY HARVEST (Left) TRANSESOPHAGEAL ECHOCARDIOGRAM (TEE) (N/A) ENDOVEIN HARVEST OF GREATER SAPHENOUS VEIN (Bilateral) APPLICATION OF CELL SAVER  CV-NSR in the 70s, BP well controlled. Continue asa, BB, and statin Pulm-tolerating room air with good oxygen saturation Creatinine 1.06, electrolytes okay. Remains on lasix for fluid overload. Wt is up 2kg H and H 9.5/29.7, expected acute blood loss anemia Blood glucose well controlled, continue SSI coverage  Plan: Progressing well POD 3, will leave EPW in one more day. Encouraged to ambulate in the halls and use incentive spirometer, continue diuretic regimen for fluid overload.    LOS: 4 days    Sharlene Dory 04/28/2021   Chart reviewed, patient examined, agree with above. He looks good overall and probably be ready to go home by Wednesday.

## 2021-04-28 NOTE — Progress Notes (Signed)
Mobility Specialist: Progress Note   04/28/21 1122  Mobility  Activity Ambulated in hall  Level of Assistance Independent  Assistive Device None  Distance Ambulated (ft) 470 ft  Mobility Ambulated independently in hallway  Mobility Response Tolerated well  Mobility performed by Mobility specialist  $Mobility charge 1 Mobility   Pre-Mobility: 92 HR Post-Mobility: 96 HR  Pt c/o tightness at his incision sites, otherwise asx. Pt back to bed after walk with minA to get his legs into the bed. Pt has call bell at his side with RN and family member present in the room.   [facility] Lajoyce Tamura Mobility Specialist Mobility Specialist Phone: [phone]

## 2021-04-29 ENCOUNTER — Telehealth: Payer: Self-pay

## 2021-04-29 LAB — GLUCOSE, CAPILLARY
Glucose-Capillary: 138 mg/dL — ABNORMAL HIGH (ref 70–99)
Glucose-Capillary: 130 mg/dL — ABNORMAL HIGH (ref 70–99)
Glucose-Capillary: 241 mg/dL — ABNORMAL HIGH (ref 70–99)
Glucose-Capillary: 119 mg/dL — ABNORMAL HIGH (ref 70–99)
Glucose-Capillary: 151 mg/dL — ABNORMAL HIGH (ref 70–99)

## 2021-04-29 MED ORDER — METOPROLOL TARTRATE 25 MG PO TABS
25.0000 mg | ORAL_TABLET | Freq: Two times a day (BID) | ORAL | Status: DC
  Administered 2021-04-29 – 2021-05-01 (×5): 25 mg via ORAL
  Filled 2021-04-29 (×5): qty 1

## 2021-04-29 MED ORDER — DAPAGLIFLOZIN PROPANEDIOL 10 MG PO TABS
10.0000 mg | ORAL_TABLET | Freq: Every day | ORAL | Status: DC
  Administered 2021-04-30 – 2021-05-01 (×2): 10 mg via ORAL
  Filled 2021-04-29 (×2): qty 1

## 2021-04-29 MED ORDER — GUAIFENESIN ER 600 MG PO TB12
1200.0000 mg | ORAL_TABLET | Freq: Two times a day (BID) | ORAL | Status: DC | PRN
  Administered 2021-04-29 – 2021-04-30 (×3): 1200 mg via ORAL
  Filled 2021-04-29 (×3): qty 2

## 2021-04-29 MED ORDER — METFORMIN HCL 500 MG PO TABS
1000.0000 mg | ORAL_TABLET | Freq: Two times a day (BID) | ORAL | Status: DC
  Administered 2021-04-29 – 2021-05-01 (×4): 1000 mg via ORAL
  Filled 2021-04-29 (×4): qty 2

## 2021-04-29 NOTE — Progress Notes (Signed)
Epicardial Pacing Wires discontinues without difficulty.  Wires intact, no drainage at insertion site.  Vital signs per protocol.  Bed rest initiated x 1 hour.  Patient teaching completed.

## 2021-04-29 NOTE — Progress Notes (Addendum)
      [address]       [place] [address]             [phone]    4 Days Post-Op Procedure(s) (LRB): CORONARY ARTERY BYPASS GRAFTING (CABG)x 3 ON CARDIOPULMONARY BYPASS USING LIMA, LEFT RADIAL ARTERY AND  GSV. (N/A) RADIAL ARTERY HARVEST (Left) TRANSESOPHAGEAL ECHOCARDIOGRAM (TEE) (N/A) ENDOVEIN HARVEST OF GREATER SAPHENOUS VEIN (Bilateral) APPLICATION OF CELL SAVER  Subjective:  Patient didn't sleep well last night.  He is also complaining of difficulty coughing up sputum.  He is ambulating.  Has not yet moved his bowels, but is passing gas  Objective: Vital signs in last 24 hours: Temp:  [98.6 F (37 C)-100.1 F (37.8 C)] 98.6 F (37 C) (07/25 0317) Pulse Rate:  [78-96] 94 (07/25 0317) Cardiac Rhythm: Normal sinus rhythm (07/24 1900) Resp:  [15-20] 15 (07/25 0317) BP: (111-142)/(59-70) 128/59 (07/25 0317) SpO2:  [92 %-94 %] 92 % (07/25 0317) Weight:  [97.3 kg] 97.3 kg (07/25 0500)  Intake/Output from previous day: 07/24 0701 - 07/25 0700 In: 240 [P.O.:240] Out: 2220 [Urine:2220] Intake/Output this shift: No intake/output data recorded.  General appearance: alert, cooperative, and no distress Heart: regular rate and rhythm Lungs: clear to auscultation bilaterally Abdomen: soft, non-tender; bowel sounds normal; no masses,  no organomegaly Extremities: edema trace Wound: clean and dry, no paraesthesias in  his left arm  Lab Results: Recent Labs    04/26/21 1659 04/27/21 0351  WBC 12.6* 7.7  HGB 11.1* 9.5*  HCT 33.5* 29.7*  PLT 158 132*   BMET:  Recent Labs    04/27/21 0351 04/28/21 0756  NA 134* 134*  K 4.1 3.4*  CL 102 102  CO2 27 24  GLUCOSE 170* 145*  BUN 25* 24*  CREATININE 1.06 0.99  CALCIUM 8.3* 8.1*    PT/INR: No results for input(s): LABPROT, INR in the last 72 hours. ABG    Component Value Date/Time   PHART 7.363 04/25/2021 1836   HCO3 24.4 04/25/2021 1836   TCO2 26 04/25/2021 1836   ACIDBASEDEF 1.0 04/25/2021  1836   O2SAT 96.0 04/25/2021 1836   CBG (last 3)  Recent Labs    04/28/21 2049 04/29/21 0620 04/29/21 0645  GLUCAP 159* 138* 130*    Assessment/Plan: S/P Procedure(s) (LRB): CORONARY ARTERY BYPASS GRAFTING (CABG)x 3 ON CARDIOPULMONARY BYPASS USING LIMA, LEFT RADIAL ARTERY AND  GSV. (N/A) RADIAL ARTERY HARVEST (Left) TRANSESOPHAGEAL ECHOCARDIOGRAM (TEE) (N/A) ENDOVEIN HARVEST OF GREATER SAPHENOUS VEIN (Bilateral) APPLICATION OF CELL SAVER  CV- brief episodes of tachycardia overnight, currently in NSR- will increase Lopressor to 25 mg BID, Imdur for radial artery, will d/c EPW Pulm- no acute issues, difficulty expectorating sputum, will add Mucinex Renal- creatinine has been stable, weight is trending down, K was at 3.4 yesterday, supplementation ordered... no BMET was ordered for today, will repeat labs in AM DM- sugars controlled, will continue SSIP, restart Metformin, Farxiga at time of discharge Dispo- patient stable, titrate BB for additional HR and BP control, Mucinex for cough, continue diuretics, repeat BMET in AM.. if remains stable, possibly for d/c in next 24-48 hours   LOS: 5 days    Lowella Dandy, PA-C 04/29/2021 Patient seen and examined, agree with above Incisions look great Feeling a little better Restart PO diabetes meds Possibly home in AM  [place] C. Dorris Fetch, MD [facility] [phone]

## 2021-04-29 NOTE — Progress Notes (Signed)
Mobility Specialist: Progress Note   04/29/21 1231  Mobility  Activity Ambulated in hall  Level of Assistance Independent  Assistive Device None  Distance Ambulated (ft) 470 ft  Mobility Ambulated with assistance in hallway  Mobility Response Tolerated well  Mobility performed by Mobility specialist  $Mobility charge 1 Mobility   Pre-Mobility: 76 HR, 106/56 BP Post-Mobility: 79 HR, 121/71 BP, 97% SpO2  Pt asx throughout ambulation. Pt is sitting EOB after walk with call bell and phone at his side.   [facility] Barry Schmidt Mobility Specialist Mobility Specialist Phone: [phone]

## 2021-04-29 NOTE — Telephone Encounter (Signed)
Patient called in and wanted to just Thank Dr. Mardelle Matte for everything and pushing him to  figure out what is going on. Patient is very grateful.

## 2021-04-29 NOTE — Progress Notes (Signed)
CARDIAC REHAB PHASE I   PRE:  Rate/Rhythm: 90 sR    BP: sitting 108/63    SaO2: 90 RA  MODE:  Ambulation: 470 ft   POST:  Rate/Rhythm: 105 ST    BP: sitting 122/65     SaO2: 92 RA  Pt needed mod-max assit to get to EOB due to poor mechanics. After walking, I demonstrated to pt correct mechanics for rolling. Able to stand with mod assist with gait belt and walked with min assist. Steady, SOB with distance, HR up. To recliner. Encouraged more recliner time today, IS. Set up d/c video for pt. [id number]  Harriet Masson CES, ACSM 04/29/2021 9:57 AM

## 2021-04-30 DIAGNOSIS — I951 Orthostatic hypotension: Secondary | ICD-10-CM

## 2021-04-30 DIAGNOSIS — I25700 Atherosclerosis of coronary artery bypass graft(s), unspecified, with unstable angina pectoris: Secondary | ICD-10-CM | POA: Diagnosis not present

## 2021-04-30 DIAGNOSIS — E7800 Pure hypercholesterolemia, unspecified: Secondary | ICD-10-CM

## 2021-04-30 DIAGNOSIS — I208 Other forms of angina pectoris: Secondary | ICD-10-CM | POA: Diagnosis not present

## 2021-04-30 LAB — BASIC METABOLIC PANEL
Anion gap: 8 (ref 5–15)
BUN: 20 mg/dL (ref 6–20)
CO2: 27 mmol/L (ref 22–32)
Calcium: 8.1 mg/dL — ABNORMAL LOW (ref 8.9–10.3)
Chloride: 100 mmol/L (ref 98–111)
Creatinine, Ser: 0.89 mg/dL (ref 0.61–1.24)
GFR, Estimated: >60 mL/min (ref >60)
Glucose, Bld: 145 mg/dL — ABNORMAL HIGH (ref 70–99)
Potassium: 3 mmol/L — ABNORMAL LOW (ref 3.5–5.1)
Sodium: 135 mmol/L (ref 135–145)

## 2021-04-30 LAB — GLUCOSE, CAPILLARY
Glucose-Capillary: 146 mg/dL — ABNORMAL HIGH (ref 70–99)
Glucose-Capillary: 227 mg/dL — ABNORMAL HIGH (ref 70–99)
Glucose-Capillary: 75 mg/dL (ref 70–99)
Glucose-Capillary: 165 mg/dL — ABNORMAL HIGH (ref 70–99)

## 2021-04-30 MED ORDER — ISOSORBIDE MONONITRATE ER 30 MG PO TB24
15.0000 mg | ORAL_TABLET | Freq: Every day | ORAL | Status: DC
  Administered 2021-04-30 – 2021-05-01 (×2): 15 mg via ORAL
  Filled 2021-04-30 (×2): qty 1

## 2021-04-30 MED ORDER — POTASSIUM CHLORIDE CRYS ER 20 MEQ PO TBCR
40.0000 meq | EXTENDED_RELEASE_TABLET | Freq: Once | ORAL | Status: AC
  Administered 2021-04-30: 40 meq via ORAL
  Filled 2021-04-30: qty 2

## 2021-04-30 NOTE — Progress Notes (Signed)
      [address]       [name] [address]             [phone]       Barry Schmidt remains stable.  His blood pressure has improved and is maintaining around 100.  He has ambulated without difficulty and doesn't have any dizziness.  His AM dose of Imdur was held.  Per cardiology recs will hold Lopressor for now and give Imdur this evening.  Patient and his wife wish to be observed overnight to ensure BP is well controlled and his potassium level is corrected.  This is reasonable.  Will monitor BP today repeat BMET in AM.. If patient remains stable, will d/c in AM  Rossanna Spitzley, PA-C

## 2021-04-30 NOTE — Progress Notes (Addendum)
[address]       [place] [address]             [phone]      5 Days Post-Op Procedure(s) (LRB): CORONARY ARTERY BYPASS GRAFTING (CABG)x 3 ON CARDIOPULMONARY BYPASS USING LIMA, LEFT RADIAL ARTERY AND  GSV. (N/A) RADIAL ARTERY HARVEST (Left) TRANSESOPHAGEAL ECHOCARDIOGRAM (TEE) (N/A) ENDOVEIN HARVEST OF GREATER SAPHENOUS VEIN (Bilateral) APPLICATION OF CELL SAVER  Subjective:  Patient doing well.  He slept a little better last night, but still didn't get a full night of sleep.  He is coughing which is causing him discomfort.  He was able to move his bowels yesterday  Objective: Vital signs in last 24 hours: Temp:  [98.2 F (36.8 C)-98.9 F (37.2 C)] 98.8 F (37.1 C) (07/26 0400) Pulse Rate:  [69-91] 71 (07/26 0400) Cardiac Rhythm: Normal sinus rhythm (07/26 0200) Resp:  [16-22] 18 (07/26 0400) BP: (90-135)/(58-71) 90/64 (07/26 0400) SpO2:  [92 %-98 %] 93 % (07/26 0400) Weight:  [96.3 kg] 96.3 kg (07/26 0358)  General appearance: alert, cooperative, and no distress Heart: regular rate and rhythm Lungs: clear to auscultation bilaterally Abdomen: soft, non-tender; bowel sounds normal; no masses,  no organomegaly Extremities: edema none present Wound: clean and dry, no paraesthesias at radial artery harvest site on left  Lab Results: No results for input(s): WBC, HGB, HCT, PLT in the last 72 hours. BMET:  Recent Labs    04/28/21 0756 04/30/21 0151  NA 134* 135  K 3.4* 3.0*  CL 102 100  CO2 24 27  GLUCOSE 145* 145*  BUN 24* 20  CREATININE 0.99 0.89  CALCIUM 8.1* 8.1*    PT/INR: No results for input(s): LABPROT, INR in the last 72 hours. ABG    Component Value Date/Time   PHART 7.363 04/25/2021 1836   HCO3 24.4 04/25/2021 1836   TCO2 26 04/25/2021 1836   ACIDBASEDEF 1.0 04/25/2021 1836   O2SAT 96.0 04/25/2021 1836   CBG (last 3)  Recent Labs    04/29/21 1808 04/29/21 2106 04/30/21 0617  GLUCAP 119* 151* 146*     Assessment/Plan: S/P Procedure(s) (LRB): CORONARY ARTERY BYPASS GRAFTING (CABG)x 3 ON CARDIOPULMONARY BYPASS USING LIMA, LEFT RADIAL ARTERY AND  GSV. (N/A) RADIAL ARTERY HARVEST (Left) TRANSESOPHAGEAL ECHOCARDIOGRAM (TEE) (N/A) ENDOVEIN HARVEST OF GREATER SAPHENOUS VEIN (Bilateral) APPLICATION OF CELL SAVER  CV- NSR, hypotension- HR improved with increase dose of BB, remains on Imdur at 30 mg for radial artery graft... SBP is running 90s-100s- will decrease Imdur to see if we can get BP increased.. need to monitor with addition of Marcelline Deist which can also decrease his BP Pulm- no acute issues, off oxygen, + cough, nonproductive Renal- creatinine has been WNL, weight is 2 lbs above baseline will stop Lasix in setting of hypotension Hypokalemia- level is 3.0 this morning, will give additional 40 meq potassium DM- sugars stable, home Metformin and Farxiga have been reordered Dispo- patient doing well, hypotension noted, medications adjusted, hypokalemic at 3.0, supplement ordered, patient feels up to going home, however in setting of hypotension and low potassium may benefit from further observation.. will discuss with Dr. Dorris Fetch    LOS: 6 days    Lowella Dandy, PA-C 04/30/2021 Patient seen and examined, agree with above Looks great but BP a little low this AM, likely combination of multiple meds and diuresis Agree with plan as outlined above Possibly home later today if BP normalizes  Viviann Spare C. Dorris Fetch, MD [facility] [phone]

## 2021-04-30 NOTE — Progress Notes (Signed)
Orthostatics:  Sitting BP 86/58  P 83   asymptomatic  Standing @ BP 74/63 P 91    asymptomatic    Ambulated x 10 feet asymptomatic

## 2021-04-30 NOTE — Progress Notes (Signed)
Mobility Specialist: Progress Note   04/30/21 1245  Mobility  Activity Ambulated in hall  Level of Assistance Minimal assist, patient does 75% or more  Assistive Device None  Distance Ambulated (ft) 1100 ft  Mobility Ambulated independently in hallway  Mobility Response Tolerated well  Mobility performed by Mobility specialist  $Mobility charge 1 Mobility   Pre-Mobility: 81 HR, 103/64 BP, 93% SpO2 Post-Mobility: 78 HR, 125/65 BP, 94% SpO2  Pt asx during ambulation. Pt back to bed after walk with pt's wife and RN present in the room.   [facility] Chyla Schlender Mobility Specialist Mobility Specialist Phone: [phone]

## 2021-04-30 NOTE — Progress Notes (Signed)
CARDIAC REHAB PHASE I   PRE:  Rate/Rhythm: 86 SR    BP: sitting 105/67    SaO2: 95 RA  MODE:  Ambulation: 940 ft   POST:  Rate/Rhythm: 95 SR    BP: sitting 121/70     SaO2: 99 RA  Pt feeling well. Stood independently and walked independently. No c/o, BP has stabilized. Discussed IS, sternal precautions, diet, exercise, and CRPII with pt and wife (on phone). Very receptive, all questions answered. Will refer to [place] CRPII (closest [facility]).  [phone]   Harriet Masson CES, ACSM 04/30/2021 9:52 AM

## 2021-04-30 NOTE — Progress Notes (Signed)
Progress Note  Patient Name: Barry Schmidt Date of Encounter: 04/30/2021  [facility] Cardiologist: Nicki Guadalajara, MD  Subjective   Denies any chest pain or SOB.  Anxious to go home.  SBP dropped into the 70's this am but he feels fine  Inpatient Medications    Scheduled Meds:  aspirin EC  325 mg Oral Daily   [facility] Patient [facility] Family Education   Does not apply Once   dapagliflozin propanediol  10 mg Oral Daily   enoxaparin (LOVENOX) injection  40 mg Subcutaneous QHS   insulin aspart  0-24 Units Subcutaneous TID AC & HS   isosorbide mononitrate  15 mg Oral Daily   metFORMIN  1,000 mg Oral BID WC   metoprolol tartrate  25 mg Oral BID   mupirocin ointment  1 application Nasal BID   pantoprazole  40 mg Oral QAC breakfast   potassium chloride  20 mEq Oral Daily   potassium chloride  40 mEq Oral Once   rosuvastatin  40 mg Oral Daily   sodium chloride flush  3 mL Intravenous Q12H   Continuous Infusions:  sodium chloride     PRN Meds: sodium chloride, acetaminophen, guaiFENesin, lip balm, LORazepam, ondansetron **OR** ondansetron (ZOFRAN) IV, oxyCODONE, senna-docusate, sodium chloride flush, traMADol   Vital Signs    Vitals:   04/29/21 2328 04/30/21 0358 04/30/21 0400 04/30/21 0750  BP:  90/64 90/64 (!) 86/58  Pulse:  69 71 84  Resp:  20 18 20   Temp: 98.2 F (36.8 C) 98.8 F (37.1 C) 98.8 F (37.1 C) 98.3 F (36.8 C)  TempSrc: Oral Oral Oral Oral  SpO2:  95% 93% 94%  Weight:  96.3 kg    Height:       No intake or output data in the 24 hours ending 04/30/21 0842 Last 3 Weights 04/30/2021 04/29/2021 04/28/2021  Weight (lbs) 212 lb 4.9 oz 214 lb 6.4 oz 218 lb 8 oz  Weight (kg) 96.3 kg 97.251 kg 99.111 kg      Telemetry    NSR - Personally Reviewed  ECG    No new EKG to review - Personally Reviewed  Physical Exam   GEN: No acute distress.   Neck: No JVD Cardiac: RRR, no murmurs, rubs, or gallops.  Respiratory: Clear to auscultation  bilaterally. GI: Soft, nontender, non-distended  MS: No edema; No deformity. Neuro:  Nonfocal  Psych: Normal affect   Labs    High Sensitivity Troponin:  No results for input(s): TROPONINIHS in the last 720 hours.    Chemistry Recent Labs  Lab 04/25/21 2200 04/26/21 0407 04/27/21 0351 04/28/21 0756 04/30/21 0151  NA 135   < > 134* 134* 135  K 3.9   < > 4.1 3.4* 3.0*  CL 105   < > 102 102 100  CO2 23   < > 27 24 27   GLUCOSE 143*   < > 170* 145* 145*  BUN 13   < > 25* 24* 20  CREATININE 0.77   < > 1.06 0.99 0.89  CALCIUM 7.5*   < > 8.3* 8.1* 8.1*  PROT 5.2*  --   --   --   --   ALBUMIN 3.3*  --   --   --   --   AST 30  --   --   --   --   ALT 19  --   --   --   --   ALKPHOS 41  --   --   --   --  BILITOT 0.8  --   --   --   --   GFRNONAA >60   < > >60 >60 >60  ANIONGAP 7   < > 5 8 8    < > = values in this interval not displayed.     Hematology Recent Labs  Lab 04/26/21 0407 04/26/21 1659 04/27/21 0351  WBC 11.4* 12.6* 7.7  RBC 3.85* 3.80* 3.32*  HGB 11.1* 11.1* 9.5*  HCT 33.0* 33.5* 29.7*  MCV 85.7 88.2 89.5  MCH 28.8 29.2 28.6  MCHC 33.6 33.1 32.0  RDW 13.2 13.7 13.9  PLT 142* 158 132*    BNPNo results for input(s): BNP, PROBNP in the last 168 hours.   DDimer No results for input(s): DDIMER in the last 168 hours.    Radiology    No results found.  Cardiac Studies   2D echo 04/2021 IMPRESSIONS    1. Left ventricular ejection fraction, by estimation, is 55 to 60%. The  left ventricle has normal function. The left ventricle has no regional  wall motion abnormalities. Left ventricular diastolic parameters were  normal. There is hypokinesis of the left   ventricular, apical segment.   2. Right ventricular systolic function is normal. The right ventricular  size is normal. Tricuspid regurgitation signal is inadequate for assessing  PA pressure.   3. The mitral valve is normal in structure. Trivial mitral valve  regurgitation. No evidence of  mitral stenosis.   4. The aortic valve is tricuspid. Aortic valve regurgitation is not  visualized. Mild aortic valve sclerosis is present, with no evidence of  aortic valve stenosis.   5. The inferior vena cava is normal in size with <50% respiratory  variability, suggesting right atrial pressure of 8 mmHg.   Patient Profile     60 y.o. male with a positive family history of CAD. He has no prior history of CAD.  He has a history of type II diabetes, dyslipidemia and remote low volume tobacco use. He has about a 3 month history of chest pressure with exertion relieved with rest. No recent rest or nocturnal pain, but did have an episode of nocturnal pain about 18 months ago which led him to quit smoking.   He saw Dr. 41 who recommended cardiac catheterization. 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 EF estimated 35-40% vs 55% by Echocardiogram.  He was admitted for possible bypass evaluation.  Assessment & Plan     ASCAD -presented with several months of CP and found to have LM and 3V CAD with occluded LAD and R>L collaterals -s/p CABG with LIMA>LAD, LRA>OM2 and SVG to PDA -2D echo with normal LVF.  -denies any angina -continue ASA 325mg  daily and high dose statin -Imdur and Lopressor on hold this am due to low BP>>may need to send home only on long acting nitrates for now  2.  HLD -LDL goal < 70 -LDL was 42 in June 2022 -continue Crestor 40mg  daily  3.  Hypotension -had SBPs into the 70's this am and had been trending downward over the past 24 hours.  -he is not really symptomatic sitting in the chair -suspect related to over diuresis with Lasix -no I&O's but weight is down 10lbs from admit -encouraged to increase fluids  4.  Hypokalemia -K+ 3.3 this am due to diuretics -replete per CVTS to keep > 4.   [facility] will sign off.  Medication Recommendations:  ASA 325mg   daily, Crestor 40mg  daily, Imdur 15mg  daily.  Other recommendations (labs, testing, etc):  none Follow up as an outpatient:  Followup with Dr. in 2 weeks  For questions or updates, please contact [facility] Please consult [email or web address] for contact info under        Signed, , MD  04/30/2021, 8:42 AM

## 2021-05-01 LAB — GLUCOSE, CAPILLARY: Glucose-Capillary: 126 mg/dL — ABNORMAL HIGH (ref 70–99)

## 2021-05-01 LAB — BASIC METABOLIC PANEL
Anion gap: 7 (ref 5–15)
BUN: 17 mg/dL (ref 6–20)
CO2: 28 mmol/L (ref 22–32)
Calcium: 8.4 mg/dL — ABNORMAL LOW (ref 8.9–10.3)
Chloride: 104 mmol/L (ref 98–111)
Creatinine, Ser: 0.87 mg/dL (ref 0.61–1.24)
GFR, Estimated: >60 mL/min (ref >60)
Glucose, Bld: 134 mg/dL — ABNORMAL HIGH (ref 70–99)
Potassium: 3.6 mmol/L (ref 3.5–5.1)
Sodium: 139 mmol/L (ref 135–145)

## 2021-05-01 MED ORDER — OXYCODONE HCL 5 MG PO TABS: 5.0000 mg | ORAL_TABLET | ORAL | 0 refills | Status: DC | PRN

## 2021-05-01 MED ORDER — ISOSORBIDE MONONITRATE ER 30 MG PO TB24: 15.0000 mg | ORAL_TABLET | Freq: Every day | ORAL | 0 refills | Status: DC

## 2021-05-01 MED ORDER — GUAIFENESIN ER 600 MG PO TB12: 1200.0000 mg | ORAL_TABLET | Freq: Two times a day (BID) | ORAL | Status: DC | PRN

## 2021-05-01 MED ORDER — ROSUVASTATIN CALCIUM 40 MG PO TABS: 40.0000 mg | ORAL_TABLET | Freq: Every day | ORAL | 3 refills | Status: DC

## 2021-05-01 MED ORDER — METOPROLOL SUCCINATE ER 25 MG PO TB24: 25.0000 mg | ORAL_TABLET | Freq: Every day | ORAL | Status: DC

## 2021-05-01 MED ORDER — POTASSIUM CHLORIDE CRYS ER 20 MEQ PO TBCR
40.0000 meq | EXTENDED_RELEASE_TABLET | Freq: Once | ORAL | Status: AC
  Administered 2021-05-01: 40 meq via ORAL
  Filled 2021-05-01: qty 2

## 2021-05-01 MED ORDER — LIP MEDEX EX OINT: TOPICAL_OINTMENT | CUTANEOUS | 0 refills | Status: DC | PRN

## 2021-05-01 MED ORDER — ACETAMINOPHEN 325 MG PO TABS: 650.0000 mg | ORAL_TABLET | Freq: Four times a day (QID) | ORAL | Status: AC | PRN

## 2021-05-01 NOTE — Progress Notes (Signed)
      [address]       [place] [address]             [phone]      6 Days Post-Op Procedure(s) (LRB): CORONARY ARTERY BYPASS GRAFTING (CABG)x 3 ON CARDIOPULMONARY BYPASS USING LIMA, LEFT RADIAL ARTERY AND  GSV. (N/A) RADIAL ARTERY HARVEST (Left) TRANSESOPHAGEAL ECHOCARDIOGRAM (TEE) (N/A) ENDOVEIN HARVEST OF GREATER SAPHENOUS VEIN (Bilateral) APPLICATION OF CELL SAVER  Subjective:  Patient feeling better.  Is ready to go home.  + ambulation   + BM  Objective: Vital signs in last 24 hours: Temp:  [97.8 F (36.6 C)-99 F (37.2 C)] 99 F (37.2 C) (07/27 0305) Pulse Rate:  [75-92] 75 (07/27 0305) Cardiac Rhythm: Normal sinus rhythm (07/26 2035) Resp:  [13-20] 13 (07/27 0305) BP: (86-125)/(54-66) 119/66 (07/27 0305) SpO2:  [93 %-98 %] 93 % (07/27 0305) Weight:  [95.4 kg] 95.4 kg (07/27 0305)  General appearance: alert, cooperative, and no distress Heart: regular rate and rhythm Lungs: clear to auscultation bilaterally Abdomen: soft, non-tender; bowel sounds normal; no masses,  no organomegaly Extremities: no edema present Wound: clean and dry, left radial site is starting to "wake up"  Lab Results: No results for input(s): WBC, HGB, HCT, PLT in the last 72 hours. BMET:  Recent Labs    04/30/21 0151 05/01/21 0119  NA 135 139  K 3.0* 3.6  CL 100 104  CO2 27 28  GLUCOSE 145* 134*  BUN 20 17  CREATININE 0.89 0.87  CALCIUM 8.1* 8.4*    PT/INR: No results for input(s): LABPROT, INR in the last 72 hours. ABG    Component Value Date/Time   PHART 7.363 04/25/2021 1836   HCO3 24.4 04/25/2021 1836   TCO2 26 04/25/2021 1836   ACIDBASEDEF 1.0 04/25/2021 1836   O2SAT 96.0 04/25/2021 1836   CBG (last 3)  Recent Labs    04/30/21 1621 04/30/21 2104 05/01/21 0534  GLUCAP 75 165* 126*    Assessment/Plan: S/P Procedure(s) (LRB): CORONARY ARTERY BYPASS GRAFTING (CABG)x 3 ON CARDIOPULMONARY BYPASS USING LIMA, LEFT RADIAL ARTERY AND  GSV.  (N/A) RADIAL ARTERY HARVEST (Left) TRANSESOPHAGEAL ECHOCARDIOGRAM (TEE) (N/A) ENDOVEIN HARVEST OF GREATER SAPHENOUS VEIN (Bilateral) APPLICATION OF CELL SAVER  CV- NSR, hypotension resolved, SBP 120 this morning, will continue reduced dose of Imdur, home Toprol XL Pulm- no acute issues, off oxygen, continue IS Renal- creatinine is stable at 0.87, hypokalemia improved to 3.6 DM- sugars are mostly controlled Dispo- patient stable, hypotension resolved, hypokalemia resolved, will d/c patient home today   LOS: 7 days   Lowella Dandy, PA-C 05/01/2021

## 2021-05-01 NOTE — Progress Notes (Signed)
Discharge instructions (including medications) discussed with and copy provided to patient/caregiver 

## 2021-05-01 NOTE — Plan of Care (Signed)
  Problem: Education: Goal: Knowledge of General Education information will improve Description: Including pain rating scale, medication(s)/side effects and non-pharmacologic comfort measures Outcome: Adequate for Discharge   

## 2021-05-01 NOTE — Progress Notes (Signed)
Removed chest tube suture per ordered. Pt tolerated well.   Lawson Radar, RN

## 2021-05-02 MED FILL — Heparin Sodium (Porcine) Inj 1000 Unit/ML: INTRAMUSCULAR | Qty: 10 | Status: AC

## 2021-05-02 MED FILL — Mannitol IV Soln 20%: INTRAVENOUS | Qty: 500 | Status: AC

## 2021-05-02 MED FILL — Lidocaine HCl Local Preservative Free (PF) Inj 2%: INTRAMUSCULAR | Qty: 5 | Status: AC

## 2021-05-02 MED FILL — Magnesium Sulfate Inj 50%: INTRAMUSCULAR | Qty: 10 | Status: AC

## 2021-05-02 MED FILL — Sodium Bicarbonate IV Soln 8.4%: INTRAVENOUS | Qty: 50 | Status: AC

## 2021-05-02 MED FILL — Potassium Chloride Inj 2 mEq/ML: INTRAVENOUS | Qty: 40 | Status: AC

## 2021-05-02 MED FILL — Electrolyte-R (PH 7.4) Solution: INTRAVENOUS | Qty: 4000 | Status: AC

## 2021-05-02 MED FILL — Sodium Chloride IV Soln 0.9%: INTRAVENOUS | Qty: 2000 | Status: AC

## 2021-05-02 MED FILL — Heparin Sodium (Porcine) Inj 1000 Unit/ML: INTRAMUSCULAR | Qty: 30 | Status: AC

## 2021-05-07 ENCOUNTER — Telehealth: Payer: Self-pay

## 2021-05-07 ENCOUNTER — Telehealth (HOSPITAL_COMMUNITY): Payer: Self-pay

## 2021-05-07 NOTE — Telephone Encounter (Signed)
Called and spoke with patient wife [name] to see if pt is interested in the Cardiac Rehab Program. Patient expressed interest. Explained scheduling process and went over insurance, patient verbalized understanding. Will contact patient for scheduling once f/u has been completed.

## 2021-05-07 NOTE — Telephone Encounter (Signed)
Pt insurance is active and benefits verified through [employer]. Co-pay $0.00, DED $5,600.00/$5,007.27 met, out of pocket $5,600.00/$5,600.00 met, co-insurance 20%. No pre-authorization required. Passport, 05/07/21 @ 3:50PM, DIR#[id number]   Will contact patient to see if he is interested in the Cardiac Rehab Program. If interested, patient will need to complete follow up appt. Once completed, patient will be contacted for scheduling upon review by the RN Navigator.

## 2021-05-07 NOTE — Telephone Encounter (Signed)
FMLA/STD form completed and faxed to [facility] @ [phone], beginning leave 04/24/21 through approx 07/22/21

## 2021-05-13 ENCOUNTER — Telehealth (HOSPITAL_COMMUNITY): Payer: Self-pay | Admitting: Family Medicine

## 2021-05-19 NOTE — Progress Notes (Signed)
Cardiology Clinic Note   Patient Name: Barry Schmidt Date of Encounter: 05/20/2021  Primary Care Provider:  [name], MD Primary Cardiologist:  [name], MD  Patient Profile    Barry Schmidt 60 year old male presents the clinic today for follow-up evaluation of his coronary artery disease status post CABG x3 on 04/24/2021  Past Medical History    Past Medical History:  Diagnosis Date   Diabetes mellitus without complication ([place])    On statin therapy due to risk of future cardiovascular event 10/14/2018   Uncontrolled diabetes mellitus without complication, with long-term current use of insulin 06/23/2018   Diagnosed in 2007; was morbidly obese at that time 260 pounds. He lost weight and did well.    Past Surgical History:  Procedure Laterality Date   CORONARY ARTERY BYPASS GRAFT N/A 04/25/2021   Procedure: CORONARY ARTERY BYPASS GRAFTING (CABG)x 3 ON CARDIOPULMONARY BYPASS USING [place], LEFT RADIAL ARTERY AND  GSV.;  Surgeon: Melrose Nakayama, MD;  Location: [place];  Service: Open Heart Surgery;  Laterality: N/A;   ENDOVEIN HARVEST OF GREATER SAPHENOUS VEIN Bilateral 04/25/2021   Procedure: ENDOVEIN HARVEST OF GREATER SAPHENOUS VEIN;  Surgeon: Melrose Nakayama, MD;  Location: [place];  Service: Open Heart Surgery;  Laterality: Bilateral;   LEFT HEART CATH AND CORONARY ANGIOGRAPHY N/A 04/24/2021   Procedure: LEFT HEART CATH AND CORONARY ANGIOGRAPHY;  Surgeon: Troy Sine, MD;  Location: [place];  Service: Cardiovascular;  Laterality: N/A;   RADIAL ARTERY HARVEST Left 04/25/2021   Procedure: RADIAL ARTERY HARVEST;  Surgeon: Melrose Nakayama, MD;  Location: [place];  Service: Open Heart Surgery;  Laterality: Left;   TEE WITHOUT CARDIOVERSION N/A 04/25/2021   Procedure: TRANSESOPHAGEAL ECHOCARDIOGRAM (TEE);  Surgeon: Melrose Nakayama, MD;  Location: [place];  Service: Open Heart Surgery;  Laterality: N/A;    Allergies  Allergies  Allergen Reactions    Penicillins Hives    Reaction: 15-20 years ago    History of Present Illness    Barry Schmidt has a PMH of mixed hyperlipidemia, type 2 diabetes, former tobacco use, and coronary artery disease status post CABG x3 on 04/25/2021.  He is originally from [facility].  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  When he was seen by Dr. Claiborne Billings 04/04/2021 he complained of chest tightness with lifting objects.  He also reported that he had noticed episodes of vertigo and staggering shortly after being vaccinated for COVID.  He underwent head MRI.  He also noted increased shortness of breath with mask wearing for the last year.  He also noted periods of chest tightness when he would walk and carry objects.  He had also attended the [place] [employer] and felt chest tightness while walking in the [place] while carrying a cooler.  It was referred to CVTS and underwent CABG x3 on 04/25/2021.  He tolerated his surgery well and progressed well.  His lines and tubes were removed without difficulty.  He was noted to have a brief episode of tachycardia which was treated with increased Lopressor dosing.  His cardiac pacing wires were removed without difficulty.  He did complain of some difficulty expectorating phlegm/sputum and was started on Mucinex.  He developed hypokalemia and his potassium supplementation was increased.  He was noted to have hypotension and his Imdur was decreased.  His hypotension resolved.  He continued to progress in his physical activity without difficulty and was discharged in stable condition on 05/01/2020.  He presents  to the clinic today for follow-up evaluation states he has slowly been progressing his physical activity.  His wife presents with him and reports that he is having some anxiety with getting in and out of the shower.  He has been tolerating the 1-1/2 steps coming out of their garage fairly well.  His weight has been stable around 205 206 pounds.  His incisions are  all clean dry intact well approximated and healing well.  His blood pressure is well controlled at 110/64.  He has noted continual headaches with metoprolol succinate.  I will change his beta-blocker to metoprolol tartrate 25 mg twice daily.  I have asked him to continue to increase his physical activity, perform all of his own ADLs, begin lower extremity exercises will Thera-Band, increase his walking, and given permission for him to start going up and down stairs.  We will continue his heart healthy low-sodium diet, daily weights, and have him follow-up in 3 months.  He has noticed some increased work of breathing with increased physical activity.  I have asked him to practice slow deep breathing.  Today he will denies chest pain, shortness of breath, lower extremity edema, fatigue, palpitations, melena, hematuria, hemoptysis, diaphoresis, weakness, presyncope, syncope, orthopnea, and PND.   Home Medications    Prior to Admission medications   Medication Sig Start Date End Date Taking? Authorizing Provider  ACCU-CHEK AVIVA PLUS test strip USE AS INSTRUCTED TO TEST BLOOD GLUCOSE 08/25/18   [name], MD  acetaminophen (TYLENOL) 325 MG tablet Take 2 tablets (650 mg total) by mouth every 6 (six) hours as needed for mild pain. 05/01/21   Barrett, Lodema Hong, PA-C  aspirin EC 81 MG tablet Take 81 mg by mouth at bedtime.    [provider]  Blood Glucose Monitoring Suppl (ONE TOUCH ULTRA 2) w/Device KIT 1 kit by Does not apply route 2 (two) times daily. 06/23/18   [name], MD  dapagliflozin propanediol (FARXIGA) 10 MG TABS tablet Take 1 tablet (10 mg total) by mouth daily. 04/02/21   [name], MD  guaiFENesin (MUCINEX) 600 MG 12 hr tablet Take 2 tablets (1,200 mg total) by mouth 2 (two) times daily as needed for to loosen phlegm. 05/01/21   Barrett, Erin R, PA-C  isosorbide mononitrate (IMDUR) 30 MG 24 hr tablet Take 0.5 tablets (15 mg total) by mouth daily. 05/01/21   Barrett, Junie Panning  R, PA-C  Lancets (ONETOUCH ULTRASOFT) lancets Use as instructed 06/23/18   [name], MD  lip balm (CARMEX) ointment Apply topically as needed for lip care. 05/01/21   Barrett, Erin R, PA-C  metFORMIN (GLUCOPHAGE) 1000 MG tablet Take 1 tablet (1,000 mg total) by mouth 2 (two) times daily with a meal. 04/02/21   [name], MD  metoprolol succinate (TOPROL XL) 25 MG 24 hr tablet Take 1 tablet (25 mg total) by mouth at bedtime. 05/01/21   Barrett, Erin R, PA-C  nitroGLYCERIN (NITROSTAT) 0.4 MG SL tablet Place 1 tablet (0.4 mg total) under the tongue every 5 (five) minutes as needed for chest pain. 04/04/21 07/03/21  Troy Sine, MD  oxyCODONE (OXY IR/ROXICODONE) 5 MG immediate release tablet Take 1-2 tablets (5-10 mg total) by mouth every 4 (four) hours as needed for severe pain. 05/01/21   Barrett, Erin R, PA-C  rosuvastatin (CRESTOR) 40 MG tablet Take 1 tablet (40 mg total) by mouth daily. 05/01/21   Barrett, Lodema Hong, PA-C    Family History  Family History  Problem Relation Age of Onset   Heart disease Mother        PTCA with stent   Hypertension Mother    Heart attack Father    Diabetes type I Father    Diabetes Brother    Diabetes Sister    Diabetes Brother    Healthy Son    Healthy Son    He indicated that his mother is alive. He indicated that his father is deceased. He indicated that both of his sisters are alive. He indicated that only one of his two brothers is alive. He indicated that both of his sons are alive.  Social History    Social History   Socioeconomic History   Marital status: Married    Spouse name: [name]   Number of children: 2   Years of education: Not on file   Highest education level: Not on file  Occupational History   Occupation: Freight forwarder, Academic librarian: [employer]  Tobacco Use   Smoking status: Former    Types: Cigarettes    Quit date: 11/07/2019    Years since quitting: 1.5   Smokeless tobacco: Never  Vaping Use   Vaping Use: Former   Substance and Sexual Activity   Alcohol use: Yes    Comment: Occastional    Drug use: Never   Sexual activity: Yes    Partners: Female  Other Topics Concern   Not on file  Social History Narrative   Right handed    Caffeine 4 daily    Lives at home with wife ( [name]) and child.   Social Determinants of Health   Financial Resource Strain: Not on file  Food Insecurity: Not on file  Transportation Needs: Not on file  Physical Activity: Not on file  Stress: Not on file  Social Connections: Not on file  Intimate Partner Violence: Not on file     Review of Systems    General:  No chills, fever, night sweats or weight changes.  Cardiovascular:  No chest pain, dyspnea on exertion, edema, orthopnea, palpitations, paroxysmal nocturnal dyspnea. Dermatological: No rash, lesions/masses Respiratory: No cough, dyspnea Urologic: No hematuria, dysuria Abdominal:   No nausea, vomiting, diarrhea, bright red blood per rectum, melena, or hematemesis Neurologic:  No visual changes, wkns, changes in mental status. All other systems reviewed and are otherwise negative except as noted above.  Physical Exam    VS:  BP 110/64   Pulse 92   Ht _0  (1.727 m)   Wt 207 lb (93.9 kg)   BMI 31.47 kg/m  , BMI Body mass index is 31.47 kg/m. GEN: Well nourished, well developed, in no acute distress. HEENT: normal. Neck: Supple, no JVD, carotid bruits, or masses. Cardiac: RRR, no murmurs, rubs, or gallops. No clubbing, cyanosis, edema.  Radials/DP/PT 2+ and equal bilaterally.  Respiratory:  Respirations regular and unlabored, clear to auscultation bilaterally. GI: Soft, nontender, nondistended, BS + x 4. MS: no deformity or atrophy. Skin: warm and dry, no rash.  Surgical incisions clean dry intact well approximated and healing well.  No drainage noted. Neuro:  Strength and sensation are intact. Psych: Normal affect.  Accessory Clinical Findings    Recent Labs: 04/22/2021: TSH 3.690 04/25/2021:  ALT 19 04/26/2021: Magnesium 2.3 04/27/2021: Hemoglobin 9.5; Platelets 132 05/01/2021: BUN 17; Creatinine, Ser 0.87; Potassium 3.6; Sodium 139   Recent Lipid Panel    Component Value Date/Time   CHOL 110 04/02/2021 1219   TRIG 87.0 04/02/2021 1219  HDL 51.10 04/02/2021 1219   CHOLHDL 2 04/02/2021 1219   VLDL 17.4 04/02/2021 1219   LDLCALC 42 04/02/2021 1219   LDLDIRECT 90.0 06/23/2018 1113    ECG personally reviewed by me today-sinus rhythm left axis deviation nonspecific ST and T wave abnormality 92 bpm Echocardiogram 04/22/2020  IMPRESSIONS     1. Left ventricular ejection fraction, by estimation, is 55 to 60%. The  left ventricle has normal function. The left ventricle has no regional  wall motion abnormalities. Left ventricular diastolic parameters were  normal. There is hypokinesis of the left   ventricular, apical segment.   2. Right ventricular systolic function is normal. The right ventricular  size is normal. Tricuspid regurgitation signal is inadequate for assessing  PA pressure.   3. The mitral valve is normal in structure. Trivial mitral valve  regurgitation. No evidence of mitral stenosis.   4. The aortic valve is tricuspid. Aortic valve regurgitation is not  visualized. Mild aortic valve sclerosis is present, with no evidence of  aortic valve stenosis.   5. The inferior vena cava is normal in size with <50% respiratory  variability, suggesting right atrial pressure of 8 mmHg.  Assessment & Plan   1.  Coronary artery disease-notes only mild incisional chest discomfort around surgical sites.  Denies recent episodes of DOE and angina with increased physical activity.  CABG x3 on 04/25/2021.  Has been slowly increasing his physical activity. Continue aspirin, rosuvastatin, Imdur, metoprolol Heart healthy low-sodium diet-salty 6 given Increase physical activity as tolerated  Mixed hyperlipidemia-04/02/2021: Cholesterol 110; HDL 51.10; LDL Cholesterol 42;  Triglycerides 87.0; VLDL 17.4 Continue aspirin, rosuvastatin Heart healthy low-sodium diet-salty 6 given Increase physical activity as tolerated  Hypotension-BP today 110/64.  Blood pressure is well controlled at home.  Maintaining p.o. hydration Lower extremity support stockings  DOE-continues with dyspnea on exertion but reports that it is less than prior to CABG surgery. Heart healthy low-sodium diet-salty 6 given Increase physical activity as tolerated  Type 2 diabetes-glucose 134 on 05/01/2021 Continue Farxiga, metformin Heart healthy low-sodium diet-salty 6 given Increase physical activity as tolerated   Disposition: Follow-up with Dr. Claiborne Billings in 3 months.  Jossie Ng. Jariya Reichow NP-C    05/20/2021, 11:09 AM [address] Office [phone] Fax [phone]  Notice: This dictation was prepared with Dragon dictation along with smaller phrase technology. Any transcriptional errors that result from this process are unintentional and may not be corrected upon review.  I spent 14 minutes examining this patient, reviewing medications, and using patient centered shared decision making involving her cardiac care.  Prior to her visit I spent greater than 20 minutes reviewing her past medical history,  medications, and prior cardiac tests.

## 2021-05-20 ENCOUNTER — Other Ambulatory Visit: Payer: Self-pay

## 2021-05-20 ENCOUNTER — Ambulatory Visit (INDEPENDENT_AMBULATORY_CARE_PROVIDER_SITE_OTHER): Payer: BC Managed Care – PPO | Admitting: General Practice

## 2021-05-20 ENCOUNTER — Encounter: Payer: Self-pay | Admitting: General Practice

## 2021-05-20 VITALS — BP 110/64 | HR 92 | Ht 68.0 in | Wt 207.0 lb

## 2021-05-20 DIAGNOSIS — I951 Orthostatic hypotension: Secondary | ICD-10-CM | POA: Diagnosis not present

## 2021-05-20 DIAGNOSIS — Z951 Presence of aortocoronary bypass graft: Secondary | ICD-10-CM | POA: Diagnosis not present

## 2021-05-20 DIAGNOSIS — R0600 Dyspnea, unspecified: Secondary | ICD-10-CM | POA: Diagnosis not present

## 2021-05-20 DIAGNOSIS — R0609 Other forms of dyspnea: Secondary | ICD-10-CM

## 2021-05-20 DIAGNOSIS — E119 Type 2 diabetes mellitus without complications: Secondary | ICD-10-CM

## 2021-05-20 DIAGNOSIS — E782 Mixed hyperlipidemia: Secondary | ICD-10-CM

## 2021-05-20 MED ORDER — METOPROLOL TARTRATE 25 MG PO TABS: 25.0000 mg | ORAL_TABLET | Freq: Two times a day (BID) | ORAL | 6 refills | Status: DC

## 2021-05-20 MED ORDER — ISOSORBIDE MONONITRATE ER 30 MG PO TB24: 15.0000 mg | ORAL_TABLET | Freq: Every day | ORAL | 3 refills | Status: DC

## 2021-05-20 NOTE — Patient Instructions (Addendum)
Medication Instructions:  STOP METOPROLOL SUCCINATE START METOPROLOL TARTRATE 25MG  TWICE DAILY  *If you need a refill on your cardiac medications before your next appointment, please call your pharmacy*  Lab Work:   Testing/Procedures:  NONE    NONE  Special Instructions PLEASE READ AND FOLLOW SALTY 6-ATTACHED-1,800mg  daily  PLEASE INCREASE PHYSICAL ACTIVITY AS TOLERATED  PLEASE READ AND FOLLOW STRESS REDUCTION TIPS-ATTACHED  PLEASE READ AND FOLLOW SLEEP HYGIENE TIPS-ATTACHED  PLEASE PURCHASE AND USE THERA-BANDS FOR LOWER EXTREMITY EXERCISES  CONTINUE TO TAKE DAILY WEIGHTS  OK TO GO UP AND DOWN THE STAIRS-USE CONTROLLED MOVES, CONTINUE WITH STERNAL PRECAUTIONS FOR YOUR UPPER BODY   Follow-Up: Your next appointment:  3-4 month(s) In Person with Nicki Guadalajarahomas Kelly, MD ONLY  At [facility], you and your health needs are our priority.  As part of our continuing mission to provide you with exceptional heart care, we have created designated Provider Care Teams.  These Care Teams include your primary Cardiologist (physician) and Advanced Practice Providers (APPs -  Physician Assistants and Nurse Practitioners) who all work together to provide you with the care you need, when you need it.  We recommend signing up for the patient portal called "MyChart".  Sign up information is provided on this After Visit Summary.  MyChart is used to connect with patients for Virtual Visits (Telemedicine).  Patients are able to view lab/test results, encounter notes, upcoming appointments, etc.  Non-urgent messages can be sent to your provider as well.   To learn more about what you can do with MyChart, go to [email or web address].              6 SALTY THINGS TO AVOID     1,800MG  DAILY     Mindfulness-Based Stress Reduction Mindfulness-based stress reduction (MBSR) is a program that helps people learn to practice mindfulness. Mindfulness is the practice of intentionally paying attention to the present  moment. MBSR focuses on developing self-awareness, which allows you to respond to life stress without judgment or negative emotions. It can be learned and practiced through techniques such as education, breathing exercises, meditation, and yoga. MBSR includes several mindfulnesstechniques in one program. MBSR works best when you understand the treatment, are willing to try new things, and can commit to spending time practicing what you learn. MBSR training may include learning about: How your emotions, thoughts, and reactions affect your body. New ways to respond to things that cause negative thoughts to start (triggers). How to notice your thoughts and let go of them. Practicing awareness of everyday things that you normally do without thinking. The techniques and goals of different types of meditation. What are the benefits of MBSR? MBSR can have many benefits, which include helping you to: Develop self-awareness. This refers to knowing and understanding yourself. Learn skills and attitudes that help you to participate in your own health care. Learn new ways to care for yourself. Be more accepting about how things are, and let things go. Be less judgmental and approach things with an open mind. Be patient with yourself and trust yourself more. MBSR has also been shown to: Reduce negative emotions, such as depression and anxiety. Improve memory and focus. Change how you sense and approach pain. Boost your body's ability to fight infections. Help you connect better with other people. Improve your sense of well-being. Follow these instructions at home:  Find a local in-person or online MBSR program. Set aside some time regularly for mindfulness practice. Find a mindfulness practice that works best for you.  This may include one or more of the following: Meditation. Meditation involves focusing your mind on a certain thought or activity. Breathing awareness exercises. These help you to stay  present by focusing on your breath. Body scan. For this practice, you lie down and pay attention to each part of your body from head to toe. You can identify tension and soreness and intentionally relax parts of your body. Yoga. Yoga involves stretching and breathing, and it can improve your ability to move and be flexible. It can also provide an experience of testing your body's limits, which can help you release stress. Mindful eating. This way of eating involves focusing on the taste, texture, color, and smell of each bite of food. Because this slows down eating and helps you feel full sooner, it can be an important part of a weight-loss plan. Find a podcast or recording that provides guidance for breathing awareness, body scan, or meditation exercises. You can listen to these any time when you have a free moment to rest without distractions. Follow your treatment plan as told by your health care provider. This may include taking regular medicines and making changes to your diet or lifestyle as recommended. How to practice mindfulness To do a basic awareness exercise: Find a comfortable place to sit. Pay attention to the present moment. Observe your thoughts, feelings, and surroundings just as they are. Avoid placing judgment on yourself, your feelings, or your surroundings. Make note of any judgment that comes up, and let it go. Your mind may wander, and that is okay. Make note of when your thoughts drift, and return your attention to the present moment. To do basic mindfulness meditation: Find a comfortable place to sit. This may include a stable chair or a firm floor cushion. Sit upright with your back straight. Let your arms fall next to your side with your hands resting on your legs. If sitting in a chair, rest your feet flat on the floor. If sitting on a cushion, cross your legs in front of you. Keep your head in a neutral position with your chin dropped slightly. Relax your jaw and rest  the tip of your tongue on the roof of your mouth. Drop your gaze to the floor. You can close your eyes if you like. Breathe normally and pay attention to your breath. Feel the air moving in and out of your nose. Feel your belly expanding and relaxing with each breath. Your mind may wander, and that is okay. Make note of when your thoughts drift, and return your attention to your breath. Avoid placing judgment on yourself, your feelings, or your surroundings. Make note of any judgment or feelings that come up, let them go, and bring your attention back to your breath. When you are ready, lift your gaze or open your eyes. Pay attention to how your body feels after the meditation. Where to find more information You can find more information about MBSR from: Your health care provider. Community-based meditation centers or programs. Programs offered near you. Summary Mindfulness-based stress reduction (MBSR) is a program that teaches you how to intentionally pay attention to the present moment. It is used with other treatments to help you cope better with daily stress, emotions, and pain. MBSR focuses on developing self-awareness, which allows you to respond to life stress without judgment or negative emotions. MBSR programs may involve learning different mindfulness practices, such as breathing exercises, meditation, yoga, body scan, or mindful eating. Find a mindfulness practice that works best  for you, and set aside time for it on a regular basis. This information is not intended to replace advice given to you by your health care provider. Make sure you discuss any questions you have with your healthcare provider. Document Revised: 06/08/2020 Document Reviewed: 06/08/2020 Elsevier Patient Education  2022 Elsevier Inc.   Quality Sleep Information, Adult Quality sleep is important for your mental and physical health. It also improves your quality of life. Quality sleep means you: Are asleep for  most of the time you are in bed. Fall asleep within 30 minutes. Wake up no more than once a night.  Are awake for no longer than 20 minutes if you do wake up during the night. Most adults need 7-8 hours of quality sleep each night. How can poor sleep affect me? If you do not get enough quality sleep, you may have: Mood swings. Daytime sleepiness. Confusion. Decreased reaction time. Sleep disorders, such as insomnia and sleep apnea. Difficulty with: Solving problems. Coping with stress. Paying attention. These issues may affect your performance and productivity at work, school, and at home. Lack of sleep may also put you at higher risk for accidents, suicide,and risky behaviors. If you do not get quality sleep you may also be at higher risk for several health problems, including: Infections. Type 2 diabetes. Heart disease. High blood pressure. Obesity. Worsening of long-term conditions, like arthritis, kidney disease, depression, Parkinson's disease, and epilepsy. What actions can I take to get more quality sleep?     Stick to a sleep schedule. Go to sleep and wake up at about the same time each day. Do not try to sleep less on weekdays and make up for lost sleep on weekends. This does not work. Try to get about 30 minutes of exercise on most days. Do not exercise 2-3 hours before going to bed. Limit naps during the day to 30 minutes or less. Do not use any products that contain nicotine or tobacco, such as cigarettes or e-cigarettes. If you need help quitting, ask your health care provider. Do not drink caffeinated beverages for at least 8 hours before going to bed. Coffee, tea, and some sodas contain caffeine. Do not drink alcohol close to bedtime. Do not eat large meals close to bedtime. Do not take naps in the late afternoon. Try to get at least 30 minutes of sunlight every day. Morning sunlight is best. Make time to relax before bed. Reading, listening to music, or taking a  hot bath promotes quality sleep. Make your bedroom a place that promotes quality sleep. Keep your bedroom dark, quiet, and at a comfortable room temperature. Make sure your bed is comfortable. Take out sleep distractions like TV, a computer, smartphone, and bright lights. If you are lying awake in bed for longer than 20 minutes, get up and do a relaxing activity until you feel sleepy. Work with your health care provider to treat medical conditions that may affect sleeping, such as: Nasal obstruction. Snoring. Sleep apnea and other sleep disorders. Talk to your health care provider if you think any of your prescription medicines may cause you to have difficulty falling or staying asleep. If you have sleep problems, talk with a sleep consultant. If you think you have a sleep disorder, talk with your health care provider about getting evaluated by a specialist. Where to find more information National Sleep Foundation website: https://sleepfoundation.org National Heart, Lung, and Blood Institute (NHLBI): https://hall.info/.pdf Centers for Disease Control and Prevention (CDC): DetailSports.is Contact a health care  provider if you: Have trouble getting to sleep or staying asleep. Often wake up very early in the morning and cannot get back to sleep. Have daytime sleepiness. Have daytime sleep attacks of suddenly falling asleep and sudden muscle weakness (narcolepsy). Have a tingling sensation in your legs with a strong urge to move your legs (restless legs syndrome). Stop breathing briefly during sleep (sleep apnea). Think you have a sleep disorder or are taking a medicine that is affecting your quality of sleep. Summary Most adults need 7-8 hours of quality sleep each night. Getting enough quality sleep is an important part of health and well-being. Make your bedroom a place that promotes quality sleep and avoid things that may cause you to have  poor sleep, such as alcohol, caffeine, smoking, and large meals. Talk to your health care provider if you have trouble falling asleep or staying asleep. This information is not intended to replace advice given to you by your health care provider. Make sure you discuss any questions you have with your healthcare provider. Document Revised: 12/30/2017 Document Reviewed: 12/30/2017 Elsevier Patient Education  2022 [employer].

## 2021-06-04 ENCOUNTER — Other Ambulatory Visit: Payer: Self-pay | Admitting: *Deleted

## 2021-06-04 ENCOUNTER — Ambulatory Visit (INDEPENDENT_AMBULATORY_CARE_PROVIDER_SITE_OTHER): Payer: Self-pay | Admitting: Physician Assistant

## 2021-06-04 ENCOUNTER — Ambulatory Visit
Admission: RE | Admit: 2021-06-04 | Discharge: 2021-06-04 | Disposition: A | Discharge status: Home / Self Care | Payer: BC Managed Care – PPO | Source: Ambulatory Visit | Attending: Thoracic Surgery (Cardiothoracic Vascular Surgery) | Admitting: Thoracic Surgery (Cardiothoracic Vascular Surgery)

## 2021-06-04 ENCOUNTER — Encounter: Payer: Self-pay | Admitting: Physician Assistant

## 2021-06-04 ENCOUNTER — Other Ambulatory Visit: Payer: Self-pay

## 2021-06-04 VITALS — BP 103/69 | HR 84 | Resp 20 | Ht 68.0 in | Wt 209.0 lb

## 2021-06-04 DIAGNOSIS — Z951 Presence of aortocoronary bypass graft: Secondary | ICD-10-CM

## 2021-06-04 NOTE — Progress Notes (Signed)
[address]       [place] [address]             [phone]    Barry Schmidt is a 60 y.o. male patient is s/p CABG x 3. Overall he is doing well, he feels okay. He has had a little shortness of breath with ambulation but still has ambulated for 30 minutes BID. He has had some bilateral edema but I do not see any today on my exam.    1. S/P CABG x 3    Past Medical History:  Diagnosis Date   Diabetes mellitus without complication ([place])    On statin therapy due to risk of future cardiovascular event 10/14/2018   Uncontrolled diabetes mellitus without complication, with long-term current use of insulin 06/23/2018   Diagnosed in 2007; was morbidly obese at that time 260 pounds. He lost weight and did well.    No past surgical history pertinent negatives on file. Scheduled Meds: Current Outpatient Medications on File Prior to Visit  Medication Sig Dispense Refill   ACCU-CHEK AVIVA PLUS test strip USE AS INSTRUCTED TO TEST BLOOD GLUCOSE 100 each 12   acetaminophen (TYLENOL) 325 MG tablet Take 2 tablets (650 mg total) by mouth every 6 (six) hours as needed for mild pain.     aspirin EC 81 MG tablet Take 81 mg by mouth at bedtime.     Blood Glucose Monitoring Suppl (ONE TOUCH ULTRA 2) w/Device KIT 1 kit by Does not apply route 2 (two) times daily. 1 each 0   dapagliflozin propanediol (FARXIGA) 10 MG TABS tablet Take 1 tablet (10 mg total) by mouth daily. 90 tablet 1   guaiFENesin (MUCINEX) 600 MG 12 hr tablet Take 2 tablets (1,200 mg total) by mouth 2 (two) times daily as needed for to loosen phlegm.     isosorbide mononitrate (IMDUR) 30 MG 24 hr tablet Take 0.5 tablets (15 mg total) by mouth daily. 45 tablet 3   Lancets (ONETOUCH ULTRASOFT) lancets Use as instructed 100 each 12   lip balm (CARMEX) ointment Apply topically as needed for lip care. 7 g 0   metFORMIN (GLUCOPHAGE) 1000 MG tablet Take 1 tablet (1,000 mg total) by mouth 2 (two) times daily with a meal. 180  tablet 1   metoprolol tartrate (LOPRESSOR) 25 MG tablet Take 1 tablet (25 mg total) by mouth 2 (two) times daily. 60 tablet 6   nitroGLYCERIN (NITROSTAT) 0.4 MG SL tablet Place 1 tablet (0.4 mg total) under the tongue every 5 (five) minutes as needed for chest pain. 25 tablet 6   oxyCODONE (OXY IR/ROXICODONE) 5 MG immediate release tablet Take 1-2 tablets (5-10 mg total) by mouth every 4 (four) hours as needed for severe pain. 30 tablet 0   rosuvastatin (CRESTOR) 40 MG tablet Take 1 tablet (40 mg total) by mouth daily. 30 tablet 3   No current facility-administered medications on file prior to visit.     Allergies  Allergen Reactions   Penicillins Hives    Reaction: 15-20 years ago   Active Problems:   * No active hospital problems. *  Blood pressure 103/69, pulse 84, resp. rate 20, height 5' 8"  (1.727 m), weight 209 lb (94.8 kg), SpO2 95 %.  Cor: RRR, no edema Pulm: CTA bilaterally and in all fields Abd: no tenderness Ext: no edema Wound: clean sternal incision, clean radial artery incision, EVH sites healing well. Right neck stitch removed.   CLINICAL DATA:  Status post CABG   EXAM: CHEST - 2 VIEW   COMPARISON:  Prior chest x-ray 04/27/2021   FINDINGS: Surgical changes of prior median sternotomy for multivessel CABG. The lungs are clear. No pulmonary edema, pleural effusion or pneumothorax. Significantly improved aeration compared to the prior chest x-ray.   IMPRESSION: No active cardiopulmonary disease.     Electronically Signed   By: Jacqulynn Cadet M.D.   On: 06/04/2021 13:13   Subjective Objective: Vital signs (most recent): Blood pressure 103/69, pulse 84, resp. rate 20, height 5' 8"  (1.727 m), weight 209 lb (94.8 kg), SpO2 95 %. Assessment & Plan  Post-op CABG with overall good recovery. He has had some referred shoulder pain but this has improved. Sternum is stable to palpation without popping or clicking.  He is able to drive since he no longer needs  pain medication during the day.  Recommend cardiac rehab however, the wait is 6 months. Encouraged activity at home walking, biking, elliptical without the arms. Weight training in "the tube". Keep to <10 lbs with the upper ext.  Continue to wash incisions with soap and water. Keep blood glucose under 150 for optimal wound healing. He has been running 100-130s at home.  Recommend holding off on steroid injections for his shoulder post-op since he is immunocompromised.   Plan: Will bring him back in 3 weeks to discuss returning to work date and restrictions. He will need to return light duty if its before the 3 month mark.     [name] 06/04/2021

## 2021-06-07 ENCOUNTER — Telehealth: Payer: Self-pay | Admitting: *Deleted

## 2021-06-07 NOTE — Telephone Encounter (Signed)
Mrs. Espinola called in reference to her husband Mr. Kerce. Per wife, they were seen in our clinic on Tuesday by a PA and the need for potential lasix therapy was discussed r/t leg edema. Per PA's note, edema was not noted on exam. Wife states that patient woke up this morning with a slight increase of edema in his left ankle. Patient is s/p CABG 7/21 with SV harvesting of left leg. Per wife, patient is elevating legs at rest but is not wearing compression stockings. Wife states he is unable to limit water intake r/t taking medications that require him to drink plenty of water. Wife also states his weight is up from 205lb at discharge to 210lb with 211lb being his pre surgery weight. Elevation of legs while at rest, use of compression stockings or ace bandage on the swollen extremity, and increase in appetite were discussed with patient's wife. Denies SOB, redness or pain at site. Spoke with Jillyn Hidden, PA, regarding lasix in which he stated there is no need at this time for medication. Per PA, wife informed to continue encourage leg elevation and to monitor over the weekend. Wife advised to call the office on Tuesday if patient's symptoms continue to increase. Wife verbalizes understanding.

## 2021-06-11 ENCOUNTER — Telehealth: Payer: Self-pay | Admitting: Cardiovascular Disease

## 2021-06-11 DIAGNOSIS — R609 Edema, unspecified: Secondary | ICD-10-CM

## 2021-06-11 DIAGNOSIS — E119 Type 2 diabetes mellitus without complications: Secondary | ICD-10-CM

## 2021-06-11 MED ORDER — POTASSIUM CHLORIDE ER 10 MEQ PO TBCR: EXTENDED_RELEASE_TABLET | ORAL | 1 refills | Status: DC

## 2021-06-11 MED ORDER — FUROSEMIDE 20 MG PO TABS: ORAL_TABLET | ORAL | 1 refills | Status: DC

## 2021-06-11 NOTE — Telephone Encounter (Signed)
Spoke with pt and pt's wife re message and pt's wife noted just seeing Tessa at [facility] office pt had  some swelling at that time and was told  to keep an eye on it and if notes weight gain of 3 lbs or more to call and she would order some Lasix Per wife called [facility] and Julian Hy is on vacation this week and was told to call cardiology Pt has been watching slat intake and is elevating legs Will forward to Edd Fabian NP for review and recommendations.

## 2021-06-11 NOTE — Telephone Encounter (Signed)
Pt c/o swelling: STAT is pt has developed SOB within 24 hours  If swelling, where is the swelling located? Legs and feet   How much weight have you gained and in what time span? 3 overnight  Have you gained 3 pounds in a day or 5 pounds in a week? Yes one day  Do you have a log of your daily weights (if so, list)? Yes on yesterday 210.4 this morning 213.0  Are you currently taking a fluid pill? No  Are you currently SOB? This morning patient chest feels heavy   Have you traveled recently? No

## 2021-06-11 NOTE — Telephone Encounter (Signed)
Pt aware and verbalizes understanding./cy ?

## 2021-06-18 ENCOUNTER — Other Ambulatory Visit: Payer: Self-pay

## 2021-06-18 DIAGNOSIS — E119 Type 2 diabetes mellitus without complications: Secondary | ICD-10-CM | POA: Diagnosis not present

## 2021-06-18 DIAGNOSIS — R609 Edema, unspecified: Secondary | ICD-10-CM

## 2021-06-18 LAB — BASIC METABOLIC PANEL
BUN/Creatinine Ratio: 23 — ABNORMAL HIGH (ref 9–20)
BUN: 18 mg/dL (ref 6–24)
CO2: 23 mmol/L (ref 20–29)
Calcium: 9.2 mg/dL (ref 8.7–10.2)
Chloride: 101 mmol/L (ref 96–106)
Creatinine, Ser: 0.79 mg/dL (ref 0.76–1.27)
Glucose: 121 mg/dL — ABNORMAL HIGH (ref 65–99)
Potassium: 4.5 mmol/L (ref 3.5–5.2)
Sodium: 140 mmol/L (ref 134–144)
eGFR: 103 mL/min/{1.73_m2} (ref >59)

## 2021-06-21 ENCOUNTER — Telehealth: Payer: Self-pay

## 2021-06-21 NOTE — Telephone Encounter (Signed)
FYI

## 2021-06-21 NOTE — Telephone Encounter (Signed)
[facility] - Client TELEPHONE ADVICE RECORD [facility] Patient Name: Barry Schmidt Gender: Male DOB: May 24, 1961 Age: 60 Y 6 M 28 D Return Phone Number: [phone] (Primary), [phone] (Secondary) Address: City/ State/ Zip: [place] [address] Client  [facility] - [employer] [facility] Physician Asencion Partridge- MD Contact Type Call Who Is Calling Patient / Member / Family / Caregiver Call Type Triage / Clinical Caller Name [name] Relationship To Patient Spouse Return Phone Number [phone] (Secondary) Chief Complaint Blood Sugar High Reason for Call Symptomatic / Request for Health Information Initial Comment Caller states, pt has high bs-178 and sinus infection- soreness behind throat. Additional Comment Pt has an appt Tuesday. Translation No Nurse Assessment Nurse: Humfleet, RN, Marchelle Folks Date/Time (Eastern Time): 06/21/2021 9:37:55 AM Confirm and document reason for call. If symptomatic, describe symptoms. ---caller states her husband blood sugar 178. has sinus pain is face and has swollen lymph node on the left side. earache on left side, runny nose, sneezing sees doctor on Tuesday. asking for antibiotic today though. 2 months post op triple bypass. was seen by cardiologist post op. Does the patient have any new or worsening symptoms? ---Yes Will a triage be completed? ---Yes Related visit to physician within the last 2 weeks? ---No Does the PT have any chronic conditions? (i.e. diabetes, asthma, this includes High risk factors for pregnancy, etc.) ---Yes List chronic conditions. ---CABG x3 Is this a behavioral health or substance abuse call? ---No Guidelines Guideline Title Affirmed Question Affirmed Notes Nurse Date/Time (Eastern Time) Sinus Pain or Congestion [1] SEVERE pain AND [2] not Humfleet, RN, Marchelle Folks 06/21/2021 9:40:06 AM PLEASE NOTE:  All timestamps contained within this report are represented as [place] Standard Time. CONFIDENTIALTY NOTICE: This fax transmission is intended only for the addressee. It contains information that is legally privileged, confidential or otherwise protected from use or disclosure. If you are not the intended recipient, you are strictly prohibited from reviewing, disclosing, copying using or disseminating any of this information or taking any action in reliance on or regarding this information. If you have received this fax in error, please notify us immediately by telephone so that we can arrange for its return to [name]. Phone: [phone], Toll-Free: [phone], Fax: [phone] Page: 2 of 2 Call Id: [id number] Guidelines Guideline Title Affirmed Question Affirmed Notes Nurse Date/Time Lamount Cohen Time) improved 2 hours after pain medicine Disp. Time Lamount Cohen Time) Disposition Final User 06/21/2021 9:46:45 AM See HCP within 4 Hours (or PCP triage) Yes Humfleet, RN, Earnestine Leys Disagree/Comply Comply Caller Understands Yes PreDisposition Did not know what to do Care Advice Given Per Guideline SEE HCP (OR PCP TRIAGE) WITHIN 4 HOURS: * IF OFFICE WILL BE OPEN: You need to be seen within the next 3 or 4 hours. Call your doctor (or NP/PA) now or as soon as the office opens. CARE ADVICE given per Sinus Pain or Congestion (Adult) guideline. * You become worse CALL BACK IF: * For sinus pain that does not get better after pain medicine and nasal washes, apply a cold pack or ice in a wet washcloth for 20 minutes four times a day as needed. COLD PACK FOR SINUS PAIN: Comments User: Jaclynn Major, RN Date/Time Lamount Cohen Time): 06/21/2021 9:47:53 AM office stated there no appointments today at all in person or virtually. [facility] Referrals Warm transfer to backlin

## 2021-06-22 ENCOUNTER — Other Ambulatory Visit: Payer: Self-pay

## 2021-06-22 ENCOUNTER — Encounter: Payer: Self-pay | Admitting: Emergency Medicine

## 2021-06-22 ENCOUNTER — Ambulatory Visit
Admission: EM | Admit: 2021-06-22 | Discharge: 2021-06-22 | Disposition: A | Discharge status: Home / Self Care | Payer: BC Managed Care – PPO | Attending: Family Medicine | Admitting: Family Medicine

## 2021-06-22 DIAGNOSIS — J0100 Acute maxillary sinusitis, unspecified: Secondary | ICD-10-CM | POA: Diagnosis not present

## 2021-06-22 MED ORDER — DOXYCYCLINE HYCLATE 100 MG PO CAPS
100.0000 mg | ORAL_CAPSULE | Freq: Two times a day (BID) | ORAL | 0 refills | Status: DC
Start: 2021-06-22 — End: 2021-07-12

## 2021-06-22 MED ORDER — MUPIROCIN 2 % EX OINT: 1.0000 "application " | TOPICAL_OINTMENT | Freq: Two times a day (BID) | CUTANEOUS | 0 refills | Status: DC

## 2021-06-22 MED ORDER — BENZONATATE 100 MG PO CAPS
200.0000 mg | ORAL_CAPSULE | Freq: Three times a day (TID) | ORAL | 0 refills | Status: DC | PRN
Start: 2021-06-22 — End: 2021-06-25

## 2021-06-22 NOTE — ED Provider Notes (Signed)
[facility]    CSN: [id number] Arrival date & time: 06/22/21  0957      History   Chief Complaint No chief complaint on file.   HPI Barry Schmidt is a 60 y.o. male.   HPI Patient presents with URI symptoms including cough, left sided throat pain, congestion and facial pressure and runny nose x 1 week. Recently began coughing and he is s/p CABG. He is concern as symptoms appear  to be worsening. No fever. No known sick contact..Taking Mucinex and tylenol.   Past Medical History:  Diagnosis Date   Diabetes mellitus without complication ([place])    On statin therapy due to risk of future cardiovascular event 10/14/2018   Uncontrolled diabetes mellitus without complication, with long-term current use of insulin 06/23/2018   Diagnosed in 2007; was morbidly obese at that time 260 pounds. He lost weight and did well.     Patient Active Problem List   Diagnosis Date Noted   S/P CABG x 3    Coronary artery disease involving coronary bypass graft of native heart with unstable angina pectoris ([place]) 04/24/2021   Exertional angina ([place])    Mixed hyperlipidemia 04/02/2021   Former smoker 04/02/2021   Adhesive capsulitis of left shoulder 01/19/2020   Controlled type 2 diabetes mellitus with neuropathy ([place]) 10/14/2018   On statin therapy due to risk of future cardiovascular event 10/14/2018   Femoral neuropathy of right lower extremity 10/14/2018    Past Surgical History:  Procedure Laterality Date   CORONARY ARTERY BYPASS GRAFT N/A 04/25/2021   Procedure: CORONARY ARTERY BYPASS GRAFTING (CABG)x 3 ON CARDIOPULMONARY BYPASS USING [place], LEFT RADIAL ARTERY AND  GSV.;  Surgeon: Melrose Nakayama, MD;  Location: [place];  Service: Open Heart Surgery;  Laterality: N/A;   ENDOVEIN HARVEST OF GREATER SAPHENOUS VEIN Bilateral 04/25/2021   Procedure: ENDOVEIN HARVEST OF GREATER SAPHENOUS VEIN;  Surgeon: Melrose Nakayama, MD;  Location: [place];  Service: Open Heart Surgery;   Laterality: Bilateral;   LEFT HEART CATH AND CORONARY ANGIOGRAPHY N/A 04/24/2021   Procedure: LEFT HEART CATH AND CORONARY ANGIOGRAPHY;  Surgeon: Troy Sine, MD;  Location: [place] [facility];  Service: Cardiovascular;  Laterality: N/A;   RADIAL ARTERY HARVEST Left 04/25/2021   Procedure: RADIAL ARTERY HARVEST;  Surgeon: Melrose Nakayama, MD;  Location: [place];  Service: Open Heart Surgery;  Laterality: Left;   TEE WITHOUT CARDIOVERSION N/A 04/25/2021   Procedure: TRANSESOPHAGEAL ECHOCARDIOGRAM (TEE);  Surgeon: Melrose Nakayama, MD;  Location: [place];  Service: Open Heart Surgery;  Laterality: N/A;       Home Medications    Prior to Admission medications   Medication Sig Start Date End Date Taking? Authorizing Provider  benzonatate (TESSALON) 100 MG capsule Take 2 capsules (200 mg total) by mouth 3 (three) times daily as needed for cough. 06/22/21  Yes Scot Jun, FNP  doxycycline (VIBRAMYCIN) 100 MG capsule Take 1 capsule (100 mg total) by mouth 2 (two) times daily. 06/22/21  Yes Scot Jun, FNP  mupirocin ointment (BACTROBAN) 2 % Apply 1 application topically 2 (two) times daily. 06/22/21  Yes Scot Jun, FNP  ACCU-CHEK AVIVA PLUS test strip USE AS INSTRUCTED TO TEST BLOOD GLUCOSE 08/25/18   [name], MD  acetaminophen (TYLENOL) 325 MG tablet Take 2 tablets (650 mg total) by mouth every 6 (six) hours as needed for mild pain. 05/01/21   Barrett, Lodema Hong, PA-C  aspirin EC 81 MG tablet Take 81  mg by mouth at bedtime.    [provider]  Blood Glucose Monitoring Suppl (ONE TOUCH ULTRA 2) w/Device KIT 1 kit by Does not apply route 2 (two) times daily. 06/23/18   [name], MD  dapagliflozin propanediol (FARXIGA) 10 MG TABS tablet Take 1 tablet (10 mg total) by mouth daily. 04/02/21   [name], MD  furosemide (LASIX) 20 MG tablet 1 TAB FOR 3 DAYS 06/11/21   Deberah Pelton, NP  guaiFENesin (MUCINEX) 600 MG 12 hr tablet Take 2 tablets (1,200  mg total) by mouth 2 (two) times daily as needed for to loosen phlegm. 05/01/21   Barrett, Erin R, PA-C  isosorbide mononitrate (IMDUR) 30 MG 24 hr tablet Take 0.5 tablets (15 mg total) by mouth daily. 05/20/21   Troy Sine, MD  Lancets [facility] ULTRASOFT) lancets Use as instructed 06/23/18   [name], MD  lip balm [facility]) ointment Apply topically as needed for lip care. 05/01/21   Barrett, Erin R, PA-C  metFORMIN (GLUCOPHAGE) 1000 MG tablet Take 1 tablet (1,000 mg total) by mouth 2 (two) times daily with a meal. 04/02/21   [name], MD  metoprolol tartrate (LOPRESSOR) 25 MG tablet Take 1 tablet (25 mg total) by mouth 2 (two) times daily. 05/20/21   Deberah Pelton, NP  nitroGLYCERIN (NITROSTAT) 0.4 MG SL tablet Place 1 tablet (0.4 mg total) under the tongue every 5 (five) minutes as needed for chest pain. 04/04/21 07/03/21  Troy Sine, MD  oxyCODONE (OXY IR/ROXICODONE) 5 MG immediate release tablet Take 1-2 tablets (5-10 mg total) by mouth every 4 (four) hours as needed for severe pain. 05/01/21   Barrett, Erin R, PA-C  potassium chloride (KLOR-CON) 10 MEQ tablet 1 TAB FOR 3 DAYS 06/11/21   Deberah Pelton, NP  rosuvastatin (CRESTOR) 40 MG tablet Take 1 tablet (40 mg total) by mouth daily. 05/01/21   Barrett, Lodema Hong, PA-C    Family History Family History  Problem Relation Age of Onset   Heart disease Mother        PTCA with stent   Hypertension Mother    Heart attack Father    Diabetes type I Father    Diabetes Brother    Diabetes Sister    Diabetes Brother    Healthy Son    Healthy Son     Social History Social History   Tobacco Use   Smoking status: Former    Types: Cigarettes    Quit date: 11/07/2019    Years since quitting: 1.6   Smokeless tobacco: Never  Vaping Use   Vaping Use: Former  Substance Use Topics   Alcohol use: Yes    Comment: Occastional    Drug use: Never     Allergies   Penicillins   Review of Systems Review of Systems Pertinent  negatives listed in HPI   Physical Exam Triage Vital Signs ED Triage Vitals  Enc Vitals Group     BP 06/22/21 1133 115/78     Pulse Rate 06/22/21 1133 73     Resp 06/22/21 1133 18     Temp 06/22/21 1133 98.1 F (36.7 C)     Temp Source 06/22/21 1133 Oral     SpO2 06/22/21 1133 98 %     Weight --      Height --      Head Circumference --      Peak Flow --      Pain Score 06/22/21  1134 5     Pain Loc --      Pain Edu? --      Excl. in [place]? --    No data found.  Updated Vital Signs BP 115/78 (BP Location: Right Arm)   Pulse 73   Temp 98.1 F (36.7 C) (Oral)   Resp 18   SpO2 98%   Visual Acuity Right Eye Distance:   Left Eye Distance:   Bilateral Distance:    Right Eye Near:   Left Eye Near:    Bilateral Near:     Physical Exam  General Appearance:    Alert, cooperative, no distress  HENT:   Normocephalic, ears normal, nares mucosal edema with congestion, rhinorrhea, oropharynx clear   Eyes:    PERRL, conjunctiva/corneas clear, EOM's intact       Lungs:     Clear to auscultation bilaterally, respirations unlabored  Heart:    Regular rate and rhythm  Neurologic:   Awake, alert, oriented x 3. No apparent focal neurological           defect.      UC Treatments / Results  Labs (all labs ordered are listed, but only abnormal results are displayed) Labs Reviewed - No data to display  EKG   Radiology No results found.  Procedures Procedures (including critical care time)  Medications Ordered in UC Medications - No data to display  Initial Impression / Assessment and Plan / UC Course  I have reviewed the triage vital signs and the nursing notes.  Pertinent labs & imaging results that were available during my care of the patient were reviewed by me and considered in my medical decision making (see chart for details).    Acute non-recurrent sinusitis Treatment per discharge medication orders RTC PRN Final Clinical Impressions(s) / UC Diagnoses   Final  diagnoses:  Acute non-recurrent maxillary sinusitis   Discharge Instructions   None    ED Prescriptions     Medication Sig Dispense Auth. Provider   benzonatate (TESSALON) 100 MG capsule Take 2 capsules (200 mg total) by mouth 3 (three) times daily as needed for cough. 40 capsule Scot Jun, FNP   mupirocin ointment (BACTROBAN) 2 % Apply 1 application topically 2 (two) times daily. 30 g Scot Jun, FNP   doxycycline (VIBRAMYCIN) 100 MG capsule Take 1 capsule (100 mg total) by mouth 2 (two) times daily. 20 capsule Scot Jun, FNP      PDMP not reviewed this encounter.   Scot Jun, FNP 06/22/21 [phone]

## 2021-06-22 NOTE — ED Triage Notes (Signed)
Sinus pain and pressure x 1 week  

## 2021-06-25 ENCOUNTER — Ambulatory Visit (INDEPENDENT_AMBULATORY_CARE_PROVIDER_SITE_OTHER): Payer: BC Managed Care – PPO | Admitting: Family Medicine

## 2021-06-25 ENCOUNTER — Encounter: Payer: Self-pay | Admitting: Family Medicine

## 2021-06-25 ENCOUNTER — Ambulatory Visit (INDEPENDENT_AMBULATORY_CARE_PROVIDER_SITE_OTHER): Payer: BC Managed Care – PPO | Admitting: Physician Assistant

## 2021-06-25 ENCOUNTER — Other Ambulatory Visit: Payer: Self-pay

## 2021-06-25 ENCOUNTER — Encounter: Payer: Self-pay | Admitting: Physician Assistant

## 2021-06-25 VITALS — BP 118/80 | HR 74 | Temp 97.7°F | Ht 68.0 in | Wt 212.6 lb

## 2021-06-25 VITALS — BP 108/68 | HR 86 | Resp 20 | Ht 68.0 in | Wt 212.0 lb

## 2021-06-25 DIAGNOSIS — Z951 Presence of aortocoronary bypass graft: Secondary | ICD-10-CM | POA: Diagnosis not present

## 2021-06-25 DIAGNOSIS — E1140 Type 2 diabetes mellitus with diabetic neuropathy, unspecified: Secondary | ICD-10-CM

## 2021-06-25 DIAGNOSIS — E1169 Type 2 diabetes mellitus with other specified complication: Secondary | ICD-10-CM

## 2021-06-25 DIAGNOSIS — Z23 Encounter for immunization: Secondary | ICD-10-CM | POA: Diagnosis not present

## 2021-06-25 DIAGNOSIS — E782 Mixed hyperlipidemia: Secondary | ICD-10-CM

## 2021-06-25 DIAGNOSIS — I2510 Atherosclerotic heart disease of native coronary artery without angina pectoris: Secondary | ICD-10-CM | POA: Diagnosis not present

## 2021-06-25 DIAGNOSIS — Z87891 Personal history of nicotine dependence: Secondary | ICD-10-CM

## 2021-06-25 LAB — MICROALBUMIN / CREATININE URINE RATIO
Creatinine,U: 78.5 mg/dL
Microalb Creat Ratio: 0.9 mg/g (ref 0.0–30.0)
Microalb, Ur: <0.7 mg/dL (ref 0.0–1.9)

## 2021-06-25 LAB — POCT GLYCOSYLATED HEMOGLOBIN (HGB A1C): Hemoglobin A1C: 5.9 % — AB (ref 4.0–5.6)

## 2021-06-25 MED ORDER — ACCU-CHEK AVIVA PLUS VI STRP: ORAL_STRIP | 12 refills | Status: DC

## 2021-06-25 MED ORDER — ROSUVASTATIN CALCIUM 40 MG PO TABS: 40.0000 mg | ORAL_TABLET | Freq: Every day | ORAL | 3 refills | Status: DC

## 2021-06-25 NOTE — Patient Instructions (Signed)
Please return in 3 months for diabetes follow up   Please check your sugars in the mornings fastings (goal < 120) and 2 hours after lunch or dinner periodically (goal < 160). Eat a diabetic diet to get sugars under better control.   Today you were given your flu vaccination.    If you have any questions or concerns, please don't hesitate to send me a message via MyChart or call the office at [phone]. Thank you for visiting with [name] today! It's our pleasure caring for you.

## 2021-06-25 NOTE — Progress Notes (Signed)
Subjective  CC:  Chief Complaint  Patient presents with   Diabetes   Hyperlipidemia    HPI: Barry Schmidt is a 60 y.o. male who presents to the office today for follow up of diabetes and problems listed above in the chief complaint.  CAD: reviewed all cards and cvts and hospitalization notes/labs; s/p tripple CABBG for multivessel disease with angina. Recovering well. Still with chest wall pain. Surgery 04/25/2021 Sinusitis on doxy x 3 days.  Diabetes follow up: His diabetic control is reported as Worse. Fastings are running high. Diet is fair: eating too many sweets. Activity is improving. Walking daily for exercise. Couldn't get in for cardiac rehab.  He denies exertional CP or SOB or symptomatic hypoglycemia. He denies foot sores or paresthesias. Due flu shot HLD is well controlled. On crestor 40.   Wt Readings from Last 3 Encounters:  06/25/21 212 lb 9.6 oz (96.4 kg)  06/04/21 209 lb (94.8 kg)  05/20/21 207 lb (93.9 kg)    BP Readings from Last 3 Encounters:  06/25/21 118/80  06/22/21 115/78  06/04/21 103/69    Assessment  1. Controlled type 2 diabetes mellitus with neuropathy ([place])   2. Combined hyperlipidemia associated with type 2 diabetes mellitus ([place])   3. Coronary artery disease involving native coronary artery of native heart without angina pectoris   4. S/P CABG x 3, 04/2021   5. Former smoker   6. Need for immunization against influenza      Plan  Diabetes is currently well controlled. Will monitor fastings and postprandials andimprove diet. Recheck 3 months. Should improve: recent elevations inreadings due to stress of surgery, meds and recent sinus infection. Check urine. No ACE due to hypotension and neg past MAC ratio Flu shot today HLD refilled crestor 40 CAD: s/p CABG. Recovering well. Will continue to exercise. Asa, imdur and BB.   Follow up: 3 mo for dm. Orders Placed This Encounter  Procedures   Flu Vaccine QUAD 29moIM (Fluarix, Fluzone & Alfiuria  Quad PF)   Microalbumin / creatinine urine ratio   POCT HgB A1C    Meds ordered this encounter  Medications   glucose blood (ACCU-CHEK AVIVA PLUS) test strip    Sig: USE AS INSTRUCTED TO TEST BLOOD GLUCOSE    Dispense:  100 each    Refill:  12   rosuvastatin (CRESTOR) 40 MG tablet    Sig: Take 1 tablet (40 mg total) by mouth at bedtime.    Dispense:  90 tablet    Refill:  3       Immunization History  Administered Date(s) Administered   Influenza,inj,Quad PF,6+ Mos 06/24/2018, 06/03/2019, 06/25/2021   Janssen (J&J) SARS-COV-2 Vaccination 02/29/2020   Pneumococcal Polysaccharide-23 10/06/2010   Tdap 10/06/2012    Diabetes Related Lab Review: Lab Results  Component Value Date   HGBA1C 5.9 (A) 06/25/2021   HGBA1C 7.4 (H) 04/25/2021   HGBA1C 7.0 (A) 04/02/2021    Lab Results  Component Value Date   MICROALBUR <0.7 04/02/2021   Lab Results  Component Value Date   CREATININE 0.79 06/18/2021   BUN 18 06/18/2021   NA 140 06/18/2021   K 4.5 06/18/2021   CL 101 06/18/2021   CO2 23 06/18/2021   Lab Results  Component Value Date   CHOL 110 04/02/2021   CHOL 137 12/09/2019   CHOL 206 (H) 06/03/2019   Lab Results  Component Value Date   HDL 51.10 04/02/2021   HDL 61.10 12/09/2019   HDL 54.90 06/03/2019  Lab Results  Component Value Date   LDLCALC 42 04/02/2021   LDLCALC 62 12/09/2019   LDLCALC 123 (H) 06/03/2019   Lab Results  Component Value Date   TRIG 87.0 04/02/2021   TRIG 71.0 12/09/2019   TRIG 137.0 06/03/2019   Lab Results  Component Value Date   CHOLHDL 2 04/02/2021   CHOLHDL 2 12/09/2019   CHOLHDL 4 06/03/2019   Lab Results  Component Value Date   LDLDIRECT 90.0 06/23/2018   The ASCVD Risk score (Arnett DK, et al., 2019) failed to calculate for the following reasons:   The valid total cholesterol range is 130 to 320 mg/dL I have reviewed the PMH, Fam and Soc history. Patient Active Problem List   Diagnosis Date Noted   S/P CABG x 3,  04/2021     Priority: High   CAD (coronary artery disease), native coronary artery 04/24/2021    Priority: High   Former smoker 04/02/2021    Priority: High    Quit 11/2019    Controlled type 2 diabetes mellitus with neuropathy ([place]) 10/14/2018    Priority: High    Diagnosed in 2007; was morbidly obese at that time 260 pounds. He lost weight and did well.  Negative urine MAC 07/2018; not on ACE. Normotensive    Combined hyperlipidemia associated with type 2 diabetes mellitus ([place]) 10/14/2018    Priority: High   Adhesive capsulitis of left shoulder 01/19/2020    Priority: Medium   Femoral neuropathy of right lower extremity 10/14/2018    Priority: Medium    Social History: Patient  reports that he quit smoking about 19 months ago. His smoking use included cigarettes. He has never used smokeless tobacco. He reports current alcohol use. He reports that he does not use drugs.  Review of Systems: Ophthalmic: negative for eye pain, loss of vision or double vision Cardiovascular: negative for chest pain Respiratory: negative for SOB or persistent cough Gastrointestinal: negative for abdominal pain Genitourinary: negative for dysuria or gross hematuria MSK: negative for foot lesions Neurologic: negative for weakness or gait disturbance  Objective  Vitals: BP 118/80   Pulse 74   Temp 97.7 F (36.5 C) (Temporal)   Ht _0  (1.727 m)   Wt 212 lb 9.6 oz (96.4 kg)   SpO2 96%   BMI 32.33 kg/m  General: well appearing, no acute distress  Psych:  Alert and oriented, normal mood and affect Cardiovascular:  Nl S1 and S2, RRR without murmur, gallop or rub. no edema, well healed surgical scar Respiratory:  Good breath sounds bilaterally, CTAB with normal effort, no rales Gastrointestinal: normal BS, soft, nontender Foot exam: no erythema, pallor, or cyanosis visible nl proprioception and sensation to monofilament testing bilaterally, +2 distal pulses bilaterally    Diabetic education:  ongoing education regarding chronic disease management for diabetes was given today. We continue to reinforce the ABC's of diabetic management: A1c (<7 or 8 dependent upon patient), tight blood pressure control, and cholesterol management with goal LDL < 100 minimally. We discuss diet strategies, exercise recommendations, medication options and possible side effects. At each visit, we review recommended immunizations and preventive care recommendations for diabetics and stress that good diabetic control can prevent other problems. See below for this patient's data.   Commons side effects, risks, benefits, and alternatives for medications and treatment plan prescribed today were discussed, and the patient expressed understanding of the given instructions. Patient is instructed to call or message via MyChart if he/she has any questions or concerns  regarding our treatment plan. No barriers to understanding were identified. We discussed Red Flag symptoms and signs in detail. Patient expressed understanding regarding what to do in case of urgent or emergency type symptoms.  Medication list was reconciled, printed and provided to the patient in AVS. Patient instructions and summary information was reviewed with the patient as documented in the AVS. This note was prepared with assistance of Dragon voice recognition software. Occasional wrong-word or sound-a-like substitutions may have occurred due to the inherent limitations of voice recognition software  This visit occurred during the SARS-CoV-2 public health emergency.  Safety protocols were in place, including screening questions prior to the visit, additional usage of staff PPE, and extensive cleaning of exam room while observing appropriate contact time as indicated for disinfecting solutions.

## 2021-06-25 NOTE — Progress Notes (Signed)
    [address]       [place] [address]             [phone]    Barry Schmidt is a 60 y.o. male patient is s/p CABG x 3. He was seen about 3 weeks ago and at that time he is doing well, he feels okay. He has had a little shortness of breath with ambulation but still has ambulated for 30 minutes BID. He has had some bilateral edema but he didn't have any during his office visit.   He returns to the office today for a discussion about returning to work. We discussed returning to light duty Oct 3rd and full duty Nov 7th.    1. S/P CABG x 3    Past Medical History:  Diagnosis Date   Diabetes mellitus without complication (H[facility])    On statin therapy due to risk of future cardiovascular event 10/14/2018   Uncontrolled diabetes mellitus without complication, with long-term current use of insulin 06/23/2018   Diagnosed in 2007; was morbidly obese at that time 260 pounds. He lost weight and did well.    No past surgical history pertinent negatives on file. Scheduled Meds: Current Outpatient Medications on File Prior to Visit  Medication Sig Dispense Refill   ACCU-CHEK AVIVA PLUS test strip USE AS INSTRUCTED TO TEST BLOOD GLUCOSE 100 each 12   acetaminophen (TYLENOL) 325 MG tablet Take 2 tablets (650 mg total) by mouth every 6 (six) hours as needed for mild pain.     aspirin EC 81 MG tablet Take 81 mg by mouth at bedtime.     Blood Glucose Monitoring Suppl (ONE TOUCH ULTRA 2) w/Device KIT 1 kit by Does not apply route 2 (two) times daily. 1 each 0   dapagliflozin propanediol (FARXIGA) 10 MG TABS tablet Take 1 tablet (10 mg total) by mouth daily. 90 tablet 1   guaiFENesin (MUCINEX) 600 MG 12 hr tablet Take 2 tablets (1,200 mg total) by mouth 2 (two) times daily as needed for to loosen phlegm.     isosorbide mononitrate (IMDUR) 30 MG 24 hr tablet Take 0.5 tablets (15 mg total) by mouth daily. 45 tablet 3   Lancets (ONETOUCH ULTRASOFT) lancets Use as instructed 100 each 12    lip balm (CARMEX) ointment Apply topically as needed for lip care. 7 g 0   metFORMIN (GLUCOPHAGE) 1000 MG tablet Take 1 tablet (1,000 mg total) by mouth 2 (two) times daily with a meal. 180 tablet 1   metoprolol tartrate (LOPRESSOR) 25 MG tablet Take 1 tablet (25 mg total) by mouth 2 (two) times daily. 60 tablet 6   nitroGLYCERIN (NITROSTAT) 0.4 MG SL tablet Place 1 tablet (0.4 mg total) under the tongue every 5 (five) minutes as needed for chest pain. 25 tablet 6   oxyCODONE (OXY IR/ROXICODONE) 5 MG immediate release tablet Take 1-2 tablets (5-10 mg total) by mouth every 4 (four) hours as needed for severe pain. 30 tablet 0   rosuvastatin (CRESTOR) 40 MG tablet Take 1 tablet (40 mg total) by mouth daily. 30 tablet 3   No current facility-administered medications on file prior to visit.     Allergies  Allergen Reactions   Penicillins Hives    Reaction: 15-20 years ago   Active Problems:   * No active hospital problems. *  Vitals:   06/25/21 1318  BP: 108/68  Pulse: 86  Resp: 20  SpO2: 96%     Cor: RRR,   no edema Pulm: CTA bilaterally and in all fields Abd: no tenderness Ext: no edema Wound: clean sternal incision, clean radial artery incision, EVH sites healing well.    CLINICAL DATA:  Status post CABG   EXAM: CHEST - 2 VIEW   COMPARISON:  Prior chest x-ray 04/27/2021   FINDINGS: Surgical changes of prior median sternotomy for multivessel CABG. The lungs are clear. No pulmonary edema, pleural effusion or pneumothorax. Significantly improved aeration compared to the prior chest x-ray.   IMPRESSION: No active cardiopulmonary disease.     Electronically Signed   By: Jacqulynn Cadet M.D.   On: 06/04/2021 13:13   Subjective Objective:  Assessment & Plan  Post-op CABG with overall good recovery. He has had some referred shoulder pain but this has improved. Sternum is stable to palpation without popping or clicking.  He is able to drive since he no longer needs  pain medication during the day. He can now drive at night.  Recommend cardiac rehab however, the wait is 6 months. Encouraged activity at home walking, biking, elliptical without the arms.  Can start to increase weight as able, only by 1-2 lbs a week.  Continue to wash incisions with soap and water. Keep blood glucose under 150 for optimal wound healing. He has been running 100-130s at home.  Recommend holding off on steroid injections for his shoulder post-op since he is immunocompromised.   Plan: Return to work paperwork discussed and will be faxed to his employer. He is discharged from our service. He will f/u with Cardiology. He is welcome to call our office with questions or concerns.     [name] 06/25/2021

## 2021-06-26 ENCOUNTER — Telehealth: Payer: Self-pay

## 2021-06-26 NOTE — Telephone Encounter (Signed)
Pt/wife informed of providers result & recommendations. Pt verbalized understanding.  They will keep a BP log  and call with ABN BP.

## 2021-06-26 NOTE — Telephone Encounter (Signed)
-----   Message from Ronney Asters, NP sent at 06/26/2021  6:37 AM EDT ----- Please contact Mr. Vogel and let him know that he may discontinue his Imdur medication.  Please remove the Imdur medication from his medication list.  Also please ask him to maintain a blood pressure log.  We would like his blood pressures to continue to be in the 120s-130s over 70s-80s.  If he notices blood pressures higher than this range please have him contact us so that we may further adjust his medication regimen.  Thank you,  Barry Schmidt  ----- Message ----- From: Bunnie Domino Sent: 06/25/2021   2:39 PM EDT To: Ronney Asters, NP  Frankey Shown,   I saw this patient today and they asked me about stopping his Imdur. From a surgical perspective he was just on this for radial artery spasm and can discontinue it. I didn't know if you were using it for BP control so I wanted to reach out. We have released him from our office since he looked really good today. I told them to continue it for now until I spoke with you.   Thank you!  Julian Hy

## 2021-07-04 ENCOUNTER — Telehealth (HOSPITAL_COMMUNITY): Payer: Self-pay

## 2021-07-04 NOTE — Telephone Encounter (Signed)
Called and spoke with pt in regards to CR, pt stated he is not able to participate at this time due to his work schedule.   Closed referral 

## 2021-07-11 ENCOUNTER — Telehealth: Payer: Self-pay | Admitting: General Practice

## 2021-07-11 NOTE — Telephone Encounter (Addendum)
Spoke to patient he stated he has noticed for the past 1 month when he lies down at night his heart races.He also is having headaches at night.No chest pain.No sob. Appointment scheduled with Joni Reining DNP 10/7 at 1:45 pm.

## 2021-07-11 NOTE — Progress Notes (Signed)
Cardiology Office Note   Date:  07/12/2021   ID:  Barry Schmidt, DOB 1961/03/21, MRN [id number]  PCP:  [name], MD  Cardiologist:  Dr. Claiborne Billings  CC: Shortness of Breath and palpitations   History of Present Illness: Barry Schmidt is a 60 y.o. male who presents for ongoing assessment and management of coronary artery disease with history of CABG x3 on 04/24/2021.  Other history includes diabetes mellitus, hyperlipidemia, frequent vertigo and staggering.  He was last seen in the office by Coletta Memos, nurse practitioner on 05/20/2021.  This was posthospitalization follow-up after surgery.  It was noted that the patient had some anxiety about getting in and out of the shower and had slowly increased his activity but was apprehensive about this.  It was also noted that he had continual headaches after taking metoprolol succinate.  Therefore metoprolol succinate was changed to metoprolol tartrate 25 mg twice daily.  He was advised to increase his activity.  He was without complaints of chest pain dyspnea or lower extremity edema.  The patient called our office on 07/11/2021 with complaints of lightheadedness, shortness of breath and frequent palpitations.  He also complained of some lower extremity edema in legs and feet.  He had gained approximately 3 pounds in 24 hours.  He reported that his weight increased from 210.4 to 213 pounds.  Appointment was made for further evaluation.  He comes today with his wife, with multiple complaints.  He states that when he lays down in bed he can feel his heart racing especially when he turns on his side but this also can occur when he is lying on his back.  The patient states that he can hear his heart beating in his ears.  This is quite disconcerting for him.  It eventually goes away after several minutes and he is able to sleep.    He also complains of lower extremity edema with heaviness, "feeling like my legs are made of concrete" making it difficult to walk.   He has some pain in the areas where he had had his vein harvest for bypass grafting.  He has gained approximately 6 to 8 pounds since discharge from the hospital.  When he was on diuretic he was able to maintain his weight and did not feel the edema in his lower extremities or discomfort.    He also complains of some pain substernally.  He is not yet gotten his energy back and has become quite impatient with himself about not being able to be as energetic as he was in the past.  He has returned to work and does not feel he is able to work at the level he had been prior to his surgery although he continues to finish each day.  His wife also states that he snores and quits breathing and breathes through his mouth a lot.  She states that she has to remind him to close his mouth in order to keep his oxygen level up.  He has not been evaluated for sleep apnea yet.  They have also noticed that his blood glucose has been abnormal since being started [place].  He continues to monitor his blood glucose and does have a follow-up appointment with PCP.   Past Medical History:  Diagnosis Date   Diabetes mellitus without complication ([place])    On statin therapy due to risk of future cardiovascular event 10/14/2018   S/P CABG x 3, 04/2021    Uncontrolled diabetes mellitus without complication, with long-term  current use of insulin 06/23/2018   Diagnosed in 2007; was morbidly obese at that time 260 pounds. He lost weight and did well.     Past Surgical History:  Procedure Laterality Date   CORONARY ARTERY BYPASS GRAFT N/A 04/25/2021   Procedure: CORONARY ARTERY BYPASS GRAFTING (CABG)x 3 ON CARDIOPULMONARY BYPASS USING LIMA, LEFT RADIAL ARTERY AND  GSV.;  Surgeon: Melrose Nakayama, MD;  Location: [place];  Service: Open Heart Surgery;  Laterality: N/A;   ENDOVEIN HARVEST OF GREATER SAPHENOUS VEIN Bilateral 04/25/2021   Procedure: ENDOVEIN HARVEST OF GREATER SAPHENOUS VEIN;  Surgeon: Melrose Nakayama, MD;   Location: [place];  Service: Open Heart Surgery;  Laterality: Bilateral;   LEFT HEART CATH AND CORONARY ANGIOGRAPHY N/A 04/24/2021   Procedure: LEFT HEART CATH AND CORONARY ANGIOGRAPHY;  Surgeon: Troy Sine, MD;  Location: [facility] [facility];  Service: Cardiovascular;  Laterality: N/A;   RADIAL ARTERY HARVEST Left 04/25/2021   Procedure: RADIAL ARTERY HARVEST;  Surgeon: Melrose Nakayama, MD;  Location: [place];  Service: Open Heart Surgery;  Laterality: Left;   TEE WITHOUT CARDIOVERSION N/A 04/25/2021   Procedure: TRANSESOPHAGEAL ECHOCARDIOGRAM (TEE);  Surgeon: Melrose Nakayama, MD;  Location: [place];  Service: Open Heart Surgery;  Laterality: N/A;     Current Outpatient Medications  Medication Sig Dispense Refill   ALPRAZolam (XANAX) 0.25 MG tablet Take 1 tablet (0.25 mg total) by mouth at bedtime as needed for anxiety. 30 tablet 0   furosemide (LASIX) 20 MG tablet Take 1 tablet (20 mg total) by mouth as needed. 30 tablet 1   acetaminophen (TYLENOL) 325 MG tablet Take 2 tablets (650 mg total) by mouth every 6 (six) hours as needed for mild pain.     aspirin EC 81 MG tablet Take 81 mg by mouth at bedtime.     Blood Glucose Monitoring Suppl (ONE TOUCH ULTRA 2) w/Device KIT 1 kit by Does not apply route 2 (two) times daily. 1 each 0   dapagliflozin propanediol (FARXIGA) 10 MG TABS tablet Take 1 tablet (10 mg total) by mouth daily. 90 tablet 1   glucose blood (ACCU-CHEK AVIVA PLUS) test strip USE AS INSTRUCTED TO TEST BLOOD GLUCOSE 100 each 12   Lancets (ONETOUCH ULTRASOFT) lancets Use as instructed 100 each 12   metFORMIN (GLUCOPHAGE) 1000 MG tablet Take 1 tablet (1,000 mg total) by mouth 2 (two) times daily with a meal. 180 tablet 1   metoprolol tartrate (LOPRESSOR) 25 MG tablet Take 1 tablet (25 mg total) by mouth 2 (two) times daily. 60 tablet 6   nitroGLYCERIN (NITROSTAT) 0.4 MG SL tablet Place 1 tablet (0.4 mg total) under the tongue every 5 (five) minutes as needed for chest pain. 25  tablet 6   rosuvastatin (CRESTOR) 40 MG tablet Take 1 tablet (40 mg total) by mouth at bedtime. 90 tablet 3   No current facility-administered medications for this visit.    Allergies:   Penicillins    Social History:  The patient  reports that he quit smoking about 20 months ago. His smoking use included cigarettes. He has never used smokeless tobacco. He reports current alcohol use. He reports that he does not use drugs.   Family History:  The patient's family history includes Diabetes in his brother, brother, and sister; Diabetes type I in his father; Healthy in his son and son; Heart attack in his father; Heart disease in his mother; Hypertension in his mother.    ROS: All other systems  are reviewed and negative. Unless otherwise mentioned in H&P    PHYSICAL EXAM: VS:  BP 124/78   Pulse 66   Ht 5' 8"  (1.727 m)   Wt 216 lb 12.8 oz (98.3 kg)   SpO2 95%   BMI 32.96 kg/m  , BMI Body mass index is 32.96 kg/m. GEN: Well nourished, well developed, in no acute distress HEENT: normal Neck: no JVD, carotid bruits, or masses Cardiac: RRR; no murmurs, rubs, or gallops,, no racing heart rate or palpitations or irregular heart rhythm with position changes during examination.  1+ pretibial edema  Respiratory:  Clear to auscultation bilaterally, normal work of breathing GI: soft, nontender, nondistended, + BS MS: no deformity or atrophy, well-healed radial artery incision on the left, well-healed saphenous vein harvest graft bilaterally with some scabbing noted at incision sites.  Well-healed mid sternotomy incision. Skin: warm and dry, no rash Neuro:  Strength and sensation are intact Psych: euthymic mood, anxiety with some mild depression is noted during conversation.  He has concerns about whether or not the surgery and bypass grafting is healing appropriately because of his symptoms.  EKG:  EKG is ordered today. The ekg ordered today demonstrates (personally reviewed) normal sinus  rhythm with incomplete right bundle branch block with nonspecific T wave abnormalities, he is unchanged from prior EKG in July 2022.   Recent Labs: 05-10-2021: TSH 3.690 04/25/2021: ALT 19 04/26/2021: Magnesium 2.3 04/27/2021: Hemoglobin 9.5; Platelets 132 06/18/2021: BUN 18; Creatinine, Ser 0.79; Potassium 4.5; Sodium 140    Lipid Panel    Component Value Date/Time   CHOL 110 04/02/2021 1219   TRIG 87.0 04/02/2021 1219   HDL 51.10 04/02/2021 1219   CHOLHDL 2 04/02/2021 1219   VLDL 17.4 04/02/2021 1219   LDLCALC 42 04/02/2021 1219   LDLDIRECT 90.0 06/23/2018 1113      Wt Readings from Last 3 Encounters:  07/12/21 216 lb 12.8 oz (98.3 kg)  06/25/21 212 lb (96.2 kg)  06/25/21 212 lb 9.6 oz (96.4 kg)      Other studies Reviewed: Echocardiogram May 10, 2020   1. Left ventricular ejection fraction, by estimation, is 55 to 60%. The  left ventricle has normal function. The left ventricle has no regional  wall motion abnormalities. Left ventricular diastolic parameters were  normal. There is hypokinesis of the left   ventricular, apical segment.   2. Right ventricular systolic function is normal. The right ventricular  size is normal. Tricuspid regurgitation signal is inadequate for assessing  PA pressure.   3. The mitral valve is normal in structure. Trivial mitral valve  regurgitation. No evidence of mitral stenosis.   4. The aortic valve is tricuspid. Aortic valve regurgitation is not  visualized. Mild aortic valve sclerosis is present, with no evidence of  aortic valve stenosis.   5. The inferior vena cava is normal in size with <50% respiratory  variability, suggesting right atrial pressure of 8 mmHg.  Left Heart Catheterization: 04/24/2021 Prox RCA lesion is 70% stenosed.   RPAV lesion is 50% stenosed.   Dist RCA lesion is 50% stenosed.   Ost LAD to Prox LAD lesion is 100% stenosed.   Mid LM to Dist LM lesion is 85% stenosed.   Ramus lesion is 80% stenosed.   Ost Cx  lesion is 70% stenosed.   Mid Cx to Dist Cx lesion is 50% stenosed.   There is moderate left ventricular systolic dysfunction.   LV end diastolic pressure is normal.   Severe multivessel CAD with 85% on  distal left main stenosis; total ostial occlusion of a large LAD; 80% ostial ramus intermediate stenosis and 70% ostial circumflex stenosis.  There is 50% narrowing in the mid left circumflex after marginal branch.  There is significant dampening with each injection into the RCA.  There is 70% proximal RCA stenoses with 50% irregularity distally.  There is extensive collateralization from the distal RCA via septal collaterals supplying the LAD.   RECOMMENDATION: Patient will be admitted with plans for surgical consultation for CABG revascularization surgery due to high-grade left main stenosis.    ASSESSMENT AND PLAN:  1.  Coronary artery disease: Status post coronary artery bypass grafting, with LIMA, left radial artery, and greater saphenous vein.LIMA to LAD, left radial artery to OM2, and svg to PDA.  He underwent open left radial artery harvest and endoscopic harvest of greater saphenous vein from his right and left thigh . He has significant anxiety concerning his recovery and symptoms post operatively.  He is given reassurance and explanation of his normal post operative recovery course. I have answered multiple questions.   2. Palpitations: Feels him temporarily when he lays flat in bed or turns on his left side.  We will continue him on metoprolol 25 mg twice daily.  We may have to increase his dose if he continues to have the symptoms however, when he checks his watch to see how fast his heart rate is going it is usually within a normal range.  I am going to see him again in 2 weeks.  Symptoms still are persistent cardiac monitor morphology duration's palpitations.  3.  Venous insufficiency: Likely to bilateral saphenous vein graft harvesting.  I have given him a prescription for Lasix 20 mg  to take as needed lower extremity edema, suggested some compression socks to help with his feelings of heaviness in his legs, and low-sodium diet.  I will see him in a couple weeks to evaluate how he has responded to this medication.  4.  Hypercholesterolemia: The patient will need to remain on statin therapy to keep LDL less than 70.  Can follow-up with him concerning labs if not completed by PCP.  5.  Situational anxiety/depression: He has significant concerns about his postoperative recovery time and symptoms.  I have explained to him that this is very normal to have after a major heart surgery.  I have given him a prescription for Xanax 0.5 mg for him to take as needed especially before bedtime as this appears to be the time when he is having his palpitations and anxiety most. Although he admits to significant anxiety while trying to complete his tasks at work as he is not feeling a strong as he wants to be.  I will see him again in 2 weeks to evaluate his response to medication.  It may be helpful for him to be on this longer term and I would defer this to his primary care physician.  Also if his anxiety continues to be an issue for him may need to consider psychiatric counseling for short-term management of symptoms until he fully recovers..  6.  Possible obstructive sleep apnea: He admits to snoring and his wife states that he stops breathing at times.  This may need to be evaluated further after he has recovered from his surgery.  Will defer this to a later date unless this becomes more of an issue for him, causing significant fatigue and desaturations.  7.  Type 2 diabetes: Followed by primary care.  Adjustment in  metformin and [place] Glade will be deferred to primary care.  Current medicines are reviewed at length with the patient today.  I have spent 45 minuets  dedicated to the care of this patient on the date of this encounter to include pre-visit review of records, assessment, management and  diagnostic testing,with shared decision making.  Labs/ tests ordered today include: BMET  Phill Myron. West Pugh, ANP, AACC   07/12/2021 3:54 PM    [facility] [place] [address] [place] [address] Office [phone] Fax [phone]  Notice: This dictation was prepared with Dragon dictation along with smaller phrase technology. Any transcriptional errors that result from this process are unintentional and may not be corrected upon review.

## 2021-07-11 NOTE — Telephone Encounter (Signed)
  Patient c/o Palpitations:  High priority if patient c/o lightheadedness, shortness of breath, or chest pain  How long have you had palpitations/irregular HR/ Afib? Are you having the symptoms now? No  Are you currently experiencing lightheadedness, SOB or CP? Headache   Do you have a history of afib (atrial fibrillation) or irregular heart rhythm? No   Have you checked your BP or HR? (document readings if available): 138/82, 118/76, HR 94, 76  Are you experiencing any other symptoms? Pt's wife said pt been feeling fast heart beat especially when laying down at night. She said pt can feel and hear his heart beating, she is very concern and ask if Verdon Cummins would like to order heart monitor for the pt to check what's going on. She said to call pt since she will be doing some errands

## 2021-07-12 ENCOUNTER — Encounter: Payer: Self-pay | Admitting: Adult Health

## 2021-07-12 ENCOUNTER — Other Ambulatory Visit: Payer: Self-pay

## 2021-07-12 ENCOUNTER — Ambulatory Visit (INDEPENDENT_AMBULATORY_CARE_PROVIDER_SITE_OTHER): Payer: BC Managed Care – PPO | Admitting: Adult Health

## 2021-07-12 VITALS — BP 124/78 | HR 66 | Ht 68.0 in | Wt 216.8 lb

## 2021-07-12 DIAGNOSIS — Z951 Presence of aortocoronary bypass graft: Secondary | ICD-10-CM

## 2021-07-12 DIAGNOSIS — F4323 Adjustment disorder with mixed anxiety and depressed mood: Secondary | ICD-10-CM | POA: Diagnosis not present

## 2021-07-12 DIAGNOSIS — G4730 Sleep apnea, unspecified: Secondary | ICD-10-CM

## 2021-07-12 DIAGNOSIS — R609 Edema, unspecified: Secondary | ICD-10-CM | POA: Diagnosis not present

## 2021-07-12 DIAGNOSIS — E7800 Pure hypercholesterolemia, unspecified: Secondary | ICD-10-CM

## 2021-07-12 DIAGNOSIS — I2510 Atherosclerotic heart disease of native coronary artery without angina pectoris: Secondary | ICD-10-CM

## 2021-07-12 DIAGNOSIS — E119 Type 2 diabetes mellitus without complications: Secondary | ICD-10-CM

## 2021-07-12 MED ORDER — FUROSEMIDE 20 MG PO TABS: 20.0000 mg | ORAL_TABLET | ORAL | 1 refills | Status: DC | PRN

## 2021-07-12 MED ORDER — ALPRAZOLAM ER 0.5 MG PO TB24: 0.5000 mg | ORAL_TABLET | Freq: Every day | ORAL | Status: DC

## 2021-07-12 MED ORDER — ALPRAZOLAM 0.25 MG PO TABS
0.2500 mg | ORAL_TABLET | Freq: Every evening | ORAL | 0 refills | Status: DC | PRN
Start: 2021-07-12 — End: 2021-08-02

## 2021-07-12 NOTE — Patient Instructions (Signed)
Medication Instructions:  Start Lasix 20 mg ( Take 1 Tablet as Needed for Swelling and Edema). Start Xanax 0.25 mg ( Take 1 Tablet at Bedtime as Needed). *If you need a refill on your cardiac medications before your next appointment, please call your pharmacy*   Lab Work: BMET : Today If you have labs (blood work) drawn today and your tests are completely normal, you will receive your results only by: MyChart Message (if you have MyChart) OR A paper copy in the mail If you have any lab test that is abnormal or we need to change your treatment, we will call you to review the results.   Testing/Procedures: No Testing    Follow-Up: At [facility], you and your health needs are our priority.  As part of our continuing mission to provide you with exceptional heart care, we have created designated Provider Care Teams.  These Care Teams include your primary Cardiologist (physician) and Advanced Practice Providers (APPs -  Physician Assistants and Nurse Practitioners) who all work together to provide you with the care you need, when you need it.  We recommend signing up for the patient portal called "MyChart".  Sign up information is provided on this After Visit Summary.  MyChart is used to connect with patients for Virtual Visits (Telemedicine).  Patients are able to view lab/test results, encounter notes, upcoming appointments, etc.  Non-urgent messages can be sent to your provider as well.   To learn more about what you can do with MyChart, go to [email or web address].    Your next appointment:   2 week(s)  The format for your next appointment:   In Person  Provider:   Joni Reining, DNP, ANP

## 2021-07-13 LAB — BASIC METABOLIC PANEL
BUN/Creatinine Ratio: 16 (ref 9–20)
BUN: 14 mg/dL (ref 6–24)
CO2: 22 mmol/L (ref 20–29)
Calcium: 9.5 mg/dL (ref 8.7–10.2)
Chloride: 101 mmol/L (ref 96–106)
Creatinine, Ser: 0.9 mg/dL (ref 0.76–1.27)
Glucose: 119 mg/dL — ABNORMAL HIGH (ref 70–99)
Potassium: 4.7 mmol/L (ref 3.5–5.2)
Sodium: 139 mmol/L (ref 134–144)
eGFR: 99 mL/min/{1.73_m2} (ref >59)

## 2021-07-15 ENCOUNTER — Ambulatory Visit (HOSPITAL_BASED_OUTPATIENT_CLINIC_OR_DEPARTMENT_OTHER): Payer: BC Managed Care – PPO | Admitting: Family

## 2021-07-15 ENCOUNTER — Telehealth: Payer: Self-pay

## 2021-07-15 NOTE — Telephone Encounter (Addendum)
Called patient to advise blood test normal.----- Message from Jodelle Gross, NP sent at 07/13/2021  1:05 PM EDT ----- I have reviewed his labs. All are normal no concerns here.   Samara Deist

## 2021-07-22 ENCOUNTER — Ambulatory Visit
Admission: RE | Admit: 2021-07-22 | Discharge: 2021-07-22 | Disposition: A | Discharge status: Home / Self Care | Payer: BC Managed Care – PPO | Source: Ambulatory Visit | Attending: Thoracic Surgery (Cardiothoracic Vascular Surgery) | Admitting: Thoracic Surgery (Cardiothoracic Vascular Surgery)

## 2021-07-22 ENCOUNTER — Other Ambulatory Visit: Payer: Self-pay | Admitting: Thoracic Surgery (Cardiothoracic Vascular Surgery)

## 2021-07-22 ENCOUNTER — Other Ambulatory Visit: Payer: Self-pay

## 2021-07-22 ENCOUNTER — Telehealth: Payer: Self-pay | Admitting: Cardiovascular Disease

## 2021-07-22 DIAGNOSIS — Z951 Presence of aortocoronary bypass graft: Secondary | ICD-10-CM

## 2021-07-22 NOTE — Telephone Encounter (Signed)
Returned call to patient's wife who states the patient has chest tenderness in midsternal region and left side. States when she called this morning she thought the patient's injury occurred today but he reports he twisted his chest 2 days ago when he almost slipped in the shower. She asks if patient should get an x-ray. I advised that some tenderness in the soft tissue of the chest may occur in relation to the way he twisted but if he is having pain in the sternum where his CABG incision is located, that he should call [facility] for advice. Patient's wife verbalized understanding and agreement and thanked me for the call.

## 2021-07-22 NOTE — Telephone Encounter (Signed)
   My husband slipped in the shower and twisted to catch himself.. he didnt fall completely but is now having pain in his chest.. pts wife would like to know if he should have an x-ray done or what would you recommend?

## 2021-07-26 ENCOUNTER — Ambulatory Visit: Payer: BC Managed Care – PPO | Admitting: Cardiovascular Disease

## 2021-07-26 ENCOUNTER — Telehealth (INDEPENDENT_AMBULATORY_CARE_PROVIDER_SITE_OTHER): Payer: BC Managed Care – PPO | Admitting: Family Medicine

## 2021-07-26 ENCOUNTER — Encounter: Payer: Self-pay | Admitting: Family Medicine

## 2021-07-26 VITALS — Temp 99.0°F | Wt 211.4 lb

## 2021-07-26 DIAGNOSIS — J0101 Acute recurrent maxillary sinusitis: Secondary | ICD-10-CM

## 2021-07-26 MED ORDER — AZITHROMYCIN 250 MG PO TABS: ORAL_TABLET | ORAL | 0 refills | Status: DC

## 2021-07-26 NOTE — Progress Notes (Signed)
Virtual Visit via Video Note  Subjective  CC:  Chief Complaint  Patient presents with   Sinus Problem    Ongoing for almost 4 weeks, was seen at urgent care. Green mucus     I connected with Tia Alert on 07/26/21 at 11:30 AM EDT by a video enabled telemedicine application and verified that I am speaking with the correct person using two identifiers. Location patient: Home Location provider: [facility] [place], Office Persons participating in the virtual visit: Barry Schmidt, [name], MD [place]  I discussed the limitations of evaluation and management by telemedicine and the availability of in person appointments. The patient expressed understanding and agreed to proceed. HPI: Barry Schmidt is a 60 y.o. male who was contacted today to address the problems listed above in the chief complaint. 59 yo diabetic with CAD had sinus infection at end of September treated with doxy x 10 days. I reviewed UC notes. Sxs resolved; however, has same sxs x last 6-7 days again: sinus pain, purulent PND, mild cough headache w/o fever or sob. Hasn't tested for covid. No cp.   Assessment  1. Acute recurrent maxillary sinusitis      Plan  Sinus infection:  test for covid; if negative take zpak. Supportive care. Monitor for worsening sxs. Monitor sugars.   I discussed the assessment and treatment plan with the patient. The patient was provided an opportunity to ask questions and all were answered. The patient agreed with the plan and demonstrated an understanding of the instructions.   The patient was advised to call back or seek an in-person evaluation if the symptoms worsen or if the condition fails to improve as anticipated. Follow up: as scheduled.   10/01/2021  Meds ordered this encounter  Medications   azithromycin (ZITHROMAX) 250 MG tablet    Sig: Take 2 tabs today, then 1 tab daily for 4 days    Dispense:  1 each    Refill:  0      I  reviewed the patients updated PMH, FH, and SocHx.    Patient Active Problem List   Diagnosis Date Noted   S/P CABG x 3, 04/2021     Priority: 1.   CAD (coronary artery disease), native coronary artery 04/24/2021    Priority: 1.   Former smoker 04/02/2021    Priority: 1.   Controlled type 2 diabetes mellitus with neuropathy ([place]) 10/14/2018    Priority: 1.   Combined hyperlipidemia associated with type 2 diabetes mellitus ([place]) 10/14/2018    Priority: 1.   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   Femoral neuropathy of right lower extremity 10/14/2018    Priority: 2.   Current Meds  Medication Sig   acetaminophen (TYLENOL) 325 MG tablet Take 2 tablets (650 mg total) by mouth every 6 (six) hours as needed for mild pain.   ALPRAZolam (XANAX) 0.25 MG tablet Take 1 tablet (0.25 mg total) by mouth at bedtime as needed for anxiety.   aspirin EC 81 MG tablet Take 81 mg by mouth at bedtime.   azithromycin (ZITHROMAX) 250 MG tablet Take 2 tabs today, then 1 tab daily for 4 days   Blood Glucose Monitoring Suppl (ONE TOUCH ULTRA 2) w/Device KIT 1 kit by Does not apply route 2 (two) times daily.   dapagliflozin propanediol (FARXIGA) 10 MG TABS tablet Take 1 tablet (10 mg total) by mouth daily.   furosemide (LASIX)  20 MG tablet Take 1 tablet (20 mg total) by mouth as needed.   glucose blood (ACCU-CHEK AVIVA PLUS) test strip USE AS INSTRUCTED TO TEST BLOOD GLUCOSE   Lancets (ONETOUCH ULTRASOFT) lancets Use as instructed   metFORMIN (GLUCOPHAGE) 1000 MG tablet Take 1 tablet (1,000 mg total) by mouth 2 (two) times daily with a meal.   metoprolol tartrate (LOPRESSOR) 25 MG tablet Take 1 tablet (25 mg total) by mouth 2 (two) times daily.   rosuvastatin (CRESTOR) 40 MG tablet Take 1 tablet (40 mg total) by mouth at bedtime.    Allergies: Patient is allergic to penicillins. Family History: Patient family history includes Diabetes in his brother, brother, and sister; Diabetes  type I in his father; Healthy in his son and son; Heart attack in his father; Heart disease in his mother; Hypertension in his mother. Social History:  Patient  reports that he quit smoking about 20 months ago. His smoking use included cigarettes. He has never used smokeless tobacco. He reports current alcohol use. He reports that he does not use drugs.  Review of Systems: Constitutional: Negative for fever malaise or anorexia Cardiovascular: negative for chest pain Respiratory: negative for SOB or persistent cough Gastrointestinal: negative for abdominal pain  OBJECTIVE Vitals: Temp 99 F (37.2 C)   Wt 211 lb 6.4 oz (95.9 kg)   BMI 32.14 kg/m  General: no acute distress , A&Ox3, points to max sinuses when describing pain and frontal headache. No respiratory distress.  [name], MD

## 2021-08-01 NOTE — Progress Notes (Signed)
Cardiology Office Note   Date:  08/02/2021   ID:  Barry Schmidt, DOB [birth year], MRN [id number]  PCP:  [name], MD  Cardiologist:  Dr. Claiborne Billings  CC: 2-week follow-up status post CABG    History of Present Illness: Barry Schmidt is a 60 y.o. male who presents for  ongoing assessment and management of coronary artery disease with history of CABG x3 on 04/24/2021.  Other history includes diabetes mellitus, hyperlipidemia, frequent vertigo and staggering.  It was noted that the patient had some anxiety about getting in and out of the shower and had slowly increased his activity but was apprehensive about this.  It was also noted that he had continual headaches after taking metoprolol succinate.  Therefore metoprolol succinate was changed to metoprolol tartrate 25 mg twice daily.  Was last seen in the office on 07/12/2021 with multiple complaints.  He stated that when he lays down in his bed he can feel his heart racing especially when he turns on his side but can also hear this when he is lying on his back.  He complained of lower extremity edema and heaviness "feeling like my legs are made of concrete" making it difficult to walk.  He continued to have postoperative pain.  He was also noted that he snores and quits breathing and breathes through his mouth a lot according to his wife.  He also complained that his blood glucose has been abnormal since starting on Farxiga and was to follow-up with PCP.  At that office visit a great deal of reassurance was given to the patient concerning postoperative symptoms and that this was a part of normal recovery.  He was to continue on metoprolol 25 mg twice daily with consideration for increasing the dose if he continues to have symptoms.  He admitted that when he checks his watch when he feels his heart racing it is usually within a normal range.  I would consider a cardiac monitor on follow-up if he continues to have these complaints.  He was given a  prescription for Lasix 20 mg to take as needed for lower extremity edema and I suggested compression socks to help with feelings of heaviness in his legs and a low-sodium diet.  For situational anxiety and depression he was given a prescription for Xanax 0.5 mg to take as needed, especially before bedtime as this appears to be the time when he is having the most anxiety, palpitations, and somatic symptoms.  He was seen by primary care provider Dr. [name] through video visit, on 07/26/2021 and treated for sinus infection.  He was tested for COVID.  He was started on a Z-Pak. He states that he almost fell in the shower recently and stopped himself up but did strain his chest and did see [facility] for evaluation as he experienced some discomfort substernally.  Chest x-ray was completed and it was found to be normal without any evidence of damage to his sternum or wires.  It was felt that he had pulled a muscle.  He comes today with continued complaints of anxiety and stress.  He feels that it work, his wife states that when he went to bed one evening he had a "panic attack" and she had to give him an additional Xanax to calm him down.  Apparently the Xanax was ordered at 0.25 mg as needed instead of 0.5 mg.  His wife also complains that he sometimes zones out during dinner and they have trouble getting his attention.  He  and his wife also have concerns about him having what appears to be apnea at night.  He no longer has complaints of palpitations when he is stressed and the increased dose of metoprolol has been helpful to him  Past Medical History:  Diagnosis Date   Diabetes mellitus without complication ([place])    On statin therapy due to risk of future cardiovascular event 10/14/2018   S/P CABG x 3, 04/2021    Uncontrolled diabetes mellitus without complication, with long-term current use of insulin 06/23/2018   Diagnosed in 2007; was morbidly obese at that time 260 pounds. He lost weight and did well.      Past Surgical History:  Procedure Laterality Date   CORONARY ARTERY BYPASS GRAFT N/A 04/25/2021   Procedure: CORONARY ARTERY BYPASS GRAFTING (CABG)x 3 ON CARDIOPULMONARY BYPASS USING [place], LEFT RADIAL ARTERY AND  GSV.;  Surgeon: Melrose Nakayama, MD;  Location: [place];  Service: Open Heart Surgery;  Laterality: N/A;   ENDOVEIN HARVEST OF GREATER SAPHENOUS VEIN Bilateral 04/25/2021   Procedure: ENDOVEIN HARVEST OF GREATER SAPHENOUS VEIN;  Surgeon: Melrose Nakayama, MD;  Location: [place];  Service: Open Heart Surgery;  Laterality: Bilateral;   LEFT HEART CATH AND CORONARY ANGIOGRAPHY N/A 04/24/2021   Procedure: LEFT HEART CATH AND CORONARY ANGIOGRAPHY;  Surgeon: Troy Sine, MD;  Location: [place] [facility];  Service: Cardiovascular;  Laterality: N/A;   RADIAL ARTERY HARVEST Left 04/25/2021   Procedure: RADIAL ARTERY HARVEST;  Surgeon: Melrose Nakayama, MD;  Location: [place];  Service: Open Heart Surgery;  Laterality: Left;   TEE WITHOUT CARDIOVERSION N/A 04/25/2021   Procedure: TRANSESOPHAGEAL ECHOCARDIOGRAM (TEE);  Surgeon: Melrose Nakayama, MD;  Location: [place];  Service: Open Heart Surgery;  Laterality: N/A;     Current Outpatient Medications  Medication Sig Dispense Refill   acetaminophen (TYLENOL) 325 MG tablet Take 2 tablets (650 mg total) by mouth every 6 (six) hours as needed for mild pain.     ALPRAZolam (XANAX) 0.5 MG tablet Take 1 tablet (0.5 mg total) by mouth at bedtime as needed for anxiety. 30 tablet 0   aspirin EC 81 MG tablet Take 81 mg by mouth at bedtime.     azithromycin (ZITHROMAX) 250 MG tablet Take 2 tabs today, then 1 tab daily for 4 days 1 each 0   Blood Glucose Monitoring Suppl (ONE TOUCH ULTRA 2) w/Device KIT 1 kit by Does not apply route 2 (two) times daily. 1 each 0   dapagliflozin propanediol (FARXIGA) 10 MG TABS tablet Take 1 tablet (10 mg total) by mouth daily. 90 tablet 1   furosemide (LASIX) 20 MG tablet Take 1 tablet (20 mg total) by  mouth as needed. 30 tablet 1   glucose blood (ACCU-CHEK AVIVA PLUS) test strip USE AS INSTRUCTED TO TEST BLOOD GLUCOSE 100 each 12   Lancets (ONETOUCH ULTRASOFT) lancets Use as instructed 100 each 12   metFORMIN (GLUCOPHAGE) 1000 MG tablet Take 1 tablet (1,000 mg total) by mouth 2 (two) times daily with a meal. 180 tablet 1   metoprolol tartrate (LOPRESSOR) 25 MG tablet Take 1 tablet (25 mg total) by mouth 2 (two) times daily. 60 tablet 6   rosuvastatin (CRESTOR) 40 MG tablet Take 1 tablet (40 mg total) by mouth at bedtime. 90 tablet 3   nitroGLYCERIN (NITROSTAT) 0.4 MG SL tablet Place 1 tablet (0.4 mg total) under the tongue every 5 (five) minutes as needed for chest pain. 25 tablet 6   No  current facility-administered medications for this visit.    Allergies:   Penicillins    Social History:  The patient  reports that he quit smoking about 20 months ago. His smoking use included cigarettes. He has never used smokeless tobacco. He reports current alcohol use. He reports that he does not use drugs.   Family History:  The patient's family history includes Diabetes in his brother, brother, and sister; Diabetes type I in his father; Healthy in his son and son; Heart attack in his father; Heart disease in his mother; Hypertension in his mother.    ROS: All other systems are reviewed and negative. Unless otherwise mentioned in H&P    PHYSICAL EXAM: VS:  BP 140/60   Pulse 72   Ht 5' 8"  (1.727 m)   Wt 214 lb 6.4 oz (97.3 kg)   SpO2 96%   BMI 32.60 kg/m  , BMI Body mass index is 32.6 kg/m. GEN: Well nourished, well developed, in no acute distress HEENT: normal, xanthomas is noted. Neck: no JVD, carotid bruits, or masses Cardiac: RRR; no murmurs, rubs, or gallops,no edema  Respiratory:  Clear to auscultation bilaterally, normal work of breathing GI: soft, nontender, nondistended, + BS MS: no deformity or atrophy, sternotomy incision is healing very well without evidence of infection.   Incision in the legs bilaterally are healing well with scabbing noted.  Incision on the left arm is healing very well without evidence of infection. Skin: warm and dry, no rash Neuro:  Strength and sensation are intact Psych: euthymic mood, full affect   EKG:  EKG is not ordered today.    Recent Labs: 04-24-21: TSH 3.690 04/25/2021: ALT 19 04/26/2021: Magnesium 2.3 04/27/2021: Hemoglobin 9.5; Platelets 132 07/12/2021: BUN 14; Creatinine, Ser 0.90; Potassium 4.7; Sodium 139    Lipid Panel    Component Value Date/Time   CHOL 110 04/02/2021 1219   TRIG 87.0 04/02/2021 1219   HDL 51.10 04/02/2021 1219   CHOLHDL 2 04/02/2021 1219   VLDL 17.4 04/02/2021 1219   LDLCALC 42 04/02/2021 1219   LDLDIRECT 90.0 06/23/2018 1113      Wt Readings from Last 3 Encounters:  08/02/21 214 lb 6.4 oz (97.3 kg)  07/26/21 211 lb 6.4 oz (95.9 kg)  07/12/21 216 lb 12.8 oz (98.3 kg)      Other studies Reviewed: Echocardiogram Apr 24, 2020   1. Left ventricular ejection fraction, by estimation, is 55 to 60%. The  left ventricle has normal function. The left ventricle has no regional  wall motion abnormalities. Left ventricular diastolic parameters were  normal. There is hypokinesis of the left   ventricular, apical segment.   2. Right ventricular systolic function is normal. The right ventricular  size is normal. Tricuspid regurgitation signal is inadequate for assessing  PA pressure.   3. The mitral valve is normal in structure. Trivial mitral valve  regurgitation. No evidence of mitral stenosis.   4. The aortic valve is tricuspid. Aortic valve regurgitation is not  visualized. Mild aortic valve sclerosis is present, with no evidence of  aortic valve stenosis.   5. The inferior vena cava is normal in size with <50% respiratory  variability, suggesting right atrial pressure of 8 mmHg.   Left Heart Catheterization: 04/24/2021 Prox RCA lesion is 70% stenosed.   RPAV lesion is 50% stenosed.    Dist RCA lesion is 50% stenosed.   Ost LAD to Prox LAD lesion is 100% stenosed.   Mid LM to Dist LM lesion is 85% stenosed.  Ramus lesion is 80% stenosed.   Ost Cx lesion is 70% stenosed.   Mid Cx to Dist Cx lesion is 50% stenosed.   There is moderate left ventricular systolic dysfunction.   LV end diastolic pressure is normal.   Severe multivessel CAD with 85% on distal left main stenosis; total ostial occlusion of a large LAD; 80% ostial ramus intermediate stenosis and 70% ostial circumflex stenosis.  There is 50% narrowing in the mid left circumflex after marginal branch.  There is significant dampening with each injection into the RCA.  There is 70% proximal RCA stenoses with 50% irregularity distally.  There is extensive collateralization from the distal RCA via septal collaterals supplying the LAD.   RECOMMENDATION: Patient will be admitted with plans for surgical consultation for CABG revascularization surgery due to high-grade left main stenosis   ASSESSMENT AND PLAN:  CAD status post CABG: Overall general improvement in his symptoms concerning discomfort in his chest, arms and legs from incisions.  Palpitations have improved with higher dose of metoprolol.  Continue secondary management and follow-up with Dr. Claiborne Billings in January 2023.  2.  Hypercholesterolemia: Continue on rosuvastatin 40 mg daily with goal of LDL less than 70.  This can be reassessed when seen by Dr. Claiborne Billings in January 2023  3.  Probable obstructive sleep apnea: Frequent episodes where his wife reports that he stops breathing, and gasps.  I will order a home sleep study to evaluate further.  He will be contacted by Barry Brunner, CMA, to set this up for him.  He will be seeing Dr. Claiborne Billings who can also help manage this.  4.  Situational anxiety and depression: I have talked with him about referral to psychiatry as they can better manage the symptoms with pharmacological interventions and possible therapy if they deem this  appropriate. This is out of my scope of practice and experience.    Both he and his wife are in agreement with this plan.  They will check with their insurance company to see which psychiatrists are within their network, let us know so that we can refer him.  Xanax dose increased to 0.5 mg as needed at at bedtime.  No refills provided.  Would not recommend him taking Xanax during the day to avoid sleepiness.  Will defer to psychiatry for their recommendations concerning this.   Current medicines are reviewed at length with the patient today.  I have spent 30 minutes dedicated to the care of this patient on the date of this encounter to include pre-visit review of records, assessment, management and diagnostic testing,with shared decision making.  Labs/ tests ordered today include: None.  Referral for Home Sleep Study and Referral to Psychiatry.   Phill Myron. West Pugh, ANP, AACC   08/02/2021 3:42 PM    [facility] [place] [address] Office [phone] Fax [phone]  Notice: This dictation was prepared with Dragon dictation along with smaller phrase technology. Any transcriptional errors that result from this process are unintentional and may not be corrected upon review.

## 2021-08-02 ENCOUNTER — Encounter: Payer: Self-pay | Admitting: Adult Health

## 2021-08-02 ENCOUNTER — Ambulatory Visit (INDEPENDENT_AMBULATORY_CARE_PROVIDER_SITE_OTHER): Payer: BC Managed Care – PPO | Admitting: Adult Health

## 2021-08-02 ENCOUNTER — Other Ambulatory Visit: Payer: Self-pay

## 2021-08-02 VITALS — BP 140/60 | HR 72 | Ht 68.0 in | Wt 214.4 lb

## 2021-08-02 DIAGNOSIS — E1169 Type 2 diabetes mellitus with other specified complication: Secondary | ICD-10-CM

## 2021-08-02 DIAGNOSIS — G4730 Sleep apnea, unspecified: Secondary | ICD-10-CM

## 2021-08-02 DIAGNOSIS — I10 Essential (primary) hypertension: Secondary | ICD-10-CM | POA: Diagnosis not present

## 2021-08-02 DIAGNOSIS — G4733 Obstructive sleep apnea (adult) (pediatric): Secondary | ICD-10-CM

## 2021-08-02 DIAGNOSIS — F4323 Adjustment disorder with mixed anxiety and depressed mood: Secondary | ICD-10-CM

## 2021-08-02 DIAGNOSIS — Z951 Presence of aortocoronary bypass graft: Secondary | ICD-10-CM | POA: Diagnosis not present

## 2021-08-02 DIAGNOSIS — E782 Mixed hyperlipidemia: Secondary | ICD-10-CM

## 2021-08-02 DIAGNOSIS — E119 Type 2 diabetes mellitus without complications: Secondary | ICD-10-CM

## 2021-08-02 MED ORDER — ALPRAZOLAM 0.5 MG PO TABS: 0.5000 mg | ORAL_TABLET | Freq: Every evening | ORAL | 0 refills | Status: DC | PRN

## 2021-08-02 NOTE — Patient Instructions (Addendum)
Medication Instructions:  Xanx 0.5 mg (1 Tablet at Bedtime as Needed.) *If you need a refill on your cardiac medications before your next appointment, please call your pharmacy*   Lab Work: No labs If you have labs (blood work) drawn today and your tests are completely normal, you will receive your results only by: MyChart Message (if you have MyChart) OR A paper copy in the mail If you have any lab test that is abnormal or we need to change your treatment, we will call you to review the results.   Testing/Procedures: [facility]. Your physician has recommended that you have a sleep study. This test records several body functions during sleep, including: brain activity, eye movement, oxygen and carbon dioxide blood levels, heart rate and rhythm, breathing rate and rhythm, the flow of air through your mouth and nose, snoring, body muscle movements, and chest and belly movement.    Follow-Up: At [facility], you and your health needs are our priority.  As part of our continuing mission to provide you with exceptional heart care, we have created designated Provider Care Teams.  These Care Teams include your primary Cardiologist (physician) and Advanced Practice Providers (APPs -  Physician Assistants and Nurse Practitioners) who all work together to provide you with the care you need, when you need it.  We recommend signing up for the patient portal called "MyChart".  Sign up information is provided on this After Visit Summary.  MyChart is used to connect with patients for Virtual Visits (Telemedicine).  Patients are able to view lab/test results, encounter notes, upcoming appointments, etc.  Non-urgent messages can be sent to your provider as well.   To learn more about what you can do with MyChart, go to [email or web address].    Your next appointment:   October 10, 2021 8 AM  The format for your next appointment:   In Person  Provider:   Nicki Guadalajara, MD

## 2021-08-03 ENCOUNTER — Other Ambulatory Visit: Payer: Self-pay | Admitting: Adult Health

## 2021-08-23 ENCOUNTER — Ambulatory Visit: Payer: BC Managed Care – PPO | Attending: Adult Health | Admitting: Cardiovascular Disease

## 2021-08-23 DIAGNOSIS — G4733 Obstructive sleep apnea (adult) (pediatric): Secondary | ICD-10-CM | POA: Insufficient documentation

## 2021-08-23 DIAGNOSIS — G4731 Primary central sleep apnea: Secondary | ICD-10-CM | POA: Diagnosis not present

## 2021-08-23 DIAGNOSIS — G4730 Sleep apnea, unspecified: Secondary | ICD-10-CM | POA: Diagnosis not present

## 2021-08-26 ENCOUNTER — Telehealth: Payer: Self-pay | Admitting: Cardiovascular Disease

## 2021-08-26 NOTE — Telephone Encounter (Signed)
Patient's wife calling to find out when the patient can get his sleep machine. She states his sleep study came back and he does need a machine. She says the machine will also need to be calibrated and would like to know if that is done in the office and if so he needs to have it done before the end of the year. She says his insurance is changing and she is not sure what his new one will be or whether he will still be in network.

## 2021-08-27 NOTE — Telephone Encounter (Signed)
Returned a call to the patient's wife. Informed her that her husband just had his sleep study 4 days ago. It has not been processed and read. I can promise her that he will not have his machine by the end of the year. She was informed of the nation wide back order on the machines. I explained to her the process. His study first has to be read. Then based on the report he may need to go by to have a CPAP titration study. I won't know any of this until the study he just had has been red and given to me for report. She based her understanding, and thanked me for calling herback.

## 2021-09-16 ENCOUNTER — Encounter: Payer: Self-pay | Admitting: Family Medicine

## 2021-09-16 ENCOUNTER — Encounter: Payer: Self-pay | Admitting: Cardiovascular Disease

## 2021-09-16 DIAGNOSIS — G4733 Obstructive sleep apnea (adult) (pediatric): Secondary | ICD-10-CM | POA: Insufficient documentation

## 2021-09-16 HISTORY — DX: Obstructive sleep apnea (adult) (pediatric): G47.33

## 2021-09-16 NOTE — Progress Notes (Signed)
Reviewed report/notes and updated pt's chart/history/PL and/or HM accordingly. 

## 2021-09-16 NOTE — Procedures (Signed)
[facility]        Patient Name: Barry Schmidt, Barry Schmidt Date: 08/23/2021 Gender: Male D.O.B: [birth year] Age (years): 49 Referring Provider: Jory Sims NP Height (inches): 68 Interpreting Physician: [name] MD, ABSM Weight (lbs): 214 RPSGT: Peak, Robert BMI: 33 MRN: [id number] Neck Size: 16.50  CLINICAL INFORMATION Sleep Study Type: NPSG  Indication for sleep study: N/A  Epworth Sleepiness Score: 7  SLEEP STUDY TECHNIQUE As per the AASM Manual for the Scoring of Sleep and Associated Events v2.3 (April 2016) with a hypopnea requiring 4% desaturations.  The channels recorded and monitored were frontal, central and occipital EEG, electrooculogram (EOG), submentalis EMG (chin), nasal and oral airflow, thoracic and abdominal wall motion, anterior tibialis EMG, snore microphone, electrocardiogram, and pulse oximetry.  MEDICATIONS furosemide (LASIX) 20 MG tablet acetaminophen (TYLENOL) 325 MG tablet ALPRAZolam (XANAX) 0.5 MG tablet aspirin EC 81 MG tablet azithromycin (ZITHROMAX) 250 MG tablet Blood Glucose Monitoring Suppl (ONE TOUCH ULTRA 2) w/Device KIT dapagliflozin propanediol (FARXIGA) 10 MG TABS tablet glucose blood (ACCU-CHEK AVIVA PLUS) test strip Lancets (ONETOUCH ULTRASOFT) lancets metFORMIN (GLUCOPHAGE) 1000 MG tablet metoprolol tartrate (LOPRESSOR) 25 MG tablet nitroGLYCERIN (NITROSTAT) 0.4 MG SL tablet (Expired) rosuvastatin (CRESTOR) 40 MG tablet Medications self-administered by patient taken the night of the study : N/A  SLEEP ARCHITECTURE The study was initiated at 9:15:29 PM and ended at 5:28:00 AM.  Sleep onset time was 35.2 minutes and the sleep efficiency was 79.2%. The total sleep time was 390 minutes.  Stage REM latency was 144.5 minutes.  The patient spent 5.90% of the night in stage N1 sleep, 75.90% in stage N2 sleep, 1.41% in stage N3 and 16.8% in REM.  Alpha intrusion was  absent.  Supine sleep was 28.20%.  RESPIRATORY PARAMETERS The overall apnea/hypopnea index (AHI) was 17.7 per hour. The respiraory disturbance index (RDI) was 17.7/h. There were 65 total apneas, including 2 obstructive, 63 central and 0 mixed apneas. There were 50 hypopneas and 0 RERAs.  The AHI during Stage REM sleep was 1.8 per hour.  AHI while supine was 56.2 per hour.  The mean oxygen saturation was 93.81%. The minimum SpO2 during sleep was 84.00%.  Loud snoring was noted during this study.  CARDIAC DATA The 2 lead EKG demonstrated sinus rhythm. The mean heart rate was 66.12 beats per minute. Other EKG findings include: None.  LEG MOVEMENT DATA The total PLMS were 0 with a resulting PLMS index of 0.00. Associated arousal with leg movement index was 0.0 .  IMPRESSIONS - Moderate obstructive sleep apnea overall (AHI 17.7/h; RDI 17.7): however, sleep apnea was severe with supine sleep (AHI 56.2/h). - Mild central sleep apnea occurred during this study (CAI = 9.7/h). - Mild oxygen desaturation to a nadir of 84%. - The patient snored with loud snoring volume. - No cardiac abnormalities were noted during this study. - Clinically significant periodic limb movements did not occur during sleep. No significant associated arousals.  DIAGNOSIS - Obstructive Sleep Apnea (G47.33) - Central Sleep Apnea (G47.37)  RECOMMENDATIONS - In-lab CPAP titration to determine optimal pressure required to alleviate sleep disordered breathing. BiPAP or ASV titration may be required to eliminate central sleep apnea. - Effort should be made to optimize nasal and oropharyngeal patency. - Positional therapy avoiding supine  position during sleep. - Avoid alcohol, sedatives and other CNS depressants that may worsen sleep apnea and disrupt normal sleep architecture. - Sleep hygiene should be reviewed to assess factors that may improve sleep quality. - Weight management (BMI 33) and regular exercise should be  initiated or continued if appropriate.  [Electronically signed] 09/16/2021 06:58 AM  [name] MD, [facility] [place], American Board of Sleep Medicine   NPI: [id number]  [place] PH: [phone]   FX: [phone] [place]

## 2021-09-26 ENCOUNTER — Telehealth: Payer: Self-pay | Admitting: *Deleted

## 2021-09-26 ENCOUNTER — Other Ambulatory Visit: Payer: Self-pay | Admitting: Cardiovascular Disease

## 2021-09-26 DIAGNOSIS — G4733 Obstructive sleep apnea (adult) (pediatric): Secondary | ICD-10-CM

## 2021-09-26 NOTE — Telephone Encounter (Signed)
Patient was notified of his sleep results and recommendations. He voiced verbal understanding, however he states that his insurance will change over to [facility] on 10/06/21. He has office appointment on 10/10/21 and he will discuss what he wants to do then.

## 2021-09-27 ENCOUNTER — Other Ambulatory Visit: Payer: Self-pay | Admitting: Family Medicine

## 2021-10-01 ENCOUNTER — Ambulatory Visit (INDEPENDENT_AMBULATORY_CARE_PROVIDER_SITE_OTHER): Payer: BC Managed Care – PPO | Admitting: Family Medicine

## 2021-10-01 ENCOUNTER — Other Ambulatory Visit: Payer: Self-pay

## 2021-10-01 ENCOUNTER — Telehealth: Payer: Self-pay

## 2021-10-01 ENCOUNTER — Encounter: Payer: Self-pay | Admitting: Family Medicine

## 2021-10-01 VITALS — BP 122/62 | HR 63 | Temp 97.5°F | Ht 68.0 in | Wt 205.1 lb

## 2021-10-01 DIAGNOSIS — F4322 Adjustment disorder with anxiety: Secondary | ICD-10-CM | POA: Diagnosis not present

## 2021-10-01 DIAGNOSIS — H43813 Vitreous degeneration, bilateral: Secondary | ICD-10-CM | POA: Diagnosis not present

## 2021-10-01 DIAGNOSIS — I2510 Atherosclerotic heart disease of native coronary artery without angina pectoris: Secondary | ICD-10-CM | POA: Diagnosis not present

## 2021-10-01 DIAGNOSIS — H35033 Hypertensive retinopathy, bilateral: Secondary | ICD-10-CM | POA: Diagnosis not present

## 2021-10-01 DIAGNOSIS — E1140 Type 2 diabetes mellitus with diabetic neuropathy, unspecified: Secondary | ICD-10-CM | POA: Diagnosis not present

## 2021-10-01 DIAGNOSIS — H348322 Tributary (branch) retinal vein occlusion, left eye, stable: Secondary | ICD-10-CM | POA: Diagnosis not present

## 2021-10-01 DIAGNOSIS — E1169 Type 2 diabetes mellitus with other specified complication: Secondary | ICD-10-CM | POA: Diagnosis not present

## 2021-10-01 DIAGNOSIS — Z23 Encounter for immunization: Secondary | ICD-10-CM | POA: Diagnosis not present

## 2021-10-01 DIAGNOSIS — G4733 Obstructive sleep apnea (adult) (pediatric): Secondary | ICD-10-CM

## 2021-10-01 DIAGNOSIS — H35361 Drusen (degenerative) of macula, right eye: Secondary | ICD-10-CM | POA: Diagnosis not present

## 2021-10-01 DIAGNOSIS — E782 Mixed hyperlipidemia: Secondary | ICD-10-CM | POA: Diagnosis not present

## 2021-10-01 LAB — CBC WITH DIFFERENTIAL/PLATELET
Basophils Absolute: 0 10*3/uL (ref 0.0–0.1)
Basophils Relative: 0.5 % (ref 0.0–3.0)
Eosinophils Absolute: 0.1 10*3/uL (ref 0.0–0.7)
Eosinophils Relative: 2.4 % (ref 0.0–5.0)
HCT: 40.4 % (ref 39.0–52.0)
Hemoglobin: 13.6 g/dL (ref 13.0–17.0)
Lymphocytes Relative: 32.8 % (ref 12.0–46.0)
Lymphs Abs: 1.7 10*3/uL (ref 0.7–4.0)
MCHC: 33.6 g/dL (ref 30.0–36.0)
MCV: 80.5 fl (ref 78.0–100.0)
Monocytes Absolute: 0.5 10*3/uL (ref 0.1–1.0)
Monocytes Relative: 9.5 % (ref 3.0–12.0)
Neutro Abs: 2.8 10*3/uL (ref 1.4–7.7)
Neutrophils Relative %: 54.8 % (ref 43.0–77.0)
Platelets: 195 10*3/uL (ref 150.0–400.0)
RBC: 5.02 Mil/uL (ref 4.22–5.81)
RDW: 15.8 % — ABNORMAL HIGH (ref 11.5–15.5)
WBC: 5.2 10*3/uL (ref 4.0–10.5)

## 2021-10-01 LAB — COMPREHENSIVE METABOLIC PANEL
ALT: 17 U/L (ref 0–53)
AST: 18 U/L (ref 0–37)
Albumin: 4.4 g/dL (ref 3.5–5.2)
Alkaline Phosphatase: 63 U/L (ref 39–117)
BUN: 16 mg/dL (ref 6–23)
CO2: 27 mEq/L (ref 19–32)
Calcium: 9.1 mg/dL (ref 8.4–10.5)
Chloride: 102 mEq/L (ref 96–112)
Creatinine, Ser: 0.83 mg/dL (ref 0.40–1.50)
GFR: 96.24 mL/min (ref >60.00)
Glucose, Bld: 124 mg/dL — ABNORMAL HIGH (ref 70–99)
Potassium: 3.9 mEq/L (ref 3.5–5.1)
Sodium: 138 mEq/L (ref 135–145)
Total Bilirubin: 0.4 mg/dL (ref 0.2–1.2)
Total Protein: 7.1 g/dL (ref 6.0–8.3)

## 2021-10-01 LAB — LIPID PANEL
Cholesterol: 88 mg/dL (ref 0–200)
HDL: 49.3 mg/dL (ref >39.00)
LDL Cholesterol: 27 mg/dL (ref 0–99)
NonHDL: 38.72
Total CHOL/HDL Ratio: 2
Triglycerides: 60 mg/dL (ref 0.0–149.0)
VLDL: 12 mg/dL (ref 0.0–40.0)

## 2021-10-01 LAB — POCT GLYCOSYLATED HEMOGLOBIN (HGB A1C): Hemoglobin A1C: 7.1 % — AB (ref 4.0–5.6)

## 2021-10-01 MED ORDER — METFORMIN HCL 1000 MG PO TABS: 1000.0000 mg | ORAL_TABLET | Freq: Two times a day (BID) | ORAL | 3 refills | Status: DC

## 2021-10-01 MED ORDER — SERTRALINE HCL 25 MG PO TABS: ORAL_TABLET | ORAL | 2 refills | Status: DC

## 2021-10-01 MED ORDER — ALPRAZOLAM 0.5 MG PO TABS: 0.5000 mg | ORAL_TABLET | Freq: Every day | ORAL | 0 refills | Status: DC | PRN

## 2021-10-01 MED ORDER — SERTRALINE HCL 25 MG PO TABS: ORAL_TABLET | ORAL | 0 refills | Status: DC

## 2021-10-01 NOTE — Telephone Encounter (Signed)
Spoke with patient.

## 2021-10-01 NOTE — Telephone Encounter (Signed)
Spouse has called in stating that a script for zoloft was prescribed.  States [facility] wants 90 day script with his insurance.   Please follow up in regard.

## 2021-10-01 NOTE — Progress Notes (Signed)
Subjective  CC:  Chief Complaint  Patient presents with   Diabetes    HPI: Barry Schmidt is a 60 y.o. male who presents to the office today for follow up of diabetes and problems listed above in the chief complaint.  Diabetes follow up: His diabetic control is reported as  fair. Fastings avg 120s-130s, improved from right after surgery. Sherald Hess farxiga and metformin. Diet has been good.  He denies exertional CP or SOB or symptomatic hypoglycemia. He denies foot sores or paresthesias.  CAD: to see cards next week for f/u. Overall, no angina or palpitations since on BB, but will have chest wall soreness/pain at times that continues to make him anxious Sleep apnea: Reviewed recent pulmonology notes.  Had sleep test, moderate apnea.  Can get in for titration for CPAP until February.  He is mildly frustrated by this.  Sleep is poor. Anxiety: Discussed at length.  Has had chronic stress at work.  Increased stress from recent heart disease and surgery.  Continues to worry about his overall health.  Wife agrees that he struggles most days of the week.  Rarely uses Xanax but it is helpful when he has tried it.  Has never had a mood disorder.  Never been on medications.  Not the perfect candidate for therapy at this time.  No panic attacks. Hyperlipidemia on Crestor nightly.  Tolerating well.  LDL goal less than 70.  He is fasting today for recheck  Wt Readings from Last 3 Encounters:  10/01/21 205 lb 1.6 oz (93 kg)  08/02/21 214 lb 6.4 oz (97.3 kg)  07/26/21 211 lb 6.4 oz (95.9 kg)    BP Readings from Last 3 Encounters:  10/01/21 122/62  08/02/21 140/60  07/12/21 124/78    Assessment  1. Controlled type 2 diabetes mellitus with neuropathy ([place])   2. Coronary artery disease involving native coronary artery of native heart without angina pectoris   3. Combined hyperlipidemia associated with type 2 diabetes mellitus ([place])   4. Adjustment reaction with anxiety   5. OSA (obstructive sleep  apnea)   6. Immunization due      Plan  Diabetes is currently marginally controlled.  However it is improving.  Mild elevation in part due to stress of surgery.  He will continue diet, Farxiga 10 and metformin 1000 twice daily.  We will recheck in 3 months.  Can adjust medications at that time if needed.  Goal A1c less than 7.  Update Prevnar 20 today. Coronary disease: Stable.  Has follow-up next week.  Reassured about chest wall pain.  Labs pending HLD on rosuvastatin.  Recheck fasting lipids today. Had long discussion about adjustment reaction with anxiety.  Ongoing for over 6 months.  No depressive symptoms.  Rare Xanax use.  Recommend Zoloft.  Educated on risks and benefits.  Appropriate use and expectations.  Recommend follow-up in 6 to 12 months depending upon how he is doing.  May use Xanax intermittently if needed while he is starting medication. Sleep apnea: Does need CPAP.  We will follow-up with pulmonology.  Reassured. Shingrix No. 1 given today as well.  Follow up: 3 months to follow-up diabetes and anxiety.. Orders Placed This Encounter  Procedures   Varicella-zoster vaccine IM (Shingrix)   Pneumococcal conjugate vaccine 20-valent (Prevnar 20)   CBC with Differential/Platelet   Comprehensive metabolic panel   Lipid panel   POCT HgB A1C   Meds ordered this encounter  Medications   metFORMIN (GLUCOPHAGE) 1000 MG tablet  Sig: Take 1 tablet (1,000 mg total) by mouth 2 (two) times daily with a meal.    Dispense:  180 tablet    Refill:  3   sertraline (ZOLOFT) 25 MG tablet    Sig: Take 1 tablet (25 mg total) by mouth daily for 14 days, THEN 2 tablets (50 mg total) daily for 14 days.    Dispense:  60 tablet    Refill:  2   ALPRAZolam (XANAX) 0.5 MG tablet    Sig: Take 1 tablet (0.5 mg total) by mouth daily as needed for anxiety.    Dispense:  20 tablet    Refill:  0      Immunization History  Administered Date(s) Administered   Influenza,inj,Quad PF,6+ Mos  06/24/2018, 06/03/2019, 06/25/2021   Janssen (J&J) SARS-COV-2 Vaccination 02/29/2020   PNEUMOCOCCAL CONJUGATE-20 10/01/2021   Pneumococcal Polysaccharide-23 10/06/2010   Tdap 10/06/2012   Zoster Recombinat (Shingrix) 10/01/2021    Diabetes Related Lab Review: Lab Results  Component Value Date   HGBA1C 7.1 (A) 10/01/2021   HGBA1C 5.9 (A) 06/25/2021   HGBA1C 7.4 (H) 04/25/2021    Lab Results  Component Value Date   MICROALBUR <0.7 06/25/2021   Lab Results  Component Value Date   CREATININE 0.90 07/12/2021   BUN 14 07/12/2021   NA 139 07/12/2021   K 4.7 07/12/2021   CL 101 07/12/2021   CO2 22 07/12/2021   Lab Results  Component Value Date   CHOL 110 04/02/2021   CHOL 137 12/09/2019   CHOL 206 (H) 06/03/2019   Lab Results  Component Value Date   HDL 51.10 04/02/2021   HDL 61.10 12/09/2019   HDL 54.90 06/03/2019   Lab Results  Component Value Date   LDLCALC 42 04/02/2021   LDLCALC 62 12/09/2019   LDLCALC 123 (H) 06/03/2019   Lab Results  Component Value Date   TRIG 87.0 04/02/2021   TRIG 71.0 12/09/2019   TRIG 137.0 06/03/2019   Lab Results  Component Value Date   CHOLHDL 2 04/02/2021   CHOLHDL 2 12/09/2019   CHOLHDL 4 06/03/2019   Lab Results  Component Value Date   LDLDIRECT 90.0 06/23/2018   The ASCVD Risk score (Arnett DK, et al., 2019) failed to calculate for the following reasons:   The valid total cholesterol range is 130 to 320 mg/dL I have reviewed the PMH, Fam and Soc history. Patient Active Problem List   Diagnosis Date Noted   OSA (obstructive sleep apnea) 09/16/2021    Priority: High    Central and obstructive: sleep study 09/2021; cpap started. May warrant bipap. Avoid supine position    S/P CABG x 3, 04/2021     Priority: High   CAD (coronary artery disease), native coronary artery 04/24/2021    Priority: High   Former smoker 04/02/2021    Priority: High    Quit 11/2019    Controlled type 2 diabetes mellitus with neuropathy ([place])  10/14/2018    Priority: High    Diagnosed in 2007; was morbidly obese at that time 260 pounds. He lost weight and did well.  Negative urine MAC 07/2018; not on ACE. Normotensive    Combined hyperlipidemia associated with type 2 diabetes mellitus ([place]) 10/14/2018    Priority: High   Adjustment reaction with anxiety 10/01/2021    Priority: Medium    Adhesive capsulitis of left shoulder 01/19/2020    Priority: Medium    Femoral neuropathy of right lower extremity 10/14/2018    Priority: Medium  Social History: Patient  reports that he quit smoking about 22 months ago. His smoking use included cigarettes. He has never used smokeless tobacco. He reports current alcohol use. He reports that he does not use drugs.  Review of Systems: Ophthalmic: negative for eye pain, loss of vision or double vision Cardiovascular: negative for chest pain Respiratory: negative for SOB or persistent cough Gastrointestinal: negative for abdominal pain Genitourinary: negative for dysuria or gross hematuria MSK: negative for foot lesions Neurologic: negative for weakness or gait disturbance  Objective  Vitals: BP 122/62    Pulse 63    Temp (!) 97.5 F (36.4 C) (Temporal)    Ht _0  (1.727 m)    Wt 205 lb 1.6 oz (93 kg)    SpO2 99%    BMI 31.19 kg/m  General: well appearing, no acute distress  Psych:  Alert and oriented, normal mood and affect HEENT:  Normocephalic, atraumatic, moist mucous membranes, supple neck  Cardiovascular:  Nl S1 and S2, RRR without murmur, gallop or rub. no edema     Diabetic education: ongoing education regarding chronic disease management for diabetes was given today. We continue to reinforce the ABC's of diabetic management: A1c (<7 or 8 dependent upon patient), tight blood pressure control, and cholesterol management with goal LDL < 100 minimally. We discuss diet strategies, exercise recommendations, medication options and possible side effects. At each visit, we review  recommended immunizations and preventive care recommendations for diabetics and stress that good diabetic control can prevent other problems. See below for this patient's data.   Commons side effects, risks, benefits, and alternatives for medications and treatment plan prescribed today were discussed, and the patient expressed understanding of the given instructions. Patient is instructed to call or message via MyChart if he/she has any questions or concerns regarding our treatment plan. No barriers to understanding were identified. We discussed Red Flag symptoms and signs in detail. Patient expressed understanding regarding what to do in case of urgent or emergency type symptoms.  Medication list was reconciled, printed and provided to the patient in AVS. Patient instructions and summary information was reviewed with the patient as documented in the AVS. This note was prepared with assistance of Dragon voice recognition software. Occasional wrong-word or sound-a-like substitutions may have occurred due to the inherent limitations of voice recognition software  This visit occurred during the SARS-CoV-2 public health emergency.  Safety protocols were in place, including screening questions prior to the visit, additional usage of staff PPE, and extensive cleaning of exam room while observing appropriate contact time as indicated for disinfecting solutions.

## 2021-10-01 NOTE — Patient Instructions (Signed)
Please return in 3 months to recheck diabetes and anxiety. Sooner if needed to assess new medication for mood.  I will release your lab results to you on your MyChart account with further instructions. Please reply with any questions.    Today you were give your 1st shingrix vaccine and your Prevnar 20. We will give you your 2nd shingrix in 6 months.   Happy New Year!  If you have any questions or concerns, please don't hesitate to send me a message via MyChart or call the office at [phone]. Thank you for visiting with [name] today! It's our pleasure caring for you.

## 2021-10-02 ENCOUNTER — Other Ambulatory Visit: Payer: Self-pay | Admitting: Family Medicine

## 2021-10-02 NOTE — Progress Notes (Signed)
Please call patient: I have reviewed his/her lab results. Results are stable. Work on improving diabetes as we discussed. Cholesterol levels are very low and I will forward to his cardiologist for review. We will recheck diabetes in 3 months.

## 2021-10-10 ENCOUNTER — Other Ambulatory Visit: Payer: Self-pay

## 2021-10-10 ENCOUNTER — Encounter: Payer: Self-pay | Admitting: Cardiovascular Disease

## 2021-10-10 ENCOUNTER — Ambulatory Visit (INDEPENDENT_AMBULATORY_CARE_PROVIDER_SITE_OTHER): Payer: 59 | Admitting: Cardiovascular Disease

## 2021-10-10 DIAGNOSIS — H0263 Xanthelasma of right eye, unspecified eyelid: Secondary | ICD-10-CM

## 2021-10-10 DIAGNOSIS — G4733 Obstructive sleep apnea (adult) (pediatric): Secondary | ICD-10-CM

## 2021-10-10 DIAGNOSIS — I2510 Atherosclerotic heart disease of native coronary artery without angina pectoris: Secondary | ICD-10-CM | POA: Diagnosis not present

## 2021-10-10 DIAGNOSIS — Z951 Presence of aortocoronary bypass graft: Secondary | ICD-10-CM | POA: Diagnosis not present

## 2021-10-10 DIAGNOSIS — E119 Type 2 diabetes mellitus without complications: Secondary | ICD-10-CM

## 2021-10-10 DIAGNOSIS — I10 Essential (primary) hypertension: Secondary | ICD-10-CM

## 2021-10-10 DIAGNOSIS — F419 Anxiety disorder, unspecified: Secondary | ICD-10-CM

## 2021-10-10 DIAGNOSIS — E785 Hyperlipidemia, unspecified: Secondary | ICD-10-CM

## 2021-10-10 MED ORDER — METOPROLOL TARTRATE 25 MG PO TABS: 25.0000 mg | ORAL_TABLET | Freq: Two times a day (BID) | ORAL | 6 refills | Status: DC

## 2021-10-10 NOTE — Patient Instructions (Signed)
Medication Instructions:  The current medical regimen is effective;  continue present plan and medications as directed. Please refer to the Current Medication list given to you today.   *If you need a refill on your cardiac medications before your next appointment, please call your pharmacy*  Lab Work:   Testing/Procedures:  NONE    NONE  Follow-Up: Your next appointment:  3-4 month(s) In Person with Nicki Guadalajara, MD    At [facility], you and your health needs are our priority.  As part of our continuing mission to provide you with exceptional heart care, we have created designated Provider Care Teams.  These Care Teams include your primary Cardiologist (physician) and Advanced Practice Providers (APPs -  Physician Assistants and Nurse Practitioners) who all work together to provide you with the care you need, when you need it.  We recommend signing up for the patient portal called "MyChart".  Sign up information is provided on this After Visit Summary.  MyChart is used to connect with patients for Virtual Visits (Telemedicine).  Patients are able to view lab/test results, encounter notes, upcoming appointments, etc.  Non-urgent messages can be sent to your provider as well.   To learn more about what you can do with MyChart, go to [email or web address].

## 2021-10-10 NOTE — Progress Notes (Signed)
Cardiology Office Note    Date:  10/15/2021   ID:  Barry Schmidt, DOB [birth year], MRN [id number]  PCP:  [name], MD  Cardiologist:  [name], MD   Follow-up cardiology evaluation initially referred through the courtesy of Dr. [name] for evaluation of chest tightness   History of Present Illness:  Barry Schmidt is a 61 y.o. male who is originally from [facility].  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  He had recently developed chest tightness particularly while lifting objects and is referred by Dr. [name] for cardiology consultation.  I saw him for my initial evaluation on April 04, 2021.  Barry Schmidt has remote tobacco history and had smoked off and on for 12 years but quit in February 2019.  He has a history of diabetes mellitus for at least 10 years.  He states that he had had episodes of vertigo and staggering shortly following being vaccinated for COVID.  He underwent MRI of his head.  He states that 1 year ago he started to notice more shortness of breath when wearing a mask.  Recently, he has experienced progressive shortness of breath with activity and chest tightness when he is walking and carrying things.  He recently attended [employer] and developed significant chest tightness while walking on his infield carrying a cooler.  He describes a tightness as a pressure at the weight sitting on his chest.  His symptoms dissipate with rest.  He was evaluated by Dr. [name] on April 02, 2021 and due to concern for angina pectoris cardiology consultation was requested.   Past Medical History:  Diagnosis Date   Diabetes mellitus without complication ([place])    On statin therapy due to risk of future cardiovascular event 10/14/2018   OSA (obstructive sleep apnea) 09/16/2021   Central and obstructive: sleep study 09/2021; cpap started. May warrant bipap. Avoid supine position   S/P CABG x 3, 04/2021    Uncontrolled  diabetes mellitus without complication, with long-term current use of insulin 06/23/2018   Diagnosed in 2007; was morbidly obese at that time 260 pounds. He lost weight and did well.     He has recently started to see a retina specialist for his left eye.  He has a history of hyperlipidemia and was recently started on rosuvastatin.  He has been on Farxiga and metformin for his diabetes mellitus.  He also has a history for neuropathy.  During his initial evaluation with me, I was concerned with his symptoms most likely representing progressive angina pectoris.  We discussed multiple scenarios of evaluation and after much discussion definitive cardiac catheterization was recommended.  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  There was 50% narrowing in the mid circumflex vessel after marginal branch.  He had significant dampening with each injection into the RCA and had 70% proximal stenosis with distal irregularity.  There was extensive collateralization from the distal RCA via septal collaterals supplying the LAD.  Due to his high-grade complex anatomy CABG revascularization was recommended.  He underwent successful CABG revascularization on April 25, 2020 by Dr. Roxan Hockey with LIMA to the LAD, radial artery to OM 2, and SVG to PDA.  He was discharged on May 01, 2021.  Subsequently, he has been evaluated by Coletta Memos, NP  and Jory Sims, NP in our office.  He had issues with lower extremity edema.  He has had issues with anxiety and stress and panic attacks.   Due to concerns for sleep apnea he was referred for a sleep study which was done in [facility] from November until 2022.  He was found to have moderate overall sleep apnea with AHI of 17.7; no events were severe with supine  sleep AHI 56.2/h.  There was loud snoring.  Following his diagnostic study he also was noted to have central apneic events with a central apnea index of 9.7.  As result, in lab CPAP titration was recommended with potential need for BiPAP or ASV titration if necessary depending upon LV function.  Presently, he denies any chest pain.  He denies any palpitations.  He is back and working Designer, multimedia.  He has had issues with his left shoulder when he lifts his left arm.  He does notice occasional left lower extremity edema.  He is now on furosemide 20 mg on an as-needed basis, Toprol tartrate 25 mg twice a day.  He is on [place] and metformin for his diabetes.  He is on sertraline and alprazolam for anxiety.  He presents for evaluation.  Past Surgical History:  Procedure Laterality Date   CORONARY ARTERY BYPASS GRAFT N/A 04/25/2021   Procedure: CORONARY ARTERY BYPASS GRAFTING (CABG)x 3 ON CARDIOPULMONARY BYPASS USING LIMA, LEFT RADIAL ARTERY AND  GSV.;  Surgeon: Melrose Nakayama, MD;  Location: [facility];  Service: Open Heart Surgery;  Laterality: N/A;   ENDOVEIN HARVEST OF GREATER SAPHENOUS VEIN Bilateral 04/25/2021   Procedure: ENDOVEIN HARVEST OF GREATER SAPHENOUS VEIN;  Surgeon: Melrose Nakayama, MD;  Location: [facility];  Service: Open Heart Surgery;  Laterality: Bilateral;   LEFT HEART CATH AND CORONARY ANGIOGRAPHY N/A 04/24/2021   Procedure: LEFT HEART CATH AND CORONARY ANGIOGRAPHY;  Surgeon: Troy Sine, MD;  Location: [facility];  Service: Cardiovascular;  Laterality: N/A;   RADIAL ARTERY HARVEST Left 04/25/2021   Procedure: RADIAL ARTERY HARVEST;  Surgeon: Melrose Nakayama, MD;  Location: [facility];  Service: Open Heart Surgery;  Laterality: Left;   TEE WITHOUT CARDIOVERSION N/A 04/25/2021   Procedure: TRANSESOPHAGEAL ECHOCARDIOGRAM (TEE);  Surgeon: Melrose Nakayama, MD;  Location: [facility];  Service: Open Heart Surgery;  Laterality: N/A;    Current  Medications: Outpatient Medications Prior to Visit  Medication Sig Dispense Refill   acetaminophen (TYLENOL) 325 MG tablet Take 2 tablets (650 mg total) by mouth every 6 (six) hours as needed for mild pain.     ALPRAZolam (XANAX) 0.5 MG tablet Take 1 tablet (0.5 mg total) by mouth daily as needed for anxiety. 20 tablet 0   aspirin EC 81 MG tablet Take 81 mg by mouth at bedtime.     Blood Glucose Monitoring Suppl (ONE TOUCH ULTRA 2) w/Device KIT 1 kit by Does not apply route 2 (two) times daily. 1 each 0   FARXIGA 10 MG TABS tablet TAKE 1 TABLET BY MOUTH EVERY DAY 90 tablet 1   furosemide (LASIX) 20 MG tablet TAKE 1 TABLET BY MOUTH AS NEEDED 30 tablet 3   glucose blood (ACCU-CHEK AVIVA PLUS) test strip USE AS INSTRUCTED TO TEST BLOOD GLUCOSE 100 each 12   Lancets (ONETOUCH ULTRASOFT) lancets Use as instructed 100 each 12   metFORMIN (GLUCOPHAGE) 1000 MG tablet Take 1 tablet (1,000 mg total) by mouth 2 (two) times daily with a meal. 180  tablet 3   rosuvastatin (CRESTOR) 40 MG tablet Take 1 tablet (40 mg total) by mouth at bedtime. 90 tablet 3   sertraline (ZOLOFT) 50 MG tablet Take 1 tablet (50 mg total) by mouth daily. 90 tablet 1   metoprolol tartrate (LOPRESSOR) 25 MG tablet Take 1 tablet (25 mg total) by mouth 2 (two) times daily. 60 tablet 6   nitroGLYCERIN (NITROSTAT) 0.4 MG SL tablet Place 1 tablet (0.4 mg total) under the tongue every 5 (five) minutes as needed for chest pain. 25 tablet 6   No facility-administered medications prior to visit.     Allergies:   Penicillins   Social History   Socioeconomic History   Marital status: Married    Spouse name: [name]   Number of children: 2   Years of education: Not on file   Highest education level: Not on file  Occupational History   Occupation: Freight forwarder, Academic librarian: [employer]  Tobacco Use   Smoking status: Former    Types: Cigarettes    Quit date: 11/07/2019    Years since quitting: 1.9   Smokeless tobacco: Never  Vaping Use    Vaping Use: Former  Substance and Sexual Activity   Alcohol use: Yes    Comment: Occastional    Drug use: Never   Sexual activity: Yes    Partners: Female  Other Topics Concern   Not on file  Social History Narrative   Right handed    Caffeine 4 daily    Lives at home with wife ( [name]) and child.   Social Determinants of Health   Financial Resource Strain: Not on file  Food Insecurity: Not on file  Transportation Needs: Not on file  Physical Activity: Not on file  Stress: Not on file  Social Connections: Not on file    Socially he was born in [place].  He is in his second marriage of 13 years..  His first marriage lasted 28 years.  He has 2 children ages 8 and 35 and 2 grandchildren ages 65 and 66.  He started college but never finished.  He does not routinely exercise but is states while at work he often walks up to 18,000 steps per day.   Family History:  The patient's family history includes Diabetes in his brother, brother, and sister; Diabetes type I in his father; Healthy in his son and son; Heart attack in his father; Heart disease in his mother; Hypertension in his mother.   His mother is alive at age 61.  Father died at 51 and had diabetes and heart problems and was status post pacemaker insertion.  1 brother died at 64 and had suffered a myocardial infarction in his 35s.  He has 3 living brothers in their 43s and 2 sisters ages 1 and 58 both with diabetes.  ROS General: Negative; No fevers, chills, or night sweats;  HEENT: Recent left eye problem seeing a retina specialist.  No changes in hearing, sinus congestion, difficulty swallowing Pulmonary: Negative; No cough, wheezing, shortness of breath, hemoptysis Cardiovascular: See HPI GI: Negative; No nausea, vomiting, diarrhea, or abdominal pain GU: Negative; No dysuria, hematuria, or difficulty voiding Musculoskeletal: Left shoulder discomfort Hematologic/Oncology: Negative; no easy bruising,  bleeding Endocrine: Negative; no heat/cold intolerance; no diabetes Neuro: Negative; no changes in balance, headaches Skin: Negative; No rashes or skin lesions Psychiatric: Negative; No behavioral problems, depression Sleep: Positive for snoring, recently diagnosed moderate sleep apnea overall, severe during supine sleep.  Negative; no bruxism,  restless legs, hypnogognic hallucinations, no cataplexy Other comprehensive 14 point system review is negative.   PHYSICAL EXAM:   VS:  BP 105/67    Pulse (!) 53    Ht _0  (1.727 m)    Wt 205 lb (93 kg)    SpO2 98%    BMI 31.17 kg/m     Repeat blood pressure by me was 122/69.  Wt Readings from Last 3 Encounters:  10/10/21 205 lb (93 kg)  10/01/21 205 lb 1.6 oz (93 kg)  08/02/21 214 lb 6.4 oz (97.3 kg)    General: Alert, oriented, no distress.  Skin: normal turgor, no rashes, warm and dry HEENT: Normocephalic, atraumatic. Pupils equal round and reactive to light; sclera anicteric; extraocular muscles intact; xanthelasma over the right eye. Nose without nasal septal hypertrophy Mouth/Parynx benign; Mallinpatti scale 3 Neck: No JVD, no carotid bruits; normal carotid upstroke Lungs: clear to ausculatation and percussion; no wheezing or rales Chest wall: without tenderness to palpitation Heart: PMI not displaced, RRR, s1 s2 normal, 1/6 systolic murmur, no diastolic murmur, no rubs, gallops, thrills, or heaves Abdomen: soft, nontender; no hepatosplenomehaly, BS+; abdominal aorta nontender and not dilated by palpation. Back: no CVA tenderness Pulses 2+ Musculoskeletal: full range of motion, normal strength, no joint deformities Extremities: Trace ankle edema left lower extremity; no clubbing cyanosis or edema, Homan's sign negative  Neurologic: grossly nonfocal; Cranial nerves grossly wnl Psychologic: Normal mood and affect   Studies/Labs Reviewed:    October 10, 2021 ECG (independently read by me):Sinus bradycardia at 53; IRBBB, Nonspecific  T changes   April 04, 2021 ECG (independently read by me): NSR at 62, IRBBB, PRWP   Recent Labs: BMP Latest Ref Rng & Units 10/01/2021 07/12/2021 06/18/2021  Glucose 70 - 99 mg/dL 124(H) 119(H) 121(H)  BUN 6 - 23 mg/dL _1 Creatinine 0.40 - 1.50 mg/dL 0.83 0.90 0.79  BUN/Creat Ratio 9 - 20 - 16 23(H)  Sodium 135 - 145 mEq/L 138 139 140  Potassium 3.5 - 5.1 mEq/L 3.9 4.7 4.5  Chloride 96 - 112 mEq/L 102 101 101  CO2 19 - 32 mEq/L _2 Calcium 8.4 - 10.5 mg/dL 9.1 9.5 9.2     Hepatic Function Latest Ref Rng & Units 10/01/2021 04/25/2021 04/22/2021  Total Protein 6.0 - 8.3 g/dL 7.1 5.2(L) 6.7  Albumin 3.5 - 5.2 g/dL 4.4 3.3(L) 4.5  AST 0 - 37 U/L _3 ALT 0 - 53 U/L _4 Alk Phosphatase 39 - 117 U/L 63 41 73  Total Bilirubin 0.2 - 1.2 mg/dL 0.4 0.8 <0.2    CBC Latest Ref Rng & Units 10/01/2021 04/27/2021 04/26/2021  WBC 4.0 - 10.5 K/uL 5.2 7.7 12.6(H)  Hemoglobin 13.0 - 17.0 g/dL 13.6 9.5(L) 11.1(L)  Hematocrit 39.0 - 52.0 % 40.4 29.7(L) 33.5(L)  Platelets 150.0 - 400.0 K/uL 195.0 132(L) 158   Lab Results  Component Value Date   MCV 80.5 10/01/2021   MCV 89.5 04/27/2021   MCV 88.2 04/26/2021   Lab Results  Component Value Date   TSH 3.690 04/22/2021   Lab Results  Component Value Date   HGBA1C 7.1 (A) 10/01/2021     BNP No results found for: BNP  ProBNP No results found for: PROBNP   Lipid Panel     Component Value Date/Time   CHOL 88 10/01/2021 0908   TRIG 60.0 10/01/2021 0908   HDL 49.30 10/01/2021 0908   CHOLHDL 2 10/01/2021 0908  VLDL 12.0 10/01/2021 0908   LDLCALC 27 10/01/2021 0908   LDLDIRECT 90.0 06/23/2018 1113     RADIOLOGY: No results found.   Additional studies/ records that were reviewed today include:  I reviewed the recent evaluation by Dr. [name].  I also reviewed his [facility] room evaluation in 2019 when he presented with right leg pain and was felt to have sciatica.    Prox RCA lesion is 70%  stenosed.   RPAV lesion is 50% stenosed.   Dist RCA lesion is 50% stenosed.   Ost LAD to Prox LAD lesion is 100% stenosed.   Mid LM to Dist LM lesion is 85% stenosed.   Ramus lesion is 80% stenosed.   Ost Cx lesion is 70% stenosed.   Mid Cx to Dist Cx lesion is 50% stenosed.   There is moderate left ventricular systolic dysfunction.   LV end diastolic pressure is normal.   Severe multivessel CAD with 85% on distal left main stenosis; total ostial occlusion of a large LAD; 80% ostial ramus intermediate stenosis and 70% ostial circumflex stenosis.  There is 50% narrowing in the mid left circumflex after marginal branch.  There is significant dampening with each injection into the RCA.  There is 70% proximal RCA stenoses with 50% irregularity distally.  There is extensive collateralization from the distal RCA via septal collaterals supplying the LAD.   RECOMMENDATION: Patient will be admitted with plans for surgical consultation for CABG revascularization surgery due to high-grade left main stenosis.   Intervention   CLINICAL INFORMATION Sleep Study Type: NPSG   Indication for sleep study: N/A   Epworth Sleepiness Score: 7   SLEEP STUDY TECHNIQUE As per the AASM Manual for the Scoring of Sleep and Associated Events v2.3 (April 2016) with a hypopnea requiring 4% desaturations.   The channels recorded and monitored were frontal, central and occipital EEG, electrooculogram (EOG), submentalis EMG (chin), nasal and oral airflow, thoracic and abdominal wall motion, anterior tibialis EMG, snore microphone, electrocardiogram, and pulse oximetry.   MEDICATIONS furosemide (LASIX) 20 MG tablet acetaminophen (TYLENOL) 325 MG tablet ALPRAZolam (XANAX) 0.5 MG tablet aspirin EC 81 MG tablet azithromycin (ZITHROMAX) 250 MG tablet Blood Glucose Monitoring Suppl (ONE TOUCH ULTRA 2) w/Device KIT dapagliflozin propanediol (FARXIGA) 10 MG TABS tablet glucose blood (ACCU-CHEK AVIVA PLUS) test  strip Lancets (ONETOUCH ULTRASOFT) lancets metFORMIN (GLUCOPHAGE) 1000 MG tablet metoprolol tartrate (LOPRESSOR) 25 MG tablet nitroGLYCERIN (NITROSTAT) 0.4 MG SL tablet (Expired) rosuvastatin (CRESTOR) 40 MG tablet Medications self-administered by patient taken the night of the study : N/A   SLEEP ARCHITECTURE The study was initiated at 9:15:29 PM and ended at 5:28:00 AM.   Sleep onset time was 35.2 minutes and the sleep efficiency was 79.2%. The total sleep time was 390 minutes.   Stage REM latency was 144.5 minutes.   The patient spent 5.90% of the night in stage N1 sleep, 75.90% in stage N2 sleep, 1.41% in stage N3 and 16.8% in REM.   Alpha intrusion was absent.   Supine sleep was 28.20%.   RESPIRATORY PARAMETERS The overall apnea/hypopnea index (AHI) was 17.7 per hour. The respiraory disturbance index (RDI) was 17.7/h. There were 65 total apneas, including 2 obstructive, 63 central and 0 mixed apneas. There were 50 hypopneas and 0 RERAs.   The AHI during Stage REM sleep was 1.8 per hour.   AHI while supine was 56.2 per hour.   The mean oxygen saturation was 93.81%. The minimum SpO2 during sleep was 84.00%.  Loud snoring was noted during this study.   CARDIAC DATA The 2 lead EKG demonstrated sinus rhythm. The mean heart rate was 66.12 beats per minute. Other EKG findings include: None.   LEG MOVEMENT DATA The total PLMS were 0 with a resulting PLMS index of 0.00. Associated arousal with leg movement index was 0.0 .   IMPRESSIONS - Moderate obstructive sleep apnea overall (AHI 17.7/h; RDI 17.7): however, sleep apnea was severe with supine sleep (AHI 56.2/h). - Mild central sleep apnea occurred during this study (CAI = 9.7/h). - Mild oxygen desaturation to a nadir of 84%. - The patient snored with loud snoring volume. - No cardiac abnormalities were noted during this study. - Clinically significant periodic limb movements did not occur during sleep. No significant  associated arousals.   DIAGNOSIS - Obstructive Sleep Apnea (G47.33) - Central Sleep Apnea (G47.37)   RECOMMENDATIONS - In-lab CPAP titration to determine optimal pressure required to alleviate sleep disordered breathing. BiPAP or ASV titration may be required to eliminate central sleep apnea. - Effort should be made to optimize nasal and oropharyngeal patency. - Positional therapy avoiding supine position during sleep. - Avoid alcohol, sedatives and other CNS depressants that may worsen sleep apnea and disrupt normal sleep architecture. - Sleep hygiene should be reviewed to assess factors that may improve sleep quality. - Weight management (BMI 33) and regular exercise should be initiated or continued if appropriate.    ASSESSMENT:    1. CAD in native artery   2. S/P CABG x 3, 04/2021   3. OSA (obstructive sleep apnea)   4. Essential hypertension   5. Type 2 diabetes mellitus without complication, without long-term current use of insulin ([place])   6. Hyperlipidemia with target LDL less than 55   7. Xanthelasma of right eyelid   8. Anxiety     PLAN:  Barry Schmidt is a 61 year old gentleman who has a prior tobacco history and fortunately quit on his birthday November 25, 2017.   He has a history of diabetes mellitus and on exam he has evidence for xanthelasma above his right eye.  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  When I initially saw him he had developed exertional chest tightness particularly when carrying objects.  His symptoms became fairly intense when he was at [facility] and was carrying a cooler across the infield where he had to stop on several times to catch his breath.  He denies any resting chest wall discomfort.  Cardiac risk factors are notable for prior tobacco use, diabetes mellitus, hyperlipidemia, and family history for CAD.  Definitive cardiac catheterization revealed severe multivessel CAD with high-grade  left main as well as ostial disease.  Following day he underwent successful CABG revascularization by Dr. Roxan Hockey and LIMA placed to his LAD, radial artery to OM 2, and SVG to PDA.  Subsequently has had some issues with lower extremity edema which has improved with diuretic therapy.  He has had issues with his left shoulder but denies any exertional chest pain or significant dyspnea.  He is now on a regimen of Farxiga and metformin for his diabetes mellitus.  He is on metoprolol 2525 mg twice a day and has a prescription for furosemide 20 mg as needed.  Blood pressure today on repeat by me is stable at 122/72.  We discussed support stockings.  He is on rosuvastatin 40 mg with target LDL less than 55.  Due to  concerns for sleep apnea, a sleep study confirmed moderate overall sleep apnea although events were severe during REM sleep.  In addition he had central apnea index increased 22.  He is awaiting a titration study.  He has had issues with anxiety and has been treated with as needed Xanax and daily sertraline.  I will see him in 3 to 4 months for follow-up evaluation or sooner as needed.   Medication Adjustments/Labs and Tests Ordered: Current medicines are reviewed at length with the patient today.  Concerns regarding medicines are outlined above.  Medication changes, Labs and Tests ordered today are listed in the Patient Instructions below. Patient Instructions  Medication Instructions:  The current medical regimen is effective;  continue present plan and medications as directed. Please refer to the Current Medication list given to you today.   *If you need a refill on your cardiac medications before your next appointment, please call your pharmacy*  Lab Work:   Testing/Procedures:  NONE    NONE  Follow-Up: Your next appointment:  3-4 month(s) In Person with [name], MD    At [facility], you and your health needs are our priority.  As part of our continuing mission to provide you  with exceptional heart care, we have created designated Provider Care Teams.  These Care Teams include your primary Cardiologist (physician) and Advanced Practice Providers (APPs -  Physician Assistants and Nurse Practitioners) who all work together to provide you with the care you need, when you need it.  We recommend signing up for the patient portal called "MyChart".  Sign up information is provided on this After Visit Summary.  MyChart is used to connect with patients for Virtual Visits (Telemedicine).  Patients are able to view lab/test results, encounter notes, upcoming appointments, etc.  Non-urgent messages can be sent to your provider as well.   To learn more about what you can do with MyChart, go to [email or web address].       Signed, [name], MD  10/15/2021 3:27 PM    [facility] [address], [place], [place], [place]  [address] Phone: [phone]

## 2021-10-15 ENCOUNTER — Encounter: Payer: Self-pay | Admitting: Cardiovascular Disease

## 2021-10-20 ENCOUNTER — Other Ambulatory Visit: Payer: Self-pay

## 2021-10-20 ENCOUNTER — Ambulatory Visit
Admission: EM | Admit: 2021-10-20 | Discharge: 2021-10-20 | Disposition: A | Discharge status: Home / Self Care | Payer: 59 | Attending: Family Medicine | Admitting: Family Medicine

## 2021-10-20 DIAGNOSIS — K047 Periapical abscess without sinus: Secondary | ICD-10-CM

## 2021-10-20 MED ORDER — CHLORHEXIDINE GLUCONATE 0.12 % MT SOLN: 15.0000 mL | Freq: Two times a day (BID) | OROMUCOSAL | 0 refills | Status: DC

## 2021-10-20 MED ORDER — LIDOCAINE VISCOUS HCL 2 % MT SOLN: 10.0000 mL | OROMUCOSAL | 0 refills | Status: DC | PRN

## 2021-10-20 MED ORDER — CLINDAMYCIN HCL 300 MG PO CAPS: 300.0000 mg | ORAL_CAPSULE | Freq: Two times a day (BID) | ORAL | 0 refills | Status: DC

## 2021-10-20 NOTE — ED Provider Notes (Signed)
[facility]    CSN: [id number] Arrival date & time: 10/20/21  0851      History   Chief Complaint Chief Complaint  Patient presents with   Dental Injury    Broke tooth with facial pain and swelling    HPI Barry Schmidt is a 61 y.o. male.   Patient resenting today with 2-day history of severe right lower dental pain, facial swelling, difficulty eating and talking since breaking a molar.  He denies fever, chills, drainage, vomiting, intolerance to p.o.  Taking Tylenol with no relief.  Does not currently have a dentist but is trying to find one.   Past Medical History:  Diagnosis Date   Diabetes mellitus without complication ([place])    On statin therapy due to risk of future cardiovascular event 10/14/2018   OSA (obstructive sleep apnea) 09/16/2021   Central and obstructive: sleep study 09/2021; cpap started. May warrant bipap. Avoid supine position   S/P CABG x 3, 04/2021    Uncontrolled diabetes mellitus without complication, with long-term current use of insulin 06/23/2018   Diagnosed in 2007; was morbidly obese at that time 260 pounds. He lost weight and did well.     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[place]) 10/14/2018   Combined hyperlipidemia associated with type 2 diabetes mellitus ([place]) 10/14/2018   Femoral neuropathy of right lower extremity 10/14/2018    Past Surgical History:  Procedure Laterality Date   CORONARY ARTERY BYPASS GRAFT N/A 04/25/2021   Procedure: CORONARY ARTERY BYPASS GRAFTING (CABG)x 3 ON CARDIOPULMONARY BYPASS USING [place]A, LEFT RADIAL ARTERY AND  GSV.;  Surgeon: Melrose Nakayama, MD;  Location: [facility];  Service:  Open Heart Surgery;  Laterality: N/A;   ENDOVEIN HARVEST OF GREATER SAPHENOUS VEIN Bilateral 04/25/2021   Procedure: ENDOVEIN HARVEST OF GREATER SAPHENOUS VEIN;  Surgeon: Melrose Nakayama, MD;  Location: [facility];  Service: Open Heart Surgery;  Laterality: Bilateral;   LEFT HEART CATH AND CORONARY ANGIOGRAPHY N/A 04/24/2021   Procedure: LEFT HEART CATH AND CORONARY ANGIOGRAPHY;  Surgeon: Troy Sine, MD;  Location: [facility];  Service: Cardiovascular;  Laterality: N/A;   RADIAL ARTERY HARVEST Left 04/25/2021   Procedure: RADIAL ARTERY HARVEST;  Surgeon: Melrose Nakayama, MD;  Location: [facility];  Service: Open Heart Surgery;  Laterality: Left;   TEE WITHOUT CARDIOVERSION N/A 04/25/2021   Procedure: TRANSESOPHAGEAL ECHOCARDIOGRAM (TEE);  Surgeon: Melrose Nakayama, MD;  Location: [facility];  Service: Open Heart Surgery;  Laterality: N/A;       Home Medications    Prior to Admission medications   Medication Sig Start Date End Date Taking? Authorizing Provider  chlorhexidine (PERIDEX) 0.12 % solution Use as directed 15 mLs in the mouth or throat 2 (two) times daily. 10/20/21  Yes Volney American, PA-C  clindamycin (CLEOCIN) 300 MG capsule Take 1 capsule (300 mg total) by mouth 2 (two) times daily. 10/20/21  Yes Volney American, PA-C  lidocaine (XYLOCAINE) 2 % solution Use as directed 10 mLs in the mouth or throat every 3 (three) hours as needed for mouth pain. 10/20/21  Yes Volney American, PA-C  acetaminophen (TYLENOL) 325 MG  tablet Take 2 tablets (650 mg total) by mouth every 6 (six) hours as needed for mild pain. 05/01/21   Barrett, Erin R, PA-C  ALPRAZolam (XANAX) 0.5 MG tablet Take 1 tablet (0.5 mg total) by mouth daily as needed for anxiety. 10/01/21   [name], MD  aspirin EC 81 MG tablet Take 81 mg by mouth at bedtime.    [provider]  Blood Glucose Monitoring Suppl (ONE TOUCH ULTRA 2) w/Device KIT 1 kit by Does not apply route 2 (two)  times daily. 06/23/18   [name], MD  FARXIGA 10 MG TABS tablet TAKE 1 TABLET BY MOUTH EVERY DAY 09/27/21   [name], MD  furosemide (LASIX) 20 MG tablet TAKE 1 TABLET BY MOUTH AS NEEDED 08/05/21   Troy Sine, MD  glucose blood (ACCU-CHEK AVIVA PLUS) test strip USE AS INSTRUCTED TO TEST BLOOD GLUCOSE 06/25/21   [name], MD  Lancets [facility] ULTRASOFT) lancets Use as instructed 06/23/18   [name], MD  metFORMIN (GLUCOPHAGE) 1000 MG tablet Take 1 tablet (1,000 mg total) by mouth 2 (two) times daily with a meal. 10/01/21   [name], MD  metoprolol tartrate (LOPRESSOR) 25 MG tablet Take 1 tablet (25 mg total) by mouth 2 (two) times daily. 10/10/21   Troy Sine, MD  nitroGLYCERIN (NITROSTAT) 0.4 MG SL tablet Place 1 tablet (0.4 mg total) under the tongue every 5 (five) minutes as needed for chest pain. 04/04/21 07/03/21  Troy Sine, MD  rosuvastatin (CRESTOR) 40 MG tablet Take 1 tablet (40 mg total) by mouth at bedtime. 06/25/21   [name], MD  sertraline (ZOLOFT) 50 MG tablet Take 1 tablet (50 mg total) by mouth daily. 10/02/21   [name], MD    Family History Family History  Problem Relation Age of Onset   Heart disease Mother        PTCA with stent   Hypertension Mother    Heart attack Father    Diabetes type I Father    Diabetes Brother    Diabetes Sister    Diabetes Brother    Healthy Son    Healthy Son     Social History Social History   Tobacco Use   Smoking status: Former    Types: Cigarettes    Quit date: 11/07/2019    Years since quitting: 1.9   Smokeless tobacco: Never  Vaping Use   Vaping Use: Never used  Substance Use Topics   Alcohol use: Yes    Comment: Occastional    Drug use: Never     Allergies   Penicillins   Review of Systems Review of Systems Per HPI  Physical Exam Triage Vital Signs ED Triage Vitals  Enc Vitals Group     BP 10/20/21 0958 119/70     Pulse Rate 10/20/21 0958 63     Resp  10/20/21 0958 18     Temp 10/20/21 0958 98.3 F (36.8 C)     Temp Source 10/20/21 0958 Oral     SpO2 10/20/21 0958 96 %     Weight --      Height --      Head Circumference --      Peak Flow --      Pain Score 10/20/21 1001 10     Pain Loc --      Pain Edu? --      Excl. in GC? --    No  data found.  Updated Vital Signs BP 119/70 (BP Location: Right Arm)    Pulse 63    Temp 98.3 F (36.8 C) (Oral)    Resp 18    SpO2 96%   Visual Acuity Right Eye Distance:   Left Eye Distance:   Bilateral Distance:    Right Eye Near:   Left Eye Near:    Bilateral Near:     Physical Exam Vitals and nursing note reviewed.  Constitutional:      Appearance: Normal appearance.  HENT:     Head: Atraumatic.     Mouth/Throat:     Mouth: Mucous membranes are moist.     Pharynx: Posterior oropharyngeal erythema present.     Comments: Broken right lower posterior molar, gingival erythema and edema surrounding.  Facial swelling in this area Eyes:     Extraocular Movements: Extraocular movements intact.     Conjunctiva/sclera: Conjunctivae normal.  Cardiovascular:     Rate and Rhythm: Normal rate and regular rhythm.  Pulmonary:     Effort: Pulmonary effort is normal.     Breath sounds: Normal breath sounds.  Musculoskeletal:        General: Normal range of motion.     Cervical back: Normal range of motion and neck supple.  Skin:    General: Skin is warm and dry.  Neurological:     General: No focal deficit present.     Mental Status: He is oriented to person, place, and time.  Psychiatric:        Mood and Affect: Mood normal.        Thought Content: Thought content normal.        Judgment: Judgment normal.     UC Treatments / Results  Labs (all labs ordered are listed, but only abnormal results are displayed) Labs Reviewed - No data to display  EKG   Radiology No results found.  Procedures Procedures (including critical care time)  Medications Ordered in UC Medications -  No data to display  Initial Impression / Assessment and Plan / UC Course  I have reviewed the triage vital signs and the nursing notes.  Pertinent labs & imaging results that were available during my care of the patient were reviewed by me and considered in my medical decision making (see chart for details).     Treat with clindamycin, Peridex wash, viscous lidocaine, over-the-counter pain relievers until able to follow-up with dentist.  Return for worsening symptoms.  Final Clinical Impressions(s) / UC Diagnoses   Final diagnoses:  Dental infection   Discharge Instructions   None    ED Prescriptions     Medication Sig Dispense Auth. Provider   clindamycin (CLEOCIN) 300 MG capsule Take 1 capsule (300 mg total) by mouth 2 (two) times daily. 14 capsule Volney American, PA-C   lidocaine (XYLOCAINE) 2 % solution Use as directed 10 mLs in the mouth or throat every 3 (three) hours as needed for mouth pain. 100 mL Volney American, PA-C   chlorhexidine (PERIDEX) 0.12 % solution Use as directed 15 mLs in the mouth or throat 2 (two) times daily. 120 mL Volney American, [place]      PDMP not reviewed this encounter.   Volney American, [place] 10/20/21 1055

## 2021-10-20 NOTE — ED Triage Notes (Signed)
Patient states that his bottom back right tooth broke off about 2 days ago.   Patient states that he is unable to talk or eat.  Patient states he tried extra strength Tylenol without any relief

## 2021-10-22 ENCOUNTER — Telehealth: Payer: Self-pay | Admitting: *Deleted

## 2021-10-22 NOTE — Telephone Encounter (Signed)
Prior Authorization for CPAP titration sent to [facility] via web portal. Tracking Number [id number].

## 2021-10-22 NOTE — Telephone Encounter (Signed)
-----   Message from Gaynelle Cage, CMA sent at 10/10/2021  8:41 AM EST ----- CPAP titration

## 2021-11-06 NOTE — Telephone Encounter (Signed)
Received approval from [facility] for CPAP titration. Appointment has been scheduled and patient notified.

## 2021-12-13 ENCOUNTER — Telehealth: Payer: Self-pay | Admitting: *Deleted

## 2021-12-13 ENCOUNTER — Other Ambulatory Visit: Payer: Self-pay

## 2021-12-13 ENCOUNTER — Telehealth: Payer: Self-pay | Admitting: Family Medicine

## 2021-12-13 MED ORDER — ALPRAZOLAM 0.5 MG PO TABS: 0.5000 mg | ORAL_TABLET | Freq: Every day | ORAL | 1 refills | Status: DC | PRN

## 2021-12-13 NOTE — Telephone Encounter (Signed)
Received a call from the patient's wife informing me the CPAP titration scheduled for this Sunday night will need to be rescheduled. The patient's mother unexpectedly passed away last night and they will be leaving for [place]. She was given the contact information for the sleep lab to reschedule. ? ?

## 2021-12-13 NOTE — Telephone Encounter (Signed)
Loaded and ready in the chart. ?

## 2021-12-13 NOTE — Telephone Encounter (Signed)
Pt has had a death in the family and is leaving for funeral on 12/14/21. ? ?.. ?Encourage patient to contact the pharmacy for refills or they can request refills through [facility] ? ?LAST APPOINTMENT DATE:  10/01/21 ? ? ?NEXT APPOINTMENT DATE: 01/01/22 ? ?MEDICATION:ALPRAZolam (XANAX) 0.5 MG tablet ? ?Is the patient out of medication? yes ? ?PHARMACY: ?[phone] - [place], [place] - [address] Phone:  [phone]  ?Fax:  [phone]  ?  ? ? ?Let patient know to contact pharmacy at the end of the day to make sure medication is ready. ? ?Please notify patient to allow 48-72 hours to process  ?

## 2021-12-15 ENCOUNTER — Encounter: Payer: 59 | Admitting: Cardiovascular Disease

## 2021-12-28 ENCOUNTER — Other Ambulatory Visit: Payer: Self-pay | Admitting: Family Medicine

## 2022-01-01 ENCOUNTER — Ambulatory Visit (INDEPENDENT_AMBULATORY_CARE_PROVIDER_SITE_OTHER): Payer: 59 | Admitting: Family Medicine

## 2022-01-01 ENCOUNTER — Encounter: Payer: Self-pay | Admitting: Family Medicine

## 2022-01-01 VITALS — BP 118/60 | HR 53 | Temp 97.4°F | Ht 68.0 in | Wt 205.6 lb

## 2022-01-01 DIAGNOSIS — I2510 Atherosclerotic heart disease of native coronary artery without angina pectoris: Secondary | ICD-10-CM

## 2022-01-01 DIAGNOSIS — E1169 Type 2 diabetes mellitus with other specified complication: Secondary | ICD-10-CM | POA: Diagnosis not present

## 2022-01-01 DIAGNOSIS — L509 Urticaria, unspecified: Secondary | ICD-10-CM

## 2022-01-01 DIAGNOSIS — E1140 Type 2 diabetes mellitus with diabetic neuropathy, unspecified: Secondary | ICD-10-CM | POA: Diagnosis not present

## 2022-01-01 DIAGNOSIS — F4322 Adjustment disorder with anxiety: Secondary | ICD-10-CM

## 2022-01-01 DIAGNOSIS — E782 Mixed hyperlipidemia: Secondary | ICD-10-CM

## 2022-01-01 DIAGNOSIS — G4733 Obstructive sleep apnea (adult) (pediatric): Secondary | ICD-10-CM

## 2022-01-01 LAB — POCT GLYCOSYLATED HEMOGLOBIN (HGB A1C): HbA1c, POC (controlled diabetic range): 6.7 % (ref 0.0–7.0)

## 2022-01-01 MED ORDER — SERTRALINE HCL 100 MG PO TABS: 100.0000 mg | ORAL_TABLET | Freq: Every day | ORAL | 3 refills | Status: DC

## 2022-01-01 NOTE — Progress Notes (Signed)
? ?Subjective  ?CC:  ?Chief Complaint  ?Patient presents with  ? Diabetes  ? Anxiety  ?  States he may need to increase Xanax.  ? Immunizations  ?  He states he was very sick with first Shingles shot.  ? ? ?HPI: Barry Schmidt is a 61 y.o. male who presents to the office today for follow up of diabetes and problems listed above in the chief complaint.  ?Diabetes follow up: His diabetic control is reported as Improved. Diet is fair.  He denies exertional CP or SOB or symptomatic hypoglycemia. He denies foot sores or paresthesias.  ?CAD: reviewed cards notes. Stable CAD. No angina. On BB: heart rate in high 40s at times; asymptomatic.  ?HLD with very low ldl on crestor 40. Tolerating well. ?Adjustment reaction: on sertraline 50 x 3 months and feels it has helped. Still with irritability especially at work. Some anxiety over health remains. Hasn't needed xanax. GAD7 is 9 today. No AE from zoloft. ?Has intermittent allergic urticaria: tomatoes, heat... had flare last week. Benadryl and oral antihistamine, nonsedating resolved them. No sob ? ?Wt Readings from Last 3 Encounters:  ?01/01/22 205 lb 9.6 oz (93.3 kg)  ?10/10/21 205 lb (93 kg)  ?10/01/21 205 lb 1.6 oz (93 kg)  ? ? ?BP Readings from Last 3 Encounters:  ?01/01/22 118/60  ?10/20/21 119/70  ?10/10/21 105/67  ? ? ?  01/01/2022  ?  9:02 AM  ?GAD 7 : Generalized Anxiety Score  ?Nervous, Anxious, on Edge 1  ?Control/stop worrying 2  ?Worry too much - different things 2  ?Trouble relaxing 2  ?Restless 0  ?Easily annoyed or irritable 2  ?Afraid - awful might happen 0  ?Total GAD 7 Score 9  ?Anxiety Difficulty Somewhat difficult  ? ? ? ?Assessment  ?1. Controlled type 2 diabetes mellitus with neuropathy ([place])   ?2. Combined hyperlipidemia associated with type 2 diabetes mellitus ([place])   ?3. Coronary artery disease involving native coronary artery of native heart without angina pectoris   ?4. Adjustment reaction with anxiety   ?5. OSA (obstructive sleep apnea)   ?6.  Urticaria   ? ?  ?Plan  ?Diabetes is currently very well controlled. Continue farxiga 10 and met 1000 bid. And diet.  ?HLD is at goal. Crestor 40 ?CAD is stable. Reassured. Monitor for sxs of brady. Continue bb, statin, asa ?Anxiety: still active but improved. Increase zoloft to 100; counseling done. Recheck 3 months ?OSA: for cpap titration in April.  ?Urticaria: discussed mgt strategies.  ?Will return for shingrix #2; had flu like reaction with 1st.  ? ? ?Follow up: 3 mo for diabetes and mood. ?Orders Placed This Encounter  ?Procedures  ? POCT HgB A1C  ? ?Meds ordered this encounter  ?Medications  ? sertraline (ZOLOFT) 100 MG tablet  ?  Sig: Take 1 tablet (100 mg total) by mouth daily.  ?  Dispense:  90 tablet  ?  Refill:  3  ?  Increasing from 2m dose please  ? ?  ? ?Immunization History  ?Administered Date(s) Administered  ? Influenza,inj,Quad PF,6+ Mos 06/24/2018, 06/03/2019, 06/25/2021  ? Janssen (J&J) SARS-COV-2 Vaccination 02/29/2020  ? PNEUMOCOCCAL CONJUGATE-20 10/01/2021  ? Pneumococcal Polysaccharide-23 10/06/2010  ? Tdap 10/06/2012  ? Zoster Recombinat (Shingrix) 10/01/2021  ? ? ?Diabetes Related Lab Review: ?Lab Results  ?Component Value Date  ? HGBA1C 6.7 01/01/2022  ? HGBA1C 7.1 (A) 10/01/2021  ? HGBA1C 5.9 (A) 06/25/2021  ?  ?Lab Results  ?Component Value Date  ?  MICROALBUR <0.7 06/25/2021  ? ?Lab Results  ?Component Value Date  ? CREATININE 0.83 10/01/2021  ? BUN 16 10/01/2021  ? NA 138 10/01/2021  ? K 3.9 10/01/2021  ? CL 102 10/01/2021  ? CO2 27 10/01/2021  ? ?Lab Results  ?Component Value Date  ? CHOL 88 10/01/2021  ? CHOL 110 04/02/2021  ? CHOL 137 12/09/2019  ? ?Lab Results  ?Component Value Date  ? HDL 49.30 10/01/2021  ? HDL 51.10 04/02/2021  ? HDL 61.10 12/09/2019  ? ?Lab Results  ?Component Value Date  ? [place] 27 10/01/2021  ? [place] 42 04/02/2021  ? [place] 62 12/09/2019  ? ?Lab Results  ?Component Value Date  ? TRIG 60.0 10/01/2021  ? TRIG 87.0 04/02/2021  ? TRIG 71.0 12/09/2019   ? ?Lab Results  ?Component Value Date  ? CHOLHDL 2 10/01/2021  ? CHOLHDL 2 04/02/2021  ? CHOLHDL 2 12/09/2019  ? ?Lab Results  ?Component Value Date  ? LDLDIRECT 90.0 06/23/2018  ? ?The ASCVD Risk score (Arnett DK, et al., 2019) failed to calculate for the following reasons: ?  The valid total cholesterol range is 130 to 320 mg/dL ?I have reviewed the [place], Fam and Soc history. ?Patient Active Problem List  ? Diagnosis Date Noted  ? OSA (obstructive sleep apnea) 09/16/2021  ?  Priority: High  ?  Central and obstructive: sleep study 09/2021; cpap started. May warrant bipap. Avoid supine position ?  ? S/P CABG x 3, 04/2021   ?  Priority: High  ? CAD (coronary artery disease), native coronary artery 04/24/2021  ?  Priority: High  ? Former smoker 04/02/2021  ?  Priority: High  ?  Quit 11/2019 ?  ? Controlled type 2 diabetes mellitus with neuropathy ([place]) 10/14/2018  ?  Priority: High  ?  Diagnosed in 2007; was morbidly obese at that time 260 pounds. He lost weight and did well.  ?Negative urine MAC 07/2018; not on ACE. Normotensive ?  ? Combined hyperlipidemia associated with type 2 diabetes mellitus ([place]) 10/14/2018  ?  Priority: High  ? Adjustment reaction with anxiety 10/01/2021  ?  Priority: Medium   ? Adhesive capsulitis of left shoulder 01/19/2020  ?  Priority: Medium   ? Femoral neuropathy of right lower extremity 10/14/2018  ?  Priority: Medium   ? Urticaria 01/01/2022  ? ? ?Social History: ?Patient  reports that he quit smoking about 2 years ago. His smoking use included cigarettes. He has never used smokeless tobacco. He reports current alcohol use. He reports that he does not use drugs. ? ?Review of Systems: ?Ophthalmic: negative for eye pain, loss of vision or double vision ?Cardiovascular: negative for chest pain ?Respiratory: negative for SOB or persistent cough ?Gastrointestinal: negative for abdominal pain ?Genitourinary: negative for dysuria or gross hematuria ?MSK: negative for foot lesions ?Neurologic:  negative for weakness or gait disturbance ? ?Objective  ?Vitals: BP 118/60   Pulse (!) 53   Temp (!) 97.4 ?F (36.3 ?C) (Temporal)   Ht _0  (1.727 m)   Wt 205 lb 9.6 oz (93.3 kg)   SpO2 96%   BMI 31.26 kg/m?  ?General: well appearing, no acute distress  ?Psych:  Alert and oriented, normal mood and affect ?HEENT:  Normocephalic, atraumatic, moist mucous membranes, supple neck  ?Cardiovascular:  Nl S1 and S2, RRR without murmur, gallop or rub. no edema ?Respiratory:  Good breath sounds bilaterally, CTAB with normal effort, no rales ? ? ?Diabetic education: ongoing education regarding chronic disease  management for diabetes was given today. We continue to reinforce the ABC's of diabetic management: A1c (<7 or 8 dependent upon patient), tight blood pressure control, and cholesterol management with goal LDL < 100 minimally. We discuss diet strategies, exercise recommendations, medication options and possible side effects. At each visit, we review recommended immunizations and preventive care recommendations for diabetics and stress that good diabetic control can prevent other problems. See below for this patient's data. ? ? ?Commons side effects, risks, benefits, and alternatives for medications and treatment plan prescribed today were discussed, and the patient expressed understanding of the given instructions. Patient is instructed to call or message via MyChart if he/she has any questions or concerns regarding our treatment plan. No barriers to understanding were identified. We discussed Red Flag symptoms and signs in detail. Patient expressed understanding regarding what to do in case of urgent or emergency type symptoms.  ?Medication list was reconciled, printed and provided to the patient in AVS. Patient instructions and summary information was reviewed with the patient as documented in the AVS. ?This note was prepared with assistance of Systems analyst. Occasional wrong-word or  sound-a-like substitutions may have occurred due to the inherent limitations of voice recognition software ? ?This visit occurred during the SARS-CoV-2 public health emergency.  Safety protocols were in place, including screeni

## 2022-01-01 NOTE — Patient Instructions (Addendum)
Please return in 3 months for diabetes follow up, we can give you your 2nd Shingrix vaccine at that time as well. ? ?Your farxiga was refilled a few days ago so should be ready. ?I increased the dose of the zoloft for you.  ? ?If you have any questions or concerns, please don't hesitate to send me a message via MyChart or call the office at [phone]. Thank you for visiting with [name] today! It's our pleasure caring for you.  ?

## 2022-01-08 ENCOUNTER — Ambulatory Visit (HOSPITAL_BASED_OUTPATIENT_CLINIC_OR_DEPARTMENT_OTHER): Payer: 59 | Attending: Cardiovascular Disease | Admitting: Cardiovascular Disease

## 2022-01-08 DIAGNOSIS — G4733 Obstructive sleep apnea (adult) (pediatric): Secondary | ICD-10-CM | POA: Insufficient documentation

## 2022-01-16 ENCOUNTER — Encounter (HOSPITAL_BASED_OUTPATIENT_CLINIC_OR_DEPARTMENT_OTHER): Payer: Self-pay | Admitting: Cardiovascular Disease

## 2022-01-16 ENCOUNTER — Telehealth: Payer: Self-pay | Admitting: *Deleted

## 2022-01-16 NOTE — Telephone Encounter (Signed)
-----   Message from Lennette Bihari, MD sent at 01/16/2022  2:14 PM EDT ----- ?Burna Mortimer, please notify pt and set up with DME for CPAP initiation ?

## 2022-01-16 NOTE — Telephone Encounter (Signed)
Patient notified that the CPAP titration study has been completed. CPAP machine pressure obtained and order has been sent to [facility] in [place]. ?

## 2022-01-16 NOTE — Procedures (Signed)
? ? ? ?  Patient Name: Barry Schmidt, Barry Schmidt ?Study Date: 01/08/2022 ?Gender: Male ?D.O.B: Nov 06, 1960 ?Age (years): 73 ?Referring Provider: Joni Reining NP ?Height (inches): 68 ?Interpreting Physician: Nicki Guadalajara MD, ABSM ?Weight (lbs): 206 ?RPSGT: Lowry Ram ?BMI: 31 ?MRN: [id number] ?Neck Size: 15.25 ? ?CLINICAL INFORMATION ?The patient is referred for a CPAP titration to treat sleep apnea. ? ?Date of NPSG: 08/23/2021: AHI 17.7/h; RDI17.7/h; supine AHI 56.2%; O2 nadir 84%; loud snoring ? ?SLEEP STUDY TECHNIQUE ?As per the AASM Manual for the Scoring of Sleep and Associated Events v2.3 (April 2016) with a hypopnea requiring 4% desaturations. ? ?The channels recorded and monitored were frontal, central and occipital EEG, electrooculogram (EOG), submentalis EMG (chin), nasal and oral airflow, thoracic and abdominal wall motion, anterior tibialis EMG, snore microphone, electrocardiogram, and pulse oximetry. Continuous positive airway pressure (CPAP) was initiated at the beginning of the study and titrated to treat sleep-disordered breathing. ? ?MEDICATIONS ?Medications self-administered by patient taken the night of the study : N/A ? ?TECHNICIAN COMMENTS ?Comments added by technician: None ?Comments added by scorer: N/A ? ?RESPIRATORY PARAMETERS ?Optimal PAP Pressure (cm): 13 AHI at Optimal Pressure (/hr): 0 ?Overall Minimal O2 (%): 85.0 Supine % at Optimal Pressure (%): 0 ?Minimal O2 at Optimal Pressure (%): 95.0  ? ?SLEEP ARCHITECTURE ?The study was initiated at 9:58:32 PM and ended at 5:40:32 AM. ? ?Sleep onset time was 15.4 minutes and the sleep efficiency was 91.8%%. The total sleep time was 424 minutes. ? ?The patient spent 5.7%% of the night in stage N1 sleep, 86.0%% in stage N2 sleep, 0.0%% in stage N3 and 8.4% in REM.Stage REM latency was 300.5 minutes ? ?Wake after sleep onset was 22.6. Alpha intrusion was absent. Supine sleep was 40.92%. ? ?CARDIAC DATA ?The 2 lead EKG demonstrated sinus rhythm. The  mean heart rate was 54.5 beats per minute. Other EKG findings include: PVCs. ? ?LEG MOVEMENT DATA ?The total Periodic Limb Movements of Sleep (PLMS) were 0. The PLMS index was 0.0. A PLMS index of <15 is considered normal in adults. ? ?IMPRESSIONS ?- CPAP was initiated at 6 cm and was titrated to optimal PAP pressure at 13 cm of water (AHI 0; RDI 0; O2 nadir 95%). ?- Central sleep apnea was not noted during this titration (CAI = 0.4/h). ?- Moderate oxygen desaturations  to a nadir of 85% at 7 and 10 cm. ?- The patient snored with soft snoring volume during this titration study. ?- 2-lead EKG demonstrated: PVCs ?- Clinically significant periodic limb movements were not noted during this study. Arousals associated with PLMs were significant. ? ?DIAGNOSIS ?- Obstructive Sleep Apnea (G47.33) ? ?RECOMMENDATIONS ?- Recommend an initial trial of CPAP Auto therapy with EPR of 3 at 12 - 16 cm H2O with heated humidification. A Small-Medium size Fisher[employer]Paykel Full Face Evora Full mask was used for the titration. ?- Effort should be made to optimize nasala nd oropharyngeal patency. ?- Avoid alcohol, sedatives and other CNS depressants that may worsen sleep apnea and disrupt normal sleep architecture. ?- Sleep hygiene should be reviewed to assess factors that may improve sleep quality. ?- Weight management (BMI 31) and regular exercise should be initiated or continued. ?- Recommend a download and sleep clinic evaluation after 4 weeks of therapy. ? ?[Electronically signed] 01/16/2022 02:09 PM ? ?Nicki Guadalajara MD, [facility], ABSM ?Diplomate, Biomedical engineer of Sleep Medicine ? ?NPI: [id number] ? ?[facility] ?PH: [phone]   FX: [phone] ?ACCREDITED BY THE AMERICAN ACADEMY OF SLEEP MEDICINE ? ?

## 2022-02-05 ENCOUNTER — Ambulatory Visit: Payer: 59 | Admitting: Cardiovascular Disease

## 2022-03-21 ENCOUNTER — Other Ambulatory Visit: Payer: Self-pay | Admitting: Family Medicine

## 2022-04-02 ENCOUNTER — Encounter: Payer: Self-pay | Admitting: Cardiovascular Disease

## 2022-04-02 ENCOUNTER — Ambulatory Visit (INDEPENDENT_AMBULATORY_CARE_PROVIDER_SITE_OTHER): Payer: 59 | Admitting: Cardiovascular Disease

## 2022-04-02 ENCOUNTER — Ambulatory Visit: Payer: 59 | Admitting: Family Medicine

## 2022-04-02 ENCOUNTER — Ambulatory Visit (INDEPENDENT_AMBULATORY_CARE_PROVIDER_SITE_OTHER): Payer: 59 | Admitting: Family Medicine

## 2022-04-02 ENCOUNTER — Encounter: Payer: Self-pay | Admitting: Family Medicine

## 2022-04-02 VITALS — BP 112/58 | HR 56 | Ht 68.0 in | Wt 215.8 lb

## 2022-04-02 VITALS — BP 110/56 | HR 60 | Temp 98.2°F | Ht 68.0 in | Wt 215.0 lb

## 2022-04-02 DIAGNOSIS — E1140 Type 2 diabetes mellitus with diabetic neuropathy, unspecified: Secondary | ICD-10-CM | POA: Diagnosis not present

## 2022-04-02 DIAGNOSIS — E119 Type 2 diabetes mellitus without complications: Secondary | ICD-10-CM

## 2022-04-02 DIAGNOSIS — I2510 Atherosclerotic heart disease of native coronary artery without angina pectoris: Secondary | ICD-10-CM

## 2022-04-02 DIAGNOSIS — G4733 Obstructive sleep apnea (adult) (pediatric): Secondary | ICD-10-CM | POA: Diagnosis not present

## 2022-04-02 DIAGNOSIS — Z951 Presence of aortocoronary bypass graft: Secondary | ICD-10-CM | POA: Diagnosis not present

## 2022-04-02 DIAGNOSIS — I10 Essential (primary) hypertension: Secondary | ICD-10-CM

## 2022-04-02 DIAGNOSIS — H0263 Xanthelasma of right eye, unspecified eyelid: Secondary | ICD-10-CM

## 2022-04-02 DIAGNOSIS — F4322 Adjustment disorder with anxiety: Secondary | ICD-10-CM

## 2022-04-02 DIAGNOSIS — Z23 Encounter for immunization: Secondary | ICD-10-CM | POA: Diagnosis not present

## 2022-04-02 DIAGNOSIS — R001 Bradycardia, unspecified: Secondary | ICD-10-CM

## 2022-04-02 DIAGNOSIS — E785 Hyperlipidemia, unspecified: Secondary | ICD-10-CM

## 2022-04-02 LAB — POCT GLYCOSYLATED HEMOGLOBIN (HGB A1C): Hemoglobin A1C: 6.9 % — AB (ref 4.0–5.6)

## 2022-04-02 MED ORDER — METOPROLOL TARTRATE 25 MG PO TABS: 12.5000 mg | ORAL_TABLET | Freq: Two times a day (BID) | ORAL | 3 refills | Status: DC

## 2022-04-02 NOTE — Patient Instructions (Signed)
Medication Instructions:  DECREASE: Metoprolol to 12.5 mg (1/2 tablet) twice a day  *If you need a refill on your cardiac medications before your next appointment, please call your pharmacy*   Lab Work: None ordered today   Testing/Procedures: None ordered today   Follow-Up: At [facility], you and your health needs are our priority.  As part of our continuing mission to provide you with exceptional heart care, we have created designated Provider Care Teams.  These Care Teams include your primary Cardiologist (physician) and Advanced Practice Providers (APPs -  Physician Assistants and Nurse Practitioners) who all work together to provide you with the care you need, when you need it.  We recommend signing up for the patient portal called "MyChart".  Sign up information is provided on this After Visit Summary.  MyChart is used to connect with patients for Virtual Visits (Telemedicine).  Patients are able to view lab/test results, encounter notes, upcoming appointments, etc.  Non-urgent messages can be sent to your provider as well.   To learn more about what you can do with MyChart, go to [email or web address].    Your next appointment:   4 month(s)  The format for your next appointment:   In Person  Provider:   Nicki Guadalajara, MD

## 2022-04-02 NOTE — Progress Notes (Signed)
Subjective  CC:  Chief Complaint  Patient presents with   Diabetes    Pt here to F/U with DM. Pt also seems to be experiencing some mood swings on the Zoloft 157m    HPI: Barry Schmidt a 61y.o. male who presents to the office today for follow up of diabetes and problems listed above in the chief complaint.  Diabetes follow up: His diabetic control is reported as Improved. On farxiga and met.  He denies exertional CP or SOB or symptomatic hypoglycemia. He denies foot sores or paresthesias.  CAD: saw DR. Kelly today. Decreasing bb dose due to brady and low bp. Feels sluggish but no cp.  Sleep apnea: on cpap. Working on getting right fit with mask. May warrant bipap. Reviewed pulm notes.  Mood: anxiety is definitely overall improved on zoloft 100. Wife reports more short fused irritability. Pt admits, angry with siblings regarding circumstances after mother's abrupt passing. Seems to be dealing with health problems/ concerns better. No low mood. Reports angry some. Doing much better at work.   Wt Readings from Last 3 Encounters:  04/02/22 215 lb (97.5 kg)  04/02/22 215 lb 12.8 oz (97.9 kg)  01/08/22 206 lb (93.4 kg)    BP Readings from Last 3 Encounters:  04/02/22 (!) 110/56  04/02/22 (!) 112/58  01/01/22 118/60    Assessment  1. Controlled type 2 diabetes mellitus with neuropathy ([place]   2. Need for shingles vaccine   3. Coronary artery disease involving native coronary artery of native heart without angina pectoris   4. Adjustment reaction with anxiety      Plan  Diabetes is currently well controlled. Contineu same meds CAD: stable. Continue meds and decrease bb.  Mood: continue zoloft 100. Counseling recommend : hospice for grief counseling. Pt will consider.  2nd shingrix today. Education given. Had myalgias with first.   Follow up: 6 mo for recheck. Orders Placed This Encounter  Procedures   POCT HgB A1C   No orders of the defined types were placed in this  encounter.     Immunization History  Administered Date(s) Administered   Influenza,inj,Quad PF,6+ Mos 06/24/2018, 06/03/2019, 06/25/2021   Janssen (J&J) SARS-COV-2 Vaccination 02/29/2020   PNEUMOCOCCAL CONJUGATE-20 10/01/2021   Pneumococcal Polysaccharide-23 10/06/2010   Tdap 10/06/2012   Zoster Recombinat (Shingrix) 10/01/2021    Diabetes Related Lab Review: Lab Results  Component Value Date   HGBA1C 6.9 (A) 04/02/2022   HGBA1C 6.7 01/01/2022   HGBA1C 7.1 (A) 10/01/2021    Lab Results  Component Value Date   MICROALBUR <0.7 06/25/2021   Lab Results  Component Value Date   CREATININE 0.83 10/01/2021   BUN 16 10/01/2021   NA 138 10/01/2021   K 3.9 10/01/2021   CL 102 10/01/2021   CO2 27 10/01/2021   Lab Results  Component Value Date   CHOL 88 10/01/2021   CHOL 110 04/02/2021   CHOL 137 12/09/2019   Lab Results  Component Value Date   HDL 49.30 10/01/2021   HDL 51.10 04/02/2021   HDL 61.10 12/09/2019   Lab Results  Component Value Date   LDLCALC 27 10/01/2021   LDLCALC 42 04/02/2021   LDLCALC 62 12/09/2019   Lab Results  Component Value Date   TRIG 60.0 10/01/2021   TRIG 87.0 04/02/2021   TRIG 71.0 12/09/2019   Lab Results  Component Value Date   CHOLHDL 2 10/01/2021   CHOLHDL 2 04/02/2021   CHOLHDL 2 12/09/2019   Lab Results  Component Value Date   LDLDIRECT 90.0 06/23/2018   The ASCVD Risk score (Arnett DK, et al., 2019) failed to calculate for the following reasons:   The valid total cholesterol range is 130 to 320 mg/dL I have reviewed the PMH, Fam and Soc history. Patient Active Problem List   Diagnosis Date Noted   OSA (obstructive sleep apnea) 09/16/2021    Priority: High    Central and obstructive: sleep study 09/2021; cpap started. May warrant bipap. Avoid supine position    S/P CABG x 3, 04/2021     Priority: High   CAD (coronary artery disease), native coronary artery 04/24/2021    Priority: High   Former smoker 04/02/2021     Priority: High    Quit 11/2019    Controlled type 2 diabetes mellitus with neuropathy ([place]) 10/14/2018    Priority: High    Diagnosed in 2007; was morbidly obese at that time 260 pounds. He lost weight and did well.  Negative urine MAC 07/2018; not on ACE. Normotensive    Combined hyperlipidemia associated with type 2 diabetes mellitus ([place]) 10/14/2018    Priority: High   Adjustment reaction with anxiety 10/01/2021    Priority: Medium    Adhesive capsulitis of left shoulder 01/19/2020    Priority: Medium    Femoral neuropathy of right lower extremity 10/14/2018    Priority: Medium    Urticaria 01/01/2022    Social History: Patient  reports that he quit smoking about 2 years ago. His smoking use included cigarettes. He has never used smokeless tobacco. He reports current alcohol use. He reports that he does not use drugs.  Review of Systems: Ophthalmic: negative for eye pain, loss of vision or double vision Cardiovascular: negative for chest pain Respiratory: negative for SOB or persistent cough Gastrointestinal: negative for abdominal pain Genitourinary: negative for dysuria or gross hematuria MSK: negative for foot lesions Neurologic: negative for weakness or gait disturbance  Objective  Vitals: BP (!) 110/56   Pulse 60   Temp 98.2 F (36.8 C)   Ht _0  (1.727 m)   Wt 215 lb (97.5 kg)   SpO2 95%   BMI 32.69 kg/m  General: well appearing, no acute distress  Psych:  Alert and oriented, normal mood and affect Cardiovascular:  Nl S1 and S2, RRR without murmur, gallop or rub. no edema Respiratory:  Good breath sounds bilaterally, CTAB with normal effort, no rales Foot exam: no erythema, pallor, or cyanosis visible nl proprioception and sensation to monofilament testing bilaterally, +2 distal pulses bilaterally    Diabetic education: ongoing education regarding chronic disease management for diabetes was given today. We continue to reinforce the ABC's of diabetic  management: A1c (<7 or 8 dependent upon patient), tight blood pressure control, and cholesterol management with goal LDL < 100 minimally. We discuss diet strategies, exercise recommendations, medication options and possible side effects. At each visit, we review recommended immunizations and preventive care recommendations for diabetics and stress that good diabetic control can prevent other problems. See below for this patient's data.   Commons side effects, risks, benefits, and alternatives for medications and treatment plan prescribed today were discussed, and the patient expressed understanding of the given instructions. Patient is instructed to call or message via MyChart if he/she has any questions or concerns regarding our treatment plan. No barriers to understanding were identified. We discussed Red Flag symptoms and signs in detail. Patient expressed understanding regarding what to do in case of urgent or emergency type symptoms.  Medication list was reconciled, printed and provided to the patient in AVS. Patient instructions and summary information was reviewed with the patient as documented in the AVS. This note was prepared with assistance of Dragon voice recognition software. Occasional wrong-word or sound-a-like substitutions may have occurred due to the inherent limitations of voice recognition software  This visit occurred during the SARS-CoV-2 public health emergency.  Safety protocols were in place, including screening questions prior to the visit, additional usage of staff PPE, and extensive cleaning of exam room while observing appropriate contact time as indicated for disinfecting solutions.

## 2022-04-02 NOTE — Progress Notes (Unsigned)
Cardiology Office Note    Date:  04/03/2022   ID:  Barry Schmidt, DOB 1960/11/10, MRN [id number]  PCP:  [name], MD  Cardiologist:  [name], MD   Follow-up cardiology and sleep evaluation initially referred through the courtesy of Dr. [name] for evaluation of chest tightness   History of Present Illness:  Barry Schmidt is a 61 y.o. male who is originally from [facility].  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  He had recently developed chest tightness particularly while lifting objects and is referred by Dr. [name] for cardiology consultation.  I saw him for my initial evaluation on April 04, 2021.   Mr. Mitcheltree has remote tobacco history and had smoked off and on for 12 years but quit in February 2019.  He has a history of diabetes mellitus for at least 10 years.  He states that he had had episodes of vertigo and staggering shortly following being vaccinated for COVID.  He underwent MRI of his head.  He states that 1 year ago he started to notice more shortness of breath when wearing a mask.  Recently, he has experienced progressive shortness of breath with activity and chest tightness when he is walking and carrying things.  He recently attended [employer] and developed significant chest tightness while walking on his infield carrying a cooler.  He describes a tightness as a pressure at the weight sitting on his chest.  His symptoms dissipate with rest.  He was evaluated by Dr. [name] on April 02, 2021 and due to concern for angina pectoris cardiology consultation was requested.   He has recently started to see a retina specialist for his left eye.  He has a history of hyperlipidemia and was recently started on rosuvastatin.  He has been on Farxiga and metformin for his diabetes mellitus.  He also has a history for neuropathy.  During his initial evaluation with me, I was concerned with his symptoms most likely  representing progressive angina pectoris.  We discussed multiple scenarios of evaluation and after much discussion definitive cardiac catheterization was recommended.  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  There was 50% narrowing in the mid circumflex vessel after marginal branch.  He had significant dampening with each injection into the RCA and had 70% proximal stenosis with distal irregularity.  There was extensive collateralization from the distal RCA via septal collaterals supplying the LAD.  Due to his high-grade complex anatomy CABG revascularization was recommended.  He underwent successful CABG revascularization on April 25, 2020 by Dr. Roxan Hockey with LIMA to the LAD, radial artery to OM 2, and SVG to PDA.  He was discharged on May 01, 2021.  Subsequently, he has been evaluated by Coletta Memos, NP  and Jory Sims, NP in our office.  He had issues with lower extremity edema.  He has had issues with anxiety and stress and panic attacks.   Due to concerns for sleep apnea he was referred for a sleep study which was done at [facility] sleep disorder center from August 23, 2021.  He was found to have moderate overall sleep apnea with AHI of 17.7; no events were severe with supine sleep AHI 56.2/h.  There was loud snoring.  Following his diagnostic study he also was  noted to have central apneic events with a central apnea index of 9.7.  As result, in lab CPAP titration was recommended with potential need for BiPAP or ASV titration if necessary depending upon LV function.    When I saw him on October 10, 2021 he denied any chest pain.  He denies any palpitations.  He is back and working Designer, multimedia.  He has had issues with his left shoulder when he lifts his left arm.  He does notice  occasional left lower extremity edema.  He is now on furosemide 20 mg on an as-needed basis, Toprol tartrate 25 mg twice a day.  He is on [place] and metformin for his diabetes.  He is on sertraline and alprazolam for anxiety.   He underwent a CPAP titration study on January 08, 2022.  He was titrated to 13 cm of water where AHI was 0 and O2 nadir 95%.  He was recommended that he initiate a trial of CPAP auto therapy with EPR of 3 at 12 to 16 cm.  He received a new ResMed AirSense 11 AutoSet unit on January 28, 2022.  A download was obtained in the office today from April 25 through Feb 26, 2022 which shows excellent compliance with 100% use and average use at 9 hours and 28 minutes.  At his 12 to 16 cm pressure range AHI was 6.5 and a central apnea index was 5.5.  His 95th percentile pressure was 13.2.  Presently, he has felt well without recurrent chest pain.  He notes his heart rate decreases and can drop into the 40s when he is sleeping.  He currently is taking metformin 1000 mg twice a day, Farxiga 10 mg, furosemide as needed 20 mg, metoprolol tartrate 25 mg twice a day.  He is on rosuvastatin 40 mg daily and aspirin.  He continues to take Zoloft.  He presents for evaluation.   Past Medical History:  Diagnosis Date   Diabetes mellitus without complication ([place])    On statin therapy due to risk of future cardiovascular event 10/14/2018   OSA (obstructive sleep apnea) 09/16/2021   Central and obstructive: sleep study 09/2021; cpap started. May warrant bipap. Avoid supine position   S/P CABG x 3, 04/2021    Uncontrolled diabetes mellitus without complication, with long-term current use of insulin 06/23/2018   Diagnosed in 2007; was morbidly obese at that time 260 pounds. He lost weight and did well.      Past Surgical History:  Procedure Laterality Date   CORONARY ARTERY BYPASS GRAFT N/A 04/25/2021   Procedure: CORONARY ARTERY BYPASS GRAFTING (CABG)x 3 ON CARDIOPULMONARY BYPASS USING LIMA, LEFT RADIAL  ARTERY AND  GSV.;  Surgeon: Melrose Nakayama, MD;  Location: [place];  Service: Open Heart Surgery;  Laterality: N/A;   ENDOVEIN HARVEST OF GREATER SAPHENOUS VEIN Bilateral 04/25/2021   Procedure: ENDOVEIN HARVEST OF GREATER SAPHENOUS VEIN;  Surgeon: Melrose Nakayama, MD;  Location: [place];  Service: Open Heart Surgery;  Laterality: Bilateral;   LEFT HEART CATH AND CORONARY ANGIOGRAPHY N/A 04/24/2021   Procedure: LEFT HEART CATH AND CORONARY ANGIOGRAPHY;  Surgeon: Troy Sine, MD;  Location: [place] [facility];  Service: Cardiovascular;  Laterality: N/A;   RADIAL ARTERY HARVEST Left 04/25/2021   Procedure: RADIAL ARTERY HARVEST;  Surgeon: Melrose Nakayama, MD;  Location: [place];  Service: Open Heart Surgery;  Laterality: Left;   TEE WITHOUT CARDIOVERSION N/A 04/25/2021   Procedure: TRANSESOPHAGEAL ECHOCARDIOGRAM (TEE);  Surgeon: Modesto Charon  C, MD;  Location: [place];  Service: Open Heart Surgery;  Laterality: N/A;    Current Medications: Outpatient Medications Prior to Visit  Medication Sig Dispense Refill   acetaminophen (TYLENOL) 325 MG tablet Take 2 tablets (650 mg total) by mouth every 6 (six) hours as needed for mild pain.     ALPRAZolam (XANAX) 0.5 MG tablet Take 1 tablet (0.5 mg total) by mouth daily as needed for anxiety. 20 tablet 1   aspirin EC 81 MG tablet Take 81 mg by mouth at bedtime.     Blood Glucose Monitoring Suppl (ONE TOUCH ULTRA 2) w/Device KIT 1 kit by Does not apply route 2 (two) times daily. 1 each 0   FARXIGA 10 MG TABS tablet TAKE 1 TABLET BY MOUTH EVERY DAY 90 tablet 1   furosemide (LASIX) 20 MG tablet TAKE 1 TABLET BY MOUTH AS NEEDED 30 tablet 3   glucose blood (ACCU-CHEK AVIVA PLUS) test strip USE AS INSTRUCTED TO TEST BLOOD GLUCOSE 100 each 12   Lancets (ONETOUCH ULTRASOFT) lancets Use as instructed 100 each 12   metFORMIN (GLUCOPHAGE) 1000 MG tablet Take 1 tablet (1,000 mg total) by mouth 2 (two) times daily with a meal. 180 tablet 3    nitroGLYCERIN (NITROSTAT) 0.4 MG SL tablet Place 1 tablet (0.4 mg total) under the tongue every 5 (five) minutes as needed for chest pain. 25 tablet 6   rosuvastatin (CRESTOR) 40 MG tablet TAKE 1 TABLET BY MOUTH EVERYDAY AT BEDTIME 90 tablet 3   sertraline (ZOLOFT) 100 MG tablet Take 1 tablet (100 mg total) by mouth daily. 90 tablet 3   metoprolol tartrate (LOPRESSOR) 25 MG tablet Take 1 tablet (25 mg total) by mouth 2 (two) times daily. 180 tablet 6   chlorhexidine (PERIDEX) 0.12 % solution Use as directed 15 mLs in the mouth or throat 2 (two) times daily. (Patient not taking: Reported on 04/02/2022) 120 mL 0   clindamycin (CLEOCIN) 300 MG capsule Take 1 capsule (300 mg total) by mouth 2 (two) times daily. (Patient not taking: Reported on 04/02/2022) 14 capsule 0   lidocaine (XYLOCAINE) 2 % solution Use as directed 10 mLs in the mouth or throat every 3 (three) hours as needed for mouth pain. (Patient not taking: Reported on 04/02/2022) 100 mL 0   No facility-administered medications prior to visit.     Allergies:   Penicillins   Social History   Socioeconomic History   Marital status: Married    Spouse name: [name]   Number of children: 2   Years of education: Not on file   Highest education level: Not on file  Occupational History   Occupation: Freight forwarder, Academic librarian: [employer]  Tobacco Use   Smoking status: Former    Types: Cigarettes    Quit date: 11/07/2019    Years since quitting: 2.4   Smokeless tobacco: Never  Vaping Use   Vaping Use: Never used  Substance and Sexual Activity   Alcohol use: Yes    Comment: Occastional    Drug use: Never   Sexual activity: Yes    Partners: Female  Other Topics Concern   Not on file  Social History Narrative   Right handed    Caffeine 4 daily    Lives at home with wife ( [name]) and child.   Social Determinants of Health   Financial Resource Strain: Not on file  Food Insecurity: Not on file  Transportation Needs: Not on file  Physical  Activity: Not  on file  Stress: Not on file  Social Connections: Not on file    Socially he was born in [place].  He is in his second marriage of 51 years..  His first marriage lasted 30 years.  He has 2 children ages 73 and 75 and 2 grandchildren ages 34 and 48.  He started college but never finished.  He does not routinely exercise but is states while at work he often walks up to 18,000 steps per day.   Family History:  The patient's family history includes Diabetes in his brother, brother, and sister; Diabetes type I in his father; Healthy in his son and son; Heart attack in his father; Heart disease in his mother; Hypertension in his mother.   His mother is alive at age 37.  Father died at 55 and had diabetes and heart problems and was status post pacemaker insertion.  1 brother died at 17 and had suffered a myocardial infarction in his 71s.  He has 3 living brothers in their 68s and 2 sisters ages 14 and 22 both with diabetes.  ROS General: Negative; No fevers, chills, or night sweats;  HEENT: Recent left eye problem seeing a retina specialist.  No changes in hearing, sinus congestion, difficulty swallowing Pulmonary: Negative; No cough, wheezing, shortness of breath, hemoptysis Cardiovascular: See HPI GI: Negative; No nausea, vomiting, diarrhea, or abdominal pain GU: Negative; No dysuria, hematuria, or difficulty voiding Musculoskeletal: Left shoulder discomfort Hematologic/Oncology: Negative; no easy bruising, bleeding Endocrine: Negative; no heat/cold intolerance; no diabetes Neuro: Negative; no changes in balance, headaches Skin: Negative; No rashes or skin lesions Psychiatric: Negative; No behavioral problems, depression Sleep: Positive for snoring, recently diagnosed moderate sleep apnea overall, severe during supine sleep.  Now on CPAP therapy;  no bruxism, restless legs, hypnogognic hallucinations, no cataplexy Other comprehensive 14 point system review is  negative.   PHYSICAL EXAM:   VS:  BP (!) 112/58   Pulse (!) 56   Ht 5' 8"  (1.727 m)   Wt 215 lb 12.8 oz (97.9 kg)   SpO2 98%   BMI 32.81 kg/m     Repeat blood pressure by me was 120/64  Wt Readings from Last 3 Encounters:  04/02/22 215 lb (97.5 kg)  04/02/22 215 lb 12.8 oz (97.9 kg)  01/08/22 206 lb (93.4 kg)    General: Alert, oriented, no distress.  Skin: normal turgor, no rashes, warm and dry HEENT: Normocephalic, atraumatic. Pupils equal round and reactive to light; sclera anicteric; extraocular muscles intact; improved xanthelasma over the right eye Nose without nasal septal hypertrophy Mouth/Parynx benign; Mallinpatti scale 3 Neck: No JVD, no carotid bruits; normal carotid upstroke Lungs: clear to ausculatation and percussion; no wheezing or rales Chest wall: without tenderness to palpitation Heart: PMI not displaced, RRR, s1 s2 normal, 1/6 systolic murmur, no diastolic murmur, no rubs, gallops, thrills, or heaves Abdomen: soft, nontender; no hepatosplenomehaly, BS+; abdominal aorta nontender and not dilated by palpation. Back: no CVA tenderness Pulses 2+ Musculoskeletal: full range of motion, normal strength, no joint deformities Extremities: no clubbing cyanosis or edema, Homan's sign negative  Neurologic: grossly nonfocal; Cranial nerves grossly wnl Psychologic: Normal mood and affect    Studies/Labs Reviewed:    April 02, 2022 ECG (independently read by me): Sinus bradycardia at 56 bpm, left axis deviation.  Incomplete right bundle branch block  October 10, 2021 ECG (independently read by me):Sinus bradycardia at 53; IRBBB, Nonspecific T changes   April 04, 2021 ECG (independently read by me): NSR  at 62, IRBBB, PRWP   Recent Labs:    Latest Ref Rng & Units 10/01/2021    9:08 AM 07/12/2021    2:56 PM 06/18/2021    9:02 AM  BMP  Glucose 70 - 99 mg/dL 124  119  121   BUN 6 - 23 mg/dL 16  14  18    Creatinine 0.40 - 1.50 mg/dL 0.83  0.90  0.79   BUN/Creat Ratio  9 - 20  16  23    Sodium 135 - 145 mEq/L 138  139  140   Potassium 3.5 - 5.1 mEq/L 3.9  4.7  4.5   Chloride 96 - 112 mEq/L 102  101  101   CO2 19 - 32 mEq/L 27  22  23    Calcium 8.4 - 10.5 mg/dL 9.1  9.5  9.2         Latest Ref Rng & Units 10/01/2021    9:08 AM 04/25/2021   10:00 PM 04/22/2021    9:28 AM  Hepatic Function  Total Protein 6.0 - 8.3 g/dL 7.1  5.2  6.7   Albumin 3.5 - 5.2 g/dL 4.4  3.3  4.5   AST 0 - 37 U/L 18  30  17    ALT 0 - 53 U/L 17  19  19    Alk Phosphatase 39 - 117 U/L 63  41  73   Total Bilirubin 0.2 - 1.2 mg/dL 0.4  0.8  <0.2        Latest Ref Rng & Units 10/01/2021    9:08 AM 04/27/2021    3:51 AM 04/26/2021    4:59 PM  CBC  WBC 4.0 - 10.5 K/uL 5.2  7.7  12.6   Hemoglobin 13.0 - 17.0 g/dL 13.6  9.5  11.1   Hematocrit 39.0 - 52.0 % 40.4  29.7  33.5   Platelets 150.0 - 400.0 K/uL 195.0  132  158    Lab Results  Component Value Date   MCV 80.5 10/01/2021   MCV 89.5 04/27/2021   MCV 88.2 04/26/2021   Lab Results  Component Value Date   TSH 3.690 04/22/2021   Lab Results  Component Value Date   HGBA1C 6.9 (A) 04/02/2022     BNP No results found for: "BNP"  ProBNP No results found for: "PROBNP"   Lipid Panel     Component Value Date/Time   CHOL 88 10/01/2021 0908   TRIG 60.0 10/01/2021 0908   HDL 49.30 10/01/2021 0908   CHOLHDL 2 10/01/2021 0908   VLDL 12.0 10/01/2021 0908   LDLCALC 27 10/01/2021 0908   LDLDIRECT 90.0 06/23/2018 1113     RADIOLOGY: No results found.   Additional studies/ records that were reviewed today include:  I reviewed the recent evaluation by Dr. [name].  I also reviewed his [facility] room evaluation in 2019 when he presented with right leg pain and was felt to have sciatica.    Prox RCA lesion is 70% stenosed.   RPAV lesion is 50% stenosed.   Dist RCA lesion is 50% stenosed.   Ost LAD to Prox LAD lesion is 100% stenosed.   Mid LM to Dist LM lesion is 85% stenosed.   Ramus lesion is  80% stenosed.   Ost Cx lesion is 70% stenosed.   Mid Cx to Dist Cx lesion is 50% stenosed.   There is moderate left ventricular systolic dysfunction.   LV end diastolic pressure is normal.   Severe multivessel CAD with 85%  on distal left main stenosis; total ostial occlusion of a large LAD; 80% ostial ramus intermediate stenosis and 70% ostial circumflex stenosis.  There is 50% narrowing in the mid left circumflex after marginal branch.  There is significant dampening with each injection into the RCA.  There is 70% proximal RCA stenoses with 50% irregularity distally.  There is extensive collateralization from the distal RCA via septal collaterals supplying the LAD.   RECOMMENDATION: Patient will be admitted with plans for surgical consultation for CABG revascularization surgery due to high-grade left main stenosis.   Intervention   CLINICAL INFORMATION Sleep Study Type: NPSG   Indication for sleep study: N/A   Epworth Sleepiness Score: 7   SLEEP STUDY TECHNIQUE As per the AASM Manual for the Scoring of Sleep and Associated Events v2.3 (April 2016) with a hypopnea requiring 4% desaturations.   The channels recorded and monitored were frontal, central and occipital EEG, electrooculogram (EOG), submentalis EMG (chin), nasal and oral airflow, thoracic and abdominal wall motion, anterior tibialis EMG, snore microphone, electrocardiogram, and pulse oximetry.   MEDICATIONS furosemide (LASIX) 20 MG tablet acetaminophen (TYLENOL) 325 MG tablet ALPRAZolam (XANAX) 0.5 MG tablet aspirin EC 81 MG tablet azithromycin (ZITHROMAX) 250 MG tablet Blood Glucose Monitoring Suppl (ONE TOUCH ULTRA 2) w/Device KIT dapagliflozin propanediol (FARXIGA) 10 MG TABS tablet glucose blood (ACCU-CHEK AVIVA PLUS) test strip Lancets (ONETOUCH ULTRASOFT) lancets metFORMIN (GLUCOPHAGE) 1000 MG tablet metoprolol tartrate (LOPRESSOR) 25 MG tablet nitroGLYCERIN (NITROSTAT) 0.4 MG SL tablet (Expired) rosuvastatin  (CRESTOR) 40 MG tablet Medications self-administered by patient taken the night of the study : N/A   SLEEP ARCHITECTURE The study was initiated at 9:15:29 PM and ended at 5:28:00 AM.   Sleep onset time was 35.2 minutes and the sleep efficiency was 79.2%. The total sleep time was 390 minutes.   Stage REM latency was 144.5 minutes.   The patient spent 5.90% of the night in stage N1 sleep, 75.90% in stage N2 sleep, 1.41% in stage N3 and 16.8% in REM.   Alpha intrusion was absent.   Supine sleep was 28.20%.   RESPIRATORY PARAMETERS The overall apnea/hypopnea index (AHI) was 17.7 per hour. The respiraory disturbance index (RDI) was 17.7/h. There were 65 total apneas, including 2 obstructive, 63 central and 0 mixed apneas. There were 50 hypopneas and 0 RERAs.   The AHI during Stage REM sleep was 1.8 per hour.   AHI while supine was 56.2 per hour.   The mean oxygen saturation was 93.81%. The minimum SpO2 during sleep was 84.00%.   Loud snoring was noted during this study.   CARDIAC DATA The 2 lead EKG demonstrated sinus rhythm. The mean heart rate was 66.12 beats per minute. Other EKG findings include: None.   LEG MOVEMENT DATA The total PLMS were 0 with a resulting PLMS index of 0.00. Associated arousal with leg movement index was 0.0 .   IMPRESSIONS - Moderate obstructive sleep apnea overall (AHI 17.7/h; RDI 17.7): however, sleep apnea was severe with supine sleep (AHI 56.2/h). - Mild central sleep apnea occurred during this study (CAI = 9.7/h). - Mild oxygen desaturation to a nadir of 84%. - The patient snored with loud snoring volume. - No cardiac abnormalities were noted during this study. - Clinically significant periodic limb movements did not occur during sleep. No significant associated arousals.   DIAGNOSIS - Obstructive Sleep Apnea (G47.33) - Central Sleep Apnea (G47.37)   RECOMMENDATIONS - In-lab CPAP titration to determine optimal pressure required to alleviate  sleep disordered breathing.  BiPAP or ASV titration may be required to eliminate central sleep apnea. - Effort should be made to optimize nasal and oropharyngeal patency. - Positional therapy avoiding supine position during sleep. - Avoid alcohol, sedatives and other CNS depressants that may worsen sleep apnea and disrupt normal sleep architecture. - Sleep hygiene should be reviewed to assess factors that may improve sleep quality. - Weight management (BMI 33) and regular exercise should be initiated or continued if appropriate.    ASSESSMENT:    1. Coronary artery disease involving native coronary artery of native heart without angina pectoris   2. S/P CABG x 3, 04/2021   3. OSA (obstructive sleep apnea)   4. Essential hypertension   5. Bradycardia   6. Type 2 diabetes mellitus without complication, without long-term current use of insulin (HCC)   7. Hyperlipidemia with target LDL less than 55   8. Xanthelasma of right eyelid     PLAN:  Mr. Dax Murguia is a 61 year-old gentleman who has a prior tobacco history and fortunately quit on his birthday November 25, 2017.   He has a history of diabetes mellitus and on exam he has evidence for xanthelasma above his right eye.  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  When I initially saw him he had developed exertional chest tightness particularly when carrying objects.  His symptoms became fairly intense when he was at [facility] and was carrying a cooler across the infield where he had to stop on several times to catch his breath.  He denied any resting chest wall discomfort.  Cardiac risk factors are notable for prior tobacco use, diabetes mellitus, hyperlipidemia, and family history for CAD.  Definitive cardiac catheterization revealed severe multivessel CAD with high-grade left main as well as ostial disease.  Following day he underwent successful CABG revascularization by Dr. Roxan Hockey and  LIMA placed to his LAD, radial artery to OM 2, and SVG to PDA.  Subsequently has had some issues with lower extremity edema which improved with diuretic therapy.  Presently, he feels well from a cardiac standpoint.  He is not having any chest pain or shortness of breath.  Apparently his pulse has been getting low and at times may drop into the mid to upper 40s while sleeping.  He has been on metoprolol tartrate 25 mg twice a day I have suggested he reduce this to 12.5 mg twice a day.  Due to my concerns for obstructive sleep apnea he underwent a sleep evaluation at which confirmed moderate overall sleep apnea which was severe with supine sleep.  Of note, on his diagnostic evaluation he did have central events.  He underwent CPAP titration.  He is meeting compliance standards since his set up date of January 28, 2022.  Currently he has set at a range of 12 to 16 cm with 95th percentile pressure at 13.2 and maximum average pressure 13.8.  His AHI is 6.5 with an apnea index of 6.3 and hypopnea index of 0.2.  He was having central apnea index of 5.5.  Presently I am recommending he decrease his start pressure to a range of 10 to 16 cm.  I will decrease his metoprolol in the event bradycardia is playing a role.  Following his diagnostic study I did suggest the potential need for future BiPAP or ASV therapy depending upon his central events.  He still has some mild residual daytime sleepiness and an Epworth Sleepiness Scale  score was calculated in the office today and this endorsed at 10.  Lipid studies are excellent on laboratory December 2022 with LDL cholesterol at 47.  Renal function is stable with creatinine 0.83.  Hemoglobin A1c from March 2023 was 6.7.  I will see him in 4 months for reevaluation or sooner as needed.   Medication Adjustments/Labs and Tests Ordered: Current medicines are reviewed at length with the patient today.  Concerns regarding medicines are outlined above.  Medication changes, Labs and Tests  ordered today are listed in the Patient Instructions below. Patient Instructions  Medication Instructions:  DECREASE: Metoprolol to 12.5 mg (1/2 tablet) twice a day  *If you need a refill on your cardiac medications before your next appointment, please call your pharmacy*   Lab Work: None ordered today   Testing/Procedures: None ordered today   Follow-Up: At [facility], you and your health needs are our priority.  As part of our continuing mission to provide you with exceptional heart care, we have created designated Provider Care Teams.  These Care Teams include your primary Cardiologist (physician) and Advanced Practice Providers (APPs -  Physician Assistants and Nurse Practitioners) who all work together to provide you with the care you need, when you need it.  We recommend signing up for the patient portal called "MyChart".  Sign up information is provided on this After Visit Summary.  MyChart is used to connect with patients for Virtual Visits (Telemedicine).  Patients are able to view lab/test results, encounter notes, upcoming appointments, etc.  Non-urgent messages can be sent to your provider as well.   To learn more about what you can do with MyChart, go to [email or web address].    Your next appointment:   4 month(s)  The format for your next appointment:   In Person  Provider:   [name], MD              Signed, [name], MD  04/03/2022 8:39 PM    [facility] [address] [place], [place], [place]  [address] Phone: [phone]

## 2022-04-03 ENCOUNTER — Encounter: Payer: Self-pay | Admitting: Cardiovascular Disease

## 2022-04-03 ENCOUNTER — Ambulatory Visit: Payer: 59 | Admitting: Family Medicine

## 2022-06-28 ENCOUNTER — Other Ambulatory Visit: Payer: Self-pay | Admitting: Family Medicine

## 2022-07-12 ENCOUNTER — Other Ambulatory Visit: Payer: Self-pay | Admitting: Cardiovascular Disease

## 2022-07-25 ENCOUNTER — Ambulatory Visit: Payer: 59 | Attending: Cardiovascular Disease | Admitting: Cardiovascular Disease

## 2022-07-25 ENCOUNTER — Other Ambulatory Visit: Payer: Self-pay | Admitting: Cardiovascular Disease

## 2022-07-25 ENCOUNTER — Encounter: Payer: Self-pay | Admitting: Cardiovascular Disease

## 2022-07-25 VITALS — BP 116/72 | HR 59 | Ht 68.0 in | Wt 208.0 lb

## 2022-07-25 DIAGNOSIS — I2510 Atherosclerotic heart disease of native coronary artery without angina pectoris: Secondary | ICD-10-CM

## 2022-07-25 DIAGNOSIS — E785 Hyperlipidemia, unspecified: Secondary | ICD-10-CM

## 2022-07-25 DIAGNOSIS — R001 Bradycardia, unspecified: Secondary | ICD-10-CM

## 2022-07-25 DIAGNOSIS — H0263 Xanthelasma of right eye, unspecified eyelid: Secondary | ICD-10-CM

## 2022-07-25 DIAGNOSIS — G4733 Obstructive sleep apnea (adult) (pediatric): Secondary | ICD-10-CM

## 2022-07-25 DIAGNOSIS — I10 Essential (primary) hypertension: Secondary | ICD-10-CM | POA: Diagnosis not present

## 2022-07-25 DIAGNOSIS — Z951 Presence of aortocoronary bypass graft: Secondary | ICD-10-CM | POA: Diagnosis not present

## 2022-07-25 DIAGNOSIS — G4731 Primary central sleep apnea: Secondary | ICD-10-CM

## 2022-07-25 NOTE — Patient Instructions (Addendum)
Medication Instructions:  The current medical regimen is effective;  continue present plan and medications as directed. Please refer to the Current Medication list given to you today.  *If you need a refill on your cardiac medications before your next appointment, please call your pharmacy*   Lab Work: NONE  Testing/Procedures: BIPAP/ASV TITRATION IN THE SLEEP LAB SOMEONE WILL CALL YOU TO SCHEDULE  Follow-Up: At [facility], you and your health needs are our priority.  As part of our continuing mission to provide you with exceptional heart care, we have created designated Provider Care Teams.  These Care Teams include your primary Cardiologist (physician) and Advanced Practice Providers (APPs -  Physician Assistants and Nurse Practitioners) who all work together to provide you with the care you need, when you need it.  Your next appointment:   3-4 month(s)  The format for your next appointment:   In Person  Provider:   [name], MD    Important Information About Sugar

## 2022-07-25 NOTE — Progress Notes (Signed)
Cardiology Office Note    Date:  08/01/2022   ID:  Barry Schmidt, DOB [birth year], MRN [id number]  PCP:  [name], MD  Cardiologist:  [name], MD   4 month follow-up cardiology and sleep evaluation initially referred through the courtesy of Dr. [name] for evaluation of chest tightness   History of Present Illness:  Barry Schmidt is a 61 y.o. male who is originally from [facility].  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  He had  developed chest tightness particularly while lifting objects and is referred by Dr. [name] for cardiology consultation.  I saw him for my initial evaluation on April 04, 2021.   Barry Schmidt has remote tobacco history and had smoked off and on for 12 years but quit in February 2019.  He has a history of diabetes mellitus for at least 10 years.  He states that he had had episodes of vertigo and staggering shortly following being vaccinated for COVID.  He underwent MRI of his head.  He states that 1 year ago he started to notice more shortness of breath when wearing a mask.  Recently, he has experienced progressive shortness of breath with activity and chest tightness when he is walking and carrying things.  He recently attended [employer] and developed significant chest tightness while walking on his infield carrying a cooler.  He describes a tightness as a pressure at the weight sitting on his chest.  His symptoms dissipate with rest.  He was evaluated by Dr. [name] on April 02, 2021 and due to concern for angina pectoris cardiology consultation was requested.   He has recently started to see a retina specialist for his left eye.  He has a history of hyperlipidemia and was recently started on rosuvastatin.  He has been on Farxiga and metformin for his diabetes mellitus.  He also has a history for neuropathy.  During his initial evaluation with me, I was concerned with his symptoms  most likely representing progressive angina pectoris.  We discussed multiple scenarios of evaluation and after much discussion definitive cardiac catheterization was recommended.  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  There was 50% narrowing in the mid circumflex vessel after marginal branch.  He had significant dampening with each injection into the RCA and had 70% proximal stenosis with distal irregularity.  There was extensive collateralization from the distal RCA via septal collaterals supplying the LAD.  Due to his high-grade complex anatomy CABG revascularization was recommended.  He underwent successful CABG revascularization on April 25, 2020 by Dr. Roxan Hockey with LIMA to the LAD, radial artery to OM 2, and SVG to PDA.  He was discharged on May 01, 2021.  Subsequently, he has been evaluated by Coletta Memos, NP  and Jory Sims, NP in our office.  He had issues with lower extremity edema.  He has had issues with anxiety and stress and panic attacks.   Due to concerns for sleep apnea he was referred for a sleep study which was done at [facility] from August 23, 2021.  He was found to have moderate overall sleep apnea with AHI of 17.7; no events were severe with supine sleep AHI 56.2/h.  There was loud  snoring.  Following his diagnostic study he also was noted to have central apneic events with a central apnea index of 9.7.  As result, in lab CPAP titration was recommended with potential need for BiPAP or ASV titration if necessary depending upon LV function.    When I saw him on October 10, 2021 he denied any chest pain.  He denies any palpitations.  He is back and working Designer, multimedia.  He has had issues with his left shoulder when he lifts his left arm.  He does  notice occasional left lower extremity edema.  He is now on furosemide 20 mg on an as-needed basis, Toprol tartrate 25 mg twice a day.  He is on [place] and metformin for his diabetes.  He is on sertraline and alprazolam for anxiety.   I last saw him on April 02, 2022.  He underwent a CPAP titration study on January 08, 2022.  He was titrated to 13 cm of water where AHI was 0 and O2 nadir 95%.  He was recommended that he initiate a trial of CPAP auto therapy with EPR of 3 at 12 to 16 cm.  He received a new ResMed AirSense 11 AutoSet unit on January 28, 2022.  A download was obtained in the office today from April 25 through Feb 26, 2022 which showed excellent compliance with 100% use and average use at 9 hours and 28 minutes.  At his 12 to 16 cm pressure range AHI was 6.5 and a central apnea index was 5.5.  His 95th percentile pressure was 13.2.  Presently, he has felt well without recurrent chest pain.  He notes his heart rate decreases and can drop into the 40s when he is sleeping.  He currently is taking metformin 1000 mg twice a day, Farxiga 10 mg, furosemide as needed 20 mg, metoprolol tartrate 25 mg twice a day.  He is on rosuvastatin 40 mg daily and aspirin.  He continues to take Zoloft.  During that evaluation, he was bradycardic and metoprolol dose was slightly reduced.  I was concerned about a central apnea index of 5.5 and recommended he decrease his start pressure and to have a range of 10 to 16 cm of water.  I discussed potential future need for possible BiPAP or ASV titration depending upon subsequent central events.  Since I last saw him, he has continued to use CPAP at his pressure range of 10 to 16 cm of water.  His 95th percentile pressure is 12.0 with maximum average pressure 13.2.  AHI is 8.1 but his central apnea index is 7.0 with obstructive apnea index at 0.4.  He denies any chest tightness or pressure but occasionally experiences some chest tingling which is nonexertional.  He continues to be  on a medical regimen of Farxiga 10 mg and metformin 1000 mg for his diabetes mellitus.  He is now on metoprolol tartrate 12.5 mg twice a day and continues to be on rosuvastatin 40 mg daily for hyper lipidemia.  He is on sertraline.  An Epworth Sleepiness Scale score was recalculated in the office today and this endorsed at 10 suggestive of borderline residual daytime sleepiness.  He presents for evaluation.  Past Medical History:  Diagnosis Date   Diabetes mellitus without complication ([place])    On statin therapy due to risk of future cardiovascular event 10/14/2018   OSA (obstructive sleep apnea) 09/16/2021   Central and obstructive: sleep study 09/2021; cpap started. May warrant bipap. Avoid supine position  S/P CABG x 3, 04/2021    Uncontrolled diabetes mellitus without complication, with long-term current use of insulin 06/23/2018   Diagnosed in 2007; was morbidly obese at that time 260 pounds. He lost weight and did well.      Past Surgical History:  Procedure Laterality Date   CORONARY ARTERY BYPASS GRAFT N/A 04/25/2021   Procedure: CORONARY ARTERY BYPASS GRAFTING (CABG)x 3 ON CARDIOPULMONARY BYPASS USING LIMA, LEFT RADIAL ARTERY AND  GSV.;  Surgeon: Melrose Nakayama, MD;  Location: [facility];  Service: Open Heart Surgery;  Laterality: N/A;   ENDOVEIN HARVEST OF GREATER SAPHENOUS VEIN Bilateral 04/25/2021   Procedure: ENDOVEIN HARVEST OF GREATER SAPHENOUS VEIN;  Surgeon: Melrose Nakayama, MD;  Location: [facility];  Service: Open Heart Surgery;  Laterality: Bilateral;   LEFT HEART CATH AND CORONARY ANGIOGRAPHY N/A 04/24/2021   Procedure: LEFT HEART CATH AND CORONARY ANGIOGRAPHY;  Surgeon: Troy Sine, MD;  Location: [facility];  Service: Cardiovascular;  Laterality: N/A;   RADIAL ARTERY HARVEST Left 04/25/2021   Procedure: RADIAL ARTERY HARVEST;  Surgeon: Melrose Nakayama, MD;  Location: [facility];  Service: Open Heart Surgery;  Laterality: Left;   TEE WITHOUT CARDIOVERSION  N/A 04/25/2021   Procedure: TRANSESOPHAGEAL ECHOCARDIOGRAM (TEE);  Surgeon: Melrose Nakayama, MD;  Location: [facility];  Service: Open Heart Surgery;  Laterality: N/A;   TONSILECTOMY/ADENOIDECTOMY WITH MYRINGOTOMY  2004   Uvilaectomy.    Current Medications: Outpatient Medications Prior to Visit  Medication Sig Dispense Refill   ACCU-CHEK AVIVA PLUS test strip USE AS INSTRUCTED TO TEST BLOOD GLUCOSE 100 strip 12   acetaminophen (TYLENOL) 325 MG tablet Take 2 tablets (650 mg total) by mouth every 6 (six) hours as needed for mild pain.     ALPRAZolam (XANAX) 0.5 MG tablet Take 1 tablet (0.5 mg total) by mouth daily as needed for anxiety. 20 tablet 1   aspirin EC 81 MG tablet Take 81 mg by mouth at bedtime.     Blood Glucose Monitoring Suppl (ONE TOUCH ULTRA 2) w/Device KIT 1 kit by Does not apply route 2 (two) times daily. 1 each 0   FARXIGA 10 MG TABS tablet TAKE 1 TABLET BY MOUTH EVERY DAY 90 tablet 1   furosemide (LASIX) 20 MG tablet TAKE 1 TABLET BY MOUTH EVERY DAY AS NEEDED 90 tablet 1   Lancets (ONETOUCH ULTRASOFT) lancets Use as instructed 100 each 12   metFORMIN (GLUCOPHAGE) 1000 MG tablet Take 1 tablet (1,000 mg total) by mouth 2 (two) times daily with a meal. 180 tablet 3   metoprolol tartrate (LOPRESSOR) 25 MG tablet Take 0.5 tablets (12.5 mg total) by mouth 2 (two) times daily. 90 tablet 3   rosuvastatin (CRESTOR) 40 MG tablet TAKE 1 TABLET BY MOUTH EVERYDAY AT BEDTIME 90 tablet 3   sertraline (ZOLOFT) 100 MG tablet Take 1 tablet (100 mg total) by mouth daily. 90 tablet 3   nitroGLYCERIN (NITROSTAT) 0.4 MG SL tablet Place 1 tablet (0.4 mg total) under the tongue every 5 (five) minutes as needed for chest pain. 25 tablet 6   No facility-administered medications prior to visit.     Allergies:   Penicillins   Social History   Socioeconomic History   Marital status: Married    Spouse name: [name]   Number of children: 2   Years of education: Not on file   Highest education  level: Not on file  Occupational History   Occupation: Freight forwarder, Academic librarian: [employer]  Tobacco Use   Smoking status: Former    Types: Cigarettes    Quit date: 11/07/2019    Years since quitting: 2.7   Smokeless tobacco: Never  Vaping Use   Vaping Use: Never used  Substance and Sexual Activity   Alcohol use: Yes    Comment: Occastional    Drug use: Never   Sexual activity: Yes    Partners: Female  Other Topics Concern   Not on file  Social History Narrative   Right handed    Caffeine 4 daily    Lives at home with wife ( [name]) and child.   Social Determinants of Health   Financial Resource Strain: Not on file  Food Insecurity: Not on file  Transportation Needs: Not on file  Physical Activity: Not on file  Stress: Not on file  Social Connections: Not on file    Socially he was born in [place].  He is in his second marriage of 43 years..  His first marriage lasted 58 years.  He has 2 children ages 8 and 43 and 2 grandchildren ages 33 and 11.  He started college but never finished.  He does not routinely exercise but is states while at work he often walks up to 18,000 steps per day.   Family History:  The patient's family history includes Diabetes in his brother, brother, and sister; Diabetes type I in his father; Healthy in his son and son; Heart attack in his father; Heart disease in his mother; Hypertension in his mother.   His mother is alive at age 30.  Father died at 25 and had diabetes and heart problems and was status post pacemaker insertion.  1 brother died at 45 and had suffered a myocardial infarction in his 22s.  He has 3 living brothers in their 70s and 2 sisters ages 21 and 71 both with diabetes.  ROS General: Negative; No fevers, chills, or night sweats;  HEENT: Recent left eye problem seeing a retina specialist.  No changes in hearing, sinus congestion, difficulty swallowing Pulmonary: Negative; No cough, wheezing, shortness of breath,  hemoptysis Cardiovascular: See HPI GI: Negative; No nausea, vomiting, diarrhea, or abdominal pain GU: Negative; No dysuria, hematuria, or difficulty voiding Musculoskeletal: Left shoulder discomfort Hematologic/Oncology: Negative; no easy bruising, bleeding Endocrine: Negative; no heat/cold intolerance; no diabetes Neuro: Negative; no changes in balance, headaches Skin: Negative; No rashes or skin lesions Psychiatric: Negative; No behavioral problems, depression Sleep: Positive for snoring, recently diagnosed moderate sleep apnea overall, severe during supine sleep.  Now on CPAP therapy;  no bruxism, restless legs, hypnogognic hallucinations, no cataplexy Other comprehensive 14 point system review is negative.   PHYSICAL EXAM:   VS:  BP 116/72 (BP Location: Right Arm, Patient Position: Sitting, Cuff Size: Normal)   Pulse (!) 59   Ht 5' 8"  (1.727 m)   Wt 208 lb (94.3 kg)   BMI 31.63 kg/m     Repeat blood pressure by me was 120/70  Wt Readings from Last 3 Encounters:  07/25/22 208 lb (94.3 kg)  04/02/22 215 lb (97.5 kg)  04/02/22 215 lb 12.8 oz (97.9 kg)    General: Alert, oriented, no distress.  Skin: normal turgor, no rashes, warm and dry HEENT: Normocephalic, atraumatic. Pupils equal round and reactive to light; sclera anicteric; extraocular muscles intact;  Nose without nasal septal hypertrophy Mouth/Parynx benign; Mallinpatti scale 3 Neck: No JVD, no carotid bruits; normal carotid upstroke Lungs: clear to ausculatation and percussion; no wheezing or rales Chest wall:  without tenderness to palpitation Heart: PMI not displaced, RRR, s1 s2 normal, 1/6 systolic murmur, no diastolic murmur, no rubs, gallops, thrills, or heaves Abdomen: soft, nontender; no hepatosplenomehaly, BS+; abdominal aorta nontender and not dilated by palpation. Back: no CVA tenderness Pulses 2+ Musculoskeletal: full range of motion, normal strength, no joint deformities Extremities: no clubbing cyanosis  or edema, Homan's sign negative  Neurologic: grossly nonfocal; Cranial nerves grossly wnl Psychologic: Normal mood and affect    Studies/Labs Reviewed:    July 25, 2022 ECG (independently read by me): Sinus bradycardia at 59, LAD, IRBBB  April 02, 2022 ECG (independently read by me): Sinus bradycardia at 56 bpm, left axis deviation.  Incomplete right bundle branch block  October 10, 2021 ECG (independently read by me):Sinus bradycardia at 53; IRBBB, Nonspecific T changes   April 04, 2021 ECG (independently read by me): NSR at 62, IRBBB, PRWP   Recent Labs:    Latest Ref Rng & Units 10/01/2021    9:08 AM 07/12/2021    2:56 PM 06/18/2021    9:02 AM  BMP  Glucose 70 - 99 mg/dL 124  119  121   BUN 6 - 23 mg/dL 16  14  18    Creatinine 0.40 - 1.50 mg/dL 0.83  0.90  0.79   BUN/Creat Ratio 9 - 20  16  23    Sodium 135 - 145 mEq/L 138  139  140   Potassium 3.5 - 5.1 mEq/L 3.9  4.7  4.5   Chloride 96 - 112 mEq/L 102  101  101   CO2 19 - 32 mEq/L 27  22  23    Calcium 8.4 - 10.5 mg/dL 9.1  9.5  9.2         Latest Ref Rng & Units 10/01/2021    9:08 AM 04/25/2021   10:00 PM 04/22/2021    9:28 AM  Hepatic Function  Total Protein 6.0 - 8.3 g/dL 7.1  5.2  6.7   Albumin 3.5 - 5.2 g/dL 4.4  3.3  4.5   AST 0 - 37 U/L 18  30  17    ALT 0 - 53 U/L 17  19  19    Alk Phosphatase 39 - 117 U/L 63  41  73   Total Bilirubin 0.2 - 1.2 mg/dL 0.4  0.8  <0.2        Latest Ref Rng & Units 10/01/2021    9:08 AM 04/27/2021    3:51 AM 04/26/2021    4:59 PM  CBC  WBC 4.0 - 10.5 K/uL 5.2  7.7  12.6   Hemoglobin 13.0 - 17.0 g/dL 13.6  9.5  11.1   Hematocrit 39.0 - 52.0 % 40.4  29.7  33.5   Platelets 150.0 - 400.0 K/uL 195.0  132  158    Lab Results  Component Value Date   MCV 80.5 10/01/2021   MCV 89.5 04/27/2021   MCV 88.2 04/26/2021   Lab Results  Component Value Date   TSH 3.690 04/22/2021   Lab Results  Component Value Date   HGBA1C 6.9 (A) 04/02/2022     BNP No results found for:  "BNP"  ProBNP No results found for: "PROBNP"   Lipid Panel     Component Value Date/Time   CHOL 88 10/01/2021 0908   TRIG 60.0 10/01/2021 0908   HDL 49.30 10/01/2021 0908   CHOLHDL 2 10/01/2021 0908   VLDL 12.0 10/01/2021 0908   LDLCALC 27 10/01/2021 0908   LDLDIRECT 90.0 06/23/2018 1113  RADIOLOGY: No results found.   Additional studies/ records that were reviewed today include:  I reviewed the recent evaluation by Dr. [name].  I also reviewed his [facility] room evaluation in 2019 when he presented with right leg pain and was felt to have sciatica.    Prox RCA lesion is 70% stenosed.   RPAV lesion is 50% stenosed.   Dist RCA lesion is 50% stenosed.   Ost LAD to Prox LAD lesion is 100% stenosed.   Mid LM to Dist LM lesion is 85% stenosed.   Ramus lesion is 80% stenosed.   Ost Cx lesion is 70% stenosed.   Mid Cx to Dist Cx lesion is 50% stenosed.   There is moderate left ventricular systolic dysfunction.   LV end diastolic pressure is normal.   Severe multivessel CAD with 85% on distal left main stenosis; total ostial occlusion of a large LAD; 80% ostial ramus intermediate stenosis and 70% ostial circumflex stenosis.  There is 50% narrowing in the mid left circumflex after marginal branch.  There is significant dampening with each injection into the RCA.  There is 70% proximal RCA stenoses with 50% irregularity distally.  There is extensive collateralization from the distal RCA via septal collaterals supplying the LAD.   RECOMMENDATION: Patient will be admitted with plans for surgical consultation for CABG revascularization surgery due to high-grade left main stenosis.   Intervention   CLINICAL INFORMATION Sleep Study Type: NPSG   Indication for sleep study: N/A   Epworth Sleepiness Score: 7   SLEEP STUDY TECHNIQUE As per the AASM Manual for the Scoring of Sleep and Associated Events v2.3 (April 2016) with a hypopnea requiring 4%  desaturations.   The channels recorded and monitored were frontal, central and occipital EEG, electrooculogram (EOG), submentalis EMG (chin), nasal and oral airflow, thoracic and abdominal wall motion, anterior tibialis EMG, snore microphone, electrocardiogram, and pulse oximetry.   MEDICATIONS furosemide (LASIX) 20 MG tablet acetaminophen (TYLENOL) 325 MG tablet ALPRAZolam (XANAX) 0.5 MG tablet aspirin EC 81 MG tablet azithromycin (ZITHROMAX) 250 MG tablet Blood Glucose Monitoring Suppl (ONE TOUCH ULTRA 2) w/Device KIT dapagliflozin propanediol (FARXIGA) 10 MG TABS tablet glucose blood (ACCU-CHEK AVIVA PLUS) test strip Lancets (ONETOUCH ULTRASOFT) lancets metFORMIN (GLUCOPHAGE) 1000 MG tablet metoprolol tartrate (LOPRESSOR) 25 MG tablet nitroGLYCERIN (NITROSTAT) 0.4 MG SL tablet (Expired) rosuvastatin (CRESTOR) 40 MG tablet Medications self-administered by patient taken the night of the study : N/A   SLEEP ARCHITECTURE The study was initiated at 9:15:29 PM and ended at 5:28:00 AM.   Sleep onset time was 35.2 minutes and the sleep efficiency was 79.2%. The total sleep time was 390 minutes.   Stage REM latency was 144.5 minutes.   The patient spent 5.90% of the night in stage N1 sleep, 75.90% in stage N2 sleep, 1.41% in stage N3 and 16.8% in REM.   Alpha intrusion was absent.   Supine sleep was 28.20%.   RESPIRATORY PARAMETERS The overall apnea/hypopnea index (AHI) was 17.7 per hour. The respiraory disturbance index (RDI) was 17.7/h. There were 65 total apneas, including 2 obstructive, 63 central and 0 mixed apneas. There were 50 hypopneas and 0 RERAs.   The AHI during Stage REM sleep was 1.8 per hour.   AHI while supine was 56.2 per hour.   The mean oxygen saturation was 93.81%. The minimum SpO2 during sleep was 84.00%.   Loud snoring was noted during this study.   CARDIAC DATA The 2 lead EKG demonstrated sinus rhythm. The mean  heart rate was 66.12 beats per minute. Other  EKG findings include: None.   LEG MOVEMENT DATA The total PLMS were 0 with a resulting PLMS index of 0.00. Associated arousal with leg movement index was 0.0 .   IMPRESSIONS - Moderate obstructive sleep apnea overall (AHI 17.7/h; RDI 17.7): however, sleep apnea was severe with supine sleep (AHI 56.2/h). - Mild central sleep apnea occurred during this study (CAI = 9.7/h). - Mild oxygen desaturation to a nadir of 84%. - The patient snored with loud snoring volume. - No cardiac abnormalities were noted during this study. - Clinically significant periodic limb movements did not occur during sleep. No significant associated arousals.   DIAGNOSIS - Obstructive Sleep Apnea (G47.33) - Central Sleep Apnea (G47.37)   RECOMMENDATIONS - In-lab CPAP titration to determine optimal pressure required to alleviate sleep disordered breathing. BiPAP or ASV titration may be required to eliminate central sleep apnea. - Effort should be made to optimize nasal and oropharyngeal patency. - Positional therapy avoiding supine position during sleep. - Avoid alcohol, sedatives and other CNS depressants that may worsen sleep apnea and disrupt normal sleep architecture. - Sleep hygiene should be reviewed to assess factors that may improve sleep quality. - Weight management (BMI 33) and regular exercise should be initiated or continued if appropriate.    ASSESSMENT:    1. Coronary artery disease involving native coronary artery of native heart without angina pectoris   2. OSA (obstructive sleep apnea)   3. S/P CABG x 3   4. Essential hypertension   5. Hyperlipidemia with target LDL less than 55   6. Bradycardia   7. Xanthelasma of right eyelid     PLAN:  Barry Schmidt is a 61 year-old gentleman who has a prior tobacco history and fortunately quit on his birthday November 25, 2017.   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  At  work he has to walk around the store and often carry things from one place to another.   He had developed exertional chest tightness particularly when carrying objects.  His symptoms became fairly intense when he was at [facility] and was carrying a cooler across the infield where he had to stop on several times to catch his breath.  He denied any resting chest wall discomfort.  Cardiac risk factors are notable for prior tobacco use, diabetes mellitus, hyperlipidemia, and family history for CAD.  Definitive cardiac catheterization revealed severe multivessel CAD with high-grade left main as well as ostial disease.  Following day he underwent successful CABG revascularization by Dr. Roxan Hockey and LIMA placed to his LAD, radial artery to OM 2, and SVG to PDA.  Subsequently has had some issues with lower extremity edema which improved with diuretic therapy.  Presently, he feels well from a cardiac standpoint.  He is not having any chest pain or shortness of breath.  Apparently his pulse has been getting low and at times may drop into the mid to upper 40s while sleeping.  He has been on metoprolol tartrate 25 mg twice a day I have suggested he reduce this to 12.5 mg twice a day.  Due to my concerns for obstructive sleep apnea he underwent a sleep evaluation at which confirmed moderate overall sleep apnea which was severe with supine sleep.  Of note, on his diagnostic evaluation he did have central events.  He underwent CPAP titration.  At his last evaluation  on April 02, 2022 he was  meeting compliance standards since his set up date of January 28, 2022.  Currently he has set at a range of 12 to 16 cm with 95th percentile pressure at 13.2 and maximum average pressure 13.8.  His AHI is 6.5 with an apnea index of 6.3 and hypopnea index of 0.2.  He was having central apnea index of 5.5.  During that evaluation I reduced his pressure to a range of 10 to 16 cm of water and due to bradycardia further reduce metoprolol to  12.5 mg twice a day.  Lipid studies in December 2022 were excellent with an LDL of 47.  On his download obtained in the office today he continues to do well with reference to CPAP compliance and is averaging 9 hours and 46 minutes of sleep per night.  His pressure setting is now at 10 to 16 cm of water with his 95th percentile pressure 12.0 with maximum average pressure 13.2.  He continues to have central apnea and Epworth scale despite his significant sleep is increased at 10.  I am recommending he undergo a BiPAP/possible ASV titration in light of his ongoing central events.  He will continue his current regimen for blood pressure control and lipids.  He will be undergoing follow-up laboratory with his primary provider Dr. Rosendo Gros and the.  I will see him in 3 to 4 months for follow-up evaluation.     Medication Adjustments/Labs and Tests Ordered: Current medicines are reviewed at length with the patient today.  Concerns regarding medicines are outlined above.  Medication changes, Labs and Tests ordered today are listed in the Patient Instructions below. Patient Instructions  Medication Instructions:  The current medical regimen is effective;  continue present plan and medications as directed. Please refer to the Current Medication list given to you today.  *If you need a refill on your cardiac medications before your next appointment, please call your pharmacy*   Lab Work: NONE  Testing/Procedures: BIPAP/ASV TITRATION IN THE SLEEP LAB SOMEONE WILL CALL YOU TO SCHEDULE  Follow-Up: At [facility], you and your health needs are our priority.  As part of our continuing mission to provide you with exceptional heart care, we have created designated Provider Care Teams.  These Care Teams include your primary Cardiologist (physician) and Advanced Practice Providers (APPs -  Physician Assistants and Nurse Practitioners) who all work together to provide you with the care you need, when you  need it.  Your next appointment:   3-4 month(s)  The format for your next appointment:   In Person  Provider:   [name], MD    Important Information About Sugar         Signed, [name], MD  08/01/2022 5:00 PM    [facility] [address] [place], [place], [place]  [address] Phone: [phone]

## 2022-08-01 ENCOUNTER — Encounter: Payer: Self-pay | Admitting: Cardiovascular Disease

## 2022-09-03 ENCOUNTER — Ambulatory Visit (HOSPITAL_BASED_OUTPATIENT_CLINIC_OR_DEPARTMENT_OTHER): Payer: 59 | Attending: Cardiovascular Disease | Admitting: Cardiovascular Disease

## 2022-09-03 VITALS — Ht 68.0 in | Wt 209.0 lb

## 2022-09-03 DIAGNOSIS — G4733 Obstructive sleep apnea (adult) (pediatric): Secondary | ICD-10-CM | POA: Insufficient documentation

## 2022-09-03 DIAGNOSIS — G4731 Primary central sleep apnea: Secondary | ICD-10-CM | POA: Diagnosis not present

## 2022-09-14 ENCOUNTER — Encounter (HOSPITAL_BASED_OUTPATIENT_CLINIC_OR_DEPARTMENT_OTHER): Payer: Self-pay | Admitting: Cardiovascular Disease

## 2022-09-14 NOTE — Procedures (Signed)
Patient Name: Barry Schmidt, Barry Schmidt Date: 09/03/2022 Gender: Male D.O.B: December 10, 1960 Age (years): 41 Referring Provider: [name] MD, ABSM Height (inches): 68 Interpreting Physician: [name] MD, ABSM Weight (lbs): 209 RPSGT: Dequavius, Kuhner BMI: 32 MRN: [id number] Neck Size: 15.25  CLINICAL INFORMATION The patient is referred for a BiPAP titration to treat sleep apnea.  Date of NPSG:  08/23/2021: AHI 17.7/h; supine sleep AHI 56.2/h; O2 nadir 84%.  On CPAP titration  study on 01/08/2022  he was titrated to 13 cm of water.  He is referred for BiPAP titration after developing central events with CPAP.  SLEEP STUDY TECHNIQUE As per the AASM Manual for the Scoring of Sleep and Associated Events v2.3 (April 2016) with a hypopnea requiring 4% desaturations.  The channels recorded and monitored were frontal, central and occipital EEG, electrooculogram (EOG), submentalis EMG (chin), nasal and oral airflow, thoracic and abdominal wall motion, anterior tibialis EMG, snore microphone, electrocardiogram, and pulse oximetry. Bilevel positive airway pressure (BPAP) was initiated at the beginning of the study and titrated to treat sleep-disordered breathing.  MEDICATIONS acetaminophen (TYLENOL) 325 MG tablet ALPRAZolam (XANAX) 0.5 MG tablet aspirin EC 81 MG tablet Blood Glucose Monitoring Suppl (ONE TOUCH ULTRA 2) w/Device KIT FARXIGA 10 MG TABS tablet furosemide (LASIX) 20 MG tablet Lancets (ONETOUCH ULTRASOFT) lancets metFORMIN (GLUCOPHAGE) 1000 MG tablet metoprolol tartrate (LOPRESSOR) 25 MG tablet nitroGLYCERIN (NITROSTAT) 0.4 MG SL tablet (Expired) rosuvastatin (CRESTOR) 40 MG tablet sertraline (ZOLOFT) 100 MG tablet Medications self-administered by patient taken the night of the study : N/A  RESPIRATORY PARAMETERS Optimal IPAP Pressure (cm): 11 AHI at Optimal Pressure (/hr) 1.3 Optimal EPAP Pressure (cm): 7   Overall Minimal O2 (%): 82.0 Minimal O2 at Optimal  Pressure (%): 90.0  SLEEP ARCHITECTURE Start Time: 9:28:50 PM Stop Time: 4:53:12 AM Total Time (min): 444.4 Total Sleep Time (min): 405.5 Sleep Latency (min): 13.5 Sleep Efficiency (%): 91.3% REM Latency (min): 173.0 WASO (min): 25.4 Stage N1 (%): 5.7% Stage N2 (%): 77.7% Stage N3 (%): 0.0% Stage R (%): 16.7 Supine (%): 36.28 Arousal Index (/hr): 10.8   CARDIAC DATA The 2 lead EKG demonstrated sinus rhythm. The mean heart rate was 62.5 beats per minute. Other EKG findings include: None.  LEG MOVEMENT DATA The total Periodic Limb Movements of Sleep (PLMS) were 0. The PLMS index was 0.0. A PLMS index of <15 is considered normal in adults.  IMPRESSIONS - BiPAP was initiated at 8/4 and was titrated to 11/7 cm of water. (AHI 1.3/h; O2 nadir 90%. - No significant central sleep apnea was noted during this titration (CAI 0.4/h). - Moderate oxygen desaturations were observed during this titration to a nadir of 82.0% at 8/4 cm. - The patient snored with soft snoring volume. - No cardiac abnormalities were observed during this study. - Clinically significant periodic limb movements were not noted during this study. Arousals associated with PLMs were rare.  DIAGNOSIS - Obstructive Sleep Apnea (G47.33)  RECOMMENDATIONS - Recommend an initial trial of BiPAP therapy on 11/7 cm H2O with heated humidification. A Medium size Fisher&Paykel Full Face Vitera mask was used for the titration. - Effort shouyld be made to optimize nasal and oropharyngeal patency. - Avoid alcohol, sedatives and other CNS depressants that may worsen sleep apnea and disrupt normal sleep architecture. - Sleep hygiene should be reviewed to assess factors that may improve sleep quality. - Weight management and regular exercise should be initiated or continued. - Recommend a download and sleep clinic re-evaluation after 4 weeks  of therapy.  [Electronically signed] 09/14/2022 05:31 PM  [name] MD, [facility], [place],  American Board of Sleep Medicine  NPI: [id number]  [place] PH: [phone]   FX: [phone] [place]

## 2022-09-15 ENCOUNTER — Telehealth: Payer: Self-pay | Admitting: *Deleted

## 2022-09-15 NOTE — Telephone Encounter (Signed)
Patient made aware his sleep study has been completed. He requested for BIPAP order to be sent to [facility] in [place]. Order has been faxed to [facility].

## 2022-09-16 ENCOUNTER — Telehealth: Payer: Self-pay | Admitting: *Deleted

## 2022-09-16 NOTE — Telephone Encounter (Signed)
Call received from [place] with [facility] informing me that they no longer take the patient's insurance. His BIPAP order will need to be sent elsewhere. Patient has been notified and order sent to [facility] via [facility] portal.

## 2022-09-18 ENCOUNTER — Other Ambulatory Visit: Payer: Self-pay | Admitting: Family Medicine

## 2022-09-20 ENCOUNTER — Other Ambulatory Visit: Payer: Self-pay | Admitting: Family Medicine

## 2022-10-01 ENCOUNTER — Ambulatory Visit: Payer: 59 | Admitting: Family Medicine

## 2022-10-16 ENCOUNTER — Other Ambulatory Visit: Payer: Self-pay | Admitting: Cardiovascular Disease

## 2022-10-21 ENCOUNTER — Emergency Department (HOSPITAL_COMMUNITY)
Admission: EM | Admit: 2022-10-21 | Discharge: 2022-10-21 | Discharge status: Against Medical Advice | Payer: 59 | Attending: Emergency Medicine | Admitting: Emergency Medicine

## 2022-10-21 ENCOUNTER — Other Ambulatory Visit: Payer: Self-pay

## 2022-10-21 ENCOUNTER — Telehealth: Payer: Self-pay | Admitting: *Deleted

## 2022-10-21 ENCOUNTER — Telehealth: Payer: Self-pay | Admitting: Family Medicine

## 2022-10-21 DIAGNOSIS — E119 Type 2 diabetes mellitus without complications: Secondary | ICD-10-CM | POA: Insufficient documentation

## 2022-10-21 DIAGNOSIS — R1031 Right lower quadrant pain: Secondary | ICD-10-CM | POA: Insufficient documentation

## 2022-10-21 DIAGNOSIS — Z5321 Procedure and treatment not carried out due to patient leaving prior to being seen by health care provider: Secondary | ICD-10-CM | POA: Insufficient documentation

## 2022-10-21 LAB — CBC WITH DIFFERENTIAL/PLATELET
Abs Immature Granulocytes: 0.02 10*3/uL (ref 0.00–0.07)
Basophils Absolute: 0 10*3/uL (ref 0.0–0.1)
Basophils Relative: 1 %
Eosinophils Absolute: 0.2 10*3/uL (ref 0.0–0.5)
Eosinophils Relative: 3 %
HCT: 41.5 % (ref 39.0–52.0)
Hemoglobin: 13.3 g/dL (ref 13.0–17.0)
Immature Granulocytes: 0 %
Lymphocytes Relative: 28 %
Lymphs Abs: 2 10*3/uL (ref 0.7–4.0)
MCH: 27.8 pg (ref 26.0–34.0)
MCHC: 32 g/dL (ref 30.0–36.0)
MCV: 86.8 fL (ref 80.0–100.0)
Monocytes Absolute: 0.7 10*3/uL (ref 0.1–1.0)
Monocytes Relative: 10 %
Neutro Abs: 4.4 10*3/uL (ref 1.7–7.7)
Neutrophils Relative %: 58 %
Platelets: 193 10*3/uL (ref 150–400)
RBC: 4.78 MIL/uL (ref 4.22–5.81)
RDW: 13.3 % (ref 11.5–15.5)
WBC: 7.3 10*3/uL (ref 4.0–10.5)
nRBC: 0 % (ref 0.0–0.2)

## 2022-10-21 LAB — COMPREHENSIVE METABOLIC PANEL
ALT: 21 U/L (ref 0–44)
AST: 20 U/L (ref 15–41)
Albumin: 4.3 g/dL (ref 3.5–5.0)
Alkaline Phosphatase: 56 U/L (ref 38–126)
Anion gap: 9 (ref 5–15)
BUN: 24 mg/dL — ABNORMAL HIGH (ref 6–20)
CO2: 26 mmol/L (ref 22–32)
Calcium: 9.4 mg/dL (ref 8.9–10.3)
Chloride: 105 mmol/L (ref 98–111)
Creatinine, Ser: 0.88 mg/dL (ref 0.61–1.24)
GFR, Estimated: >60 mL/min (ref >60)
Glucose, Bld: 99 mg/dL (ref 70–99)
Potassium: 4.1 mmol/L (ref 3.5–5.1)
Sodium: 140 mmol/L (ref 135–145)
Total Bilirubin: 0.4 mg/dL (ref 0.3–1.2)
Total Protein: 7 g/dL (ref 6.5–8.1)

## 2022-10-21 LAB — LIPASE, BLOOD: Lipase: 40 U/L (ref 11–51)

## 2022-10-21 NOTE — Telephone Encounter (Signed)
Final disposition: Go to ED Now; patient agreed to comply.   Patient Name: Barry Schmidt Gender: Male DOB: 09-Nov-1960 Age: 62 Y 10 M 28 D Return Phone Number: [phone] (Primary) Address: City/ State/ Zip: [place] [address] Client [facility] Provider [name]- MD Contact Type Call Who Is Calling Patient / Member / Family / Caregiver Call Type Triage / Clinical Relationship To Patient Self Return Phone Number Please choose phone number Chief Complaint Abdominal Pain Reason for Call Symptomatic / Request for [place] states that he has pain in his lower abdomen above the hip. No fever and no vomiting. Pain is 7/10 Translation No Nurse Assessment Nurse: Fredderick Phenix, RN, Lelan Pons Date/Time Eilene Ghazi Time): 10/21/2022 12:32:59 PM Confirm and document reason for call. If symptomatic, describe symptoms. ---Caller states that he has pain in his lower abdomen above the right hip. No fever and no vomiting. Pain is 7/10. Hurts more when walking and moving. Temp 99. Does the patient have any new or worsening symptoms? ---Yes Will a triage be completed? ---Yes Related visit to physician within the last 2 weeks? ---No Does the PT have any chronic conditions? (i.e. diabetes, asthma, this includes High risk factors for pregnancy, etc.) ---Yes List chronic conditions. ---diabetes, cardiac Is this a behavioral health or substance abuse call? ---No Guidelines Guideline Title Affirmed Question Affirmed Notes Nurse Date/Time (Eastern Time) Abdominal Pain - Male [1] SEVERE pain (e.g., excruciating) AND [2] present > 1 hour Stormy Card 10/21/2022 12:34:38 PM PLEASE NOTE: All timestamps contained within this report are represented as [place] Standard Time. CONFIDENTIALTY NOTICE: This fax transmission is intended only for the addressee. It contains  information that is legally privileged, confidential or otherwise protected from use or disclosure. If you are not the intended recipient, you are strictly prohibited from reviewing, disclosing, copying using or disseminating any of this information or taking any action in reliance on or regarding this information. If you have received this fax in error, please notify us immediately by telephone so that we can arrange for its return to [name]. Phone: [phone], Toll-Free: [phone], Fax: [phone] Page: 2 of 2 Call Id: [id number] [place]. Time Eilene Ghazi Time) Disposition Final User 10/21/2022 12:37:31 PM Go to ED Now Yes Fredderick Phenix, RN, Lelan Pons Final Disposition 10/21/2022 12:37:31 PM Go to ED Now Yes Fredderick Phenix, RN, [place] Disagree/Comply Comply Caller Understands Yes PreDisposition Did not know what to do Care Advice Given Per Guideline GO TO ED NOW: * You need to be seen in the Emergency Department. * Leave now. Drive carefully. ANOTHER ADULT SHOULD DRIVE: * It is better and safer if another adult drives instead of you. CARE ADVICE given per Abdominal Pain - Male (Adult) guideline. Referrals GO TO FACILITY UNDECIDED

## 2022-10-21 NOTE — ED Triage Notes (Signed)
C/o shooting right sided abd pain with standing x1 day.  Pt sent by triage nurse from PCP.  Denies n/v/d Denies urinary s/sy Denies testicular pain/swelling

## 2022-10-21 NOTE — ED Provider Triage Note (Signed)
Emergency Medicine Provider Triage Evaluation Note  Barry Schmidt , a 62 y.o. male  was evaluated in triage.  Pt complains of worsening RLQ abdominal pain over the last 24 hours.  Was recommended by PCP come to the ED for evaluation of possible appendicitis.  Pain sharp and worse with movement/ROM.  No known recent injury.  No fevers, chills, or changes in bowel habits or urinary symptoms.  Hx of DMT2.  No Hx of abdominal surgeries.  Review of Systems  Positive:  Negative: See above  Physical Exam  There were no vitals taken for this visit. Gen:   Awake, no distress, appears mildly uncomfortable. Resp:  Normal effort MSK:   Moves extremities without difficulty  Other:  Abdomen soft, tenderness of the RLQ, nondistended.  Medical Decision Making  Medically screening exam initiated at 1:40 PM.  Appropriate orders placed.  Barry Schmidt was informed that the remainder of the evaluation will be completed by another provider, this initial triage assessment does not replace that evaluation, and the importance of remaining in the ED until their evaluation is complete.     Barry Rome, PA-C 35/59/74 [id number]

## 2022-10-21 NOTE — ED Notes (Signed)
Pt stated he was leaving. 

## 2022-10-21 NOTE — Telephone Encounter (Signed)
Patient states; - He began feeling right sided abdominal pain today - States located below belly button, close to waist line  - Pain described as sharp pain when walking; rated 7-8/10 while walking  - Pain resolves once seated   Patient has been transferred to triage.

## 2022-10-21 NOTE — Telephone Encounter (Signed)
Patient called in to say that he would like for [name] to pull a download report because it is showing him a reading of 17 AHI/hr.  I told him that I would be happy to pull a download, however Dr Claiborne Billings is on vacation this week and will not return before next Tuesday. If the report is  abnormal I will send it to Dr Radford Pax for review and recommendations. Otherwise I will show it to Dr Claiborne Billings upon his return. Patient agrees with plan. Download report pulled.and it shows a AHI 8.0 with  6.3 CENTRAL events. AI: 7.4  HI: 0.6  patient does not show any mask leakage. Message will be sent to Dr Radford Pax for review and recommendation.   Dr Radford Pax patient was set up on this BIPAP 19 days ago. Settings are as follows:   Aircurve 10 VAUTO  MODE: SPONT IPAP 11  EPAP 7 EASY-BREATHE - ON

## 2022-10-21 NOTE — Telephone Encounter (Signed)
Patient notified pressure changes were made per Dr Radford Pax. Another download will be obtained in a couple of weeks and shown to Dr Claiborne Billings. Adjustments will be made accordingly if needed.

## 2022-10-24 ENCOUNTER — Telehealth: Payer: Self-pay | Admitting: *Deleted

## 2022-10-24 NOTE — Telephone Encounter (Signed)
Patient called in again this morning to say that his AHI's are going up.  Per Dr Radford Pax previous settings were changed to auto mode IPAP max 15, EPAP min 5 with a pressure support of 4. Download pulled and it now shows AHI 19.9  Central events 18.1, AI 19.6, HI 0.3   Dr Claiborne Billings out of contact until 10/28/22. Message will be sent to Dr Radford Pax for further recommendations.

## 2022-10-29 ENCOUNTER — Encounter: Payer: Self-pay | Admitting: Family Medicine

## 2022-10-29 ENCOUNTER — Ambulatory Visit (INDEPENDENT_AMBULATORY_CARE_PROVIDER_SITE_OTHER): Payer: 59 | Admitting: Family Medicine

## 2022-10-29 VITALS — BP 120/72 | HR 64 | Temp 97.7°F | Resp 16 | Ht 68.0 in | Wt 206.2 lb

## 2022-10-29 DIAGNOSIS — I2510 Atherosclerotic heart disease of native coronary artery without angina pectoris: Secondary | ICD-10-CM

## 2022-10-29 DIAGNOSIS — Z1212 Encounter for screening for malignant neoplasm of rectum: Secondary | ICD-10-CM

## 2022-10-29 DIAGNOSIS — E1169 Type 2 diabetes mellitus with other specified complication: Secondary | ICD-10-CM

## 2022-10-29 DIAGNOSIS — G4733 Obstructive sleep apnea (adult) (pediatric): Secondary | ICD-10-CM | POA: Diagnosis not present

## 2022-10-29 DIAGNOSIS — F4322 Adjustment disorder with anxiety: Secondary | ICD-10-CM

## 2022-10-29 DIAGNOSIS — Z23 Encounter for immunization: Secondary | ICD-10-CM

## 2022-10-29 DIAGNOSIS — E1140 Type 2 diabetes mellitus with diabetic neuropathy, unspecified: Secondary | ICD-10-CM

## 2022-10-29 DIAGNOSIS — G4731 Primary central sleep apnea: Secondary | ICD-10-CM

## 2022-10-29 DIAGNOSIS — E782 Mixed hyperlipidemia: Secondary | ICD-10-CM | POA: Diagnosis not present

## 2022-10-29 DIAGNOSIS — Z1211 Encounter for screening for malignant neoplasm of colon: Secondary | ICD-10-CM

## 2022-10-29 LAB — POCT GLYCOSYLATED HEMOGLOBIN (HGB A1C): Hemoglobin A1C: 7.2 % — AB (ref 4.0–5.6)

## 2022-10-29 LAB — MICROALBUMIN / CREATININE URINE RATIO
Creatinine,U: 98.5 mg/dL
Microalb Creat Ratio: 0.7 mg/g (ref 0.0–30.0)
Microalb, Ur: <0.7 mg/dL (ref 0.0–1.9)

## 2022-10-29 LAB — LIPID PANEL
Cholesterol: 98 mg/dL (ref 0–200)
HDL: 52.2 mg/dL (ref >39.00)
LDL Cholesterol: 34 mg/dL (ref 0–99)
NonHDL: 45.45
Total CHOL/HDL Ratio: 2
Triglycerides: 58 mg/dL (ref 0.0–149.0)
VLDL: 11.6 mg/dL (ref 0.0–40.0)

## 2022-10-29 MED ORDER — METFORMIN HCL 1000 MG PO TABS: 1000.0000 mg | ORAL_TABLET | Freq: Two times a day (BID) | ORAL | 3 refills | Status: DC

## 2022-10-29 MED ORDER — SERTRALINE HCL 100 MG PO TABS: ORAL_TABLET | ORAL | 3 refills | Status: DC

## 2022-10-29 NOTE — Progress Notes (Signed)
Subjective  CC:  Chief Complaint  Patient presents with   Diabetes   ER visit    Seen last week for lower right abdominal pain, was there for 7 hours and then left. Pain is "almost all gone"    Health Maintenance    Needing referral for colonoscopy     HPI: Barry Schmidt is a 62 y.o. male who presents to the office today for follow up of diabetes and problems listed above in the chief complaint.  Diabetes follow up: His diabetic control is reported as Worse. Fastings 120 but post prandials running high: 180 at times. Diet is worse: likes chocolate and processed foods. Weight is stable.  He denies exertional CP or SOB or symptomatic hypoglycemia. He denies foot sores or paresthesias. On farxiga 10 and met 1000 bid. Not on ace; due urine nephropathy screen. CAD: well controlled on bb, no chest pain. On asa. Reviewed cards notes.  Sleep apnea, combined: cards is adjusting settings; had a rough night with 23 ahi episodes. Anxiety: stressful work environment and now worsening due to difficult boss. Feels easily agitated and unhappy. Does fine outside of work. On zoloft 100 daily; has had good response until new boss started. No panic sxs.  Reviewed notes from triage and ED triage. RLQ pain resolved after several days. No blood in urine. More over top of right pelvis. Hurt to move, no pain if sitting or lying. No fevers. Normal cbc. Nl cmp. Normal vitals.   Wt Readings from Last 3 Encounters:  10/29/22 206 lb 3.2 oz (93.5 kg)  10/21/22 207 lb 3.7 oz (94 kg)  09/03/22 209 lb (94.8 kg)    BP Readings from Last 3 Encounters:  10/29/22 120/72  10/21/22 131/70  07/25/22 116/72    Assessment  1. Controlled type 2 diabetes mellitus with neuropathy ([place])   2. Combined hyperlipidemia associated with type 2 diabetes mellitus ([place])   3. Coronary artery disease involving native coronary artery of native heart without angina pectoris   4. OSA (obstructive sleep apnea)   5. Adjustment reaction  with anxiety   6. Screening for colorectal cancer   7. Central sleep apnea      Plan  Diabetes is currently marginally controlled. Will push hard to improve diet. See AVS. Continue farxiga and met. Recheck 3 months. Check urine screen. Can add glp-1 at that time if needed. Education given. Declines flu vaccine HLD due for recheck on rosuvastatin. Nl lfts recently CAD: stable. Asa bb and prn lasix OSA per cards.  Anxiety: counseling done. Titrate up zoloft to max 21m daily to see if can get good control.  RLQ pain: ? Musculoskelatal vs renal vs gi. Hard to know. Resolved. Will monitor.  HM: refer for due colonoscopy and gave tdap.  I spent a total of 40 minutes for this patient encounter. Time spent included preparation, face-to-face counseling with the patient and coordination of care, review of chart and records, and documentation of the encounter.  Follow up: 3 mo for cpe and dm recheck/anxiety recheck. Orders Placed This Encounter  Procedures   Tdap vaccine greater than or equal to 7yo IM   Microalbumin / creatinine urine ratio   Lipid panel   Ambulatory referral to Gastroenterology   POCT HgB A1C   Meds ordered this encounter  Medications   metFORMIN (GLUCOPHAGE) 1000 MG tablet    Sig: Take 1 tablet (1,000 mg total) by mouth 2 (two) times daily with a meal.    Dispense:  180  tablet    Refill:  3   sertraline (ZOLOFT) 100 MG tablet    Sig: Take 1.5 tablets (150 mg total) by mouth daily for 7 days, THEN 2 tablets (200 mg total) daily.    Dispense:  180 tablet    Refill:  3    Increasing from 194m dose      Immunization History  Administered Date(s) Administered   Influenza,inj,Quad PF,6+ Mos 06/24/2018, 06/03/2019, 06/25/2021   Janssen (J&J) SARS-COV-2 Vaccination 02/29/2020   PNEUMOCOCCAL CONJUGATE-20 10/01/2021   Pneumococcal Polysaccharide-23 10/06/2010   Tdap 10/06/2012, 10/29/2022   Zoster Recombinat (Shingrix) 10/01/2021, 04/02/2022    Diabetes Related Lab  Review: Lab Results  Component Value Date   HGBA1C 7.2 (A) 10/29/2022   HGBA1C 6.9 (A) 04/02/2022   HGBA1C 6.7 01/01/2022    Lab Results  Component Value Date   MICROALBUR <0.7 06/25/2021   Lab Results  Component Value Date   CREATININE 0.88 10/21/2022   BUN 24 (H) 10/21/2022   NA 140 10/21/2022   K 4.1 10/21/2022   CL 105 10/21/2022   CO2 26 10/21/2022   Lab Results  Component Value Date   CHOL 88 10/01/2021   CHOL 110 04/02/2021   CHOL 137 12/09/2019   Lab Results  Component Value Date   HDL 49.30 10/01/2021   HDL 51.10 04/02/2021   HDL 61.10 12/09/2019   Lab Results  Component Value Date   LDLCALC 27 10/01/2021   LDLCALC 42 04/02/2021   LDLCALC 62 12/09/2019   Lab Results  Component Value Date   TRIG 60.0 10/01/2021   TRIG 87.0 04/02/2021   TRIG 71.0 12/09/2019   Lab Results  Component Value Date   CHOLHDL 2 10/01/2021   CHOLHDL 2 04/02/2021   CHOLHDL 2 12/09/2019   Lab Results  Component Value Date   LDLDIRECT 90.0 06/23/2018   The ASCVD Risk score (Arnett DK, et al., 2019) failed to calculate for the following reasons:   The valid total cholesterol range is 130 to 320 mg/dL I have reviewed the PMH, Fam and Soc history. Patient Active Problem List   Diagnosis Date Noted   Central sleep apnea 09/03/2022    Priority: High   OSA (obstructive sleep apnea) 09/16/2021    Priority: High    Central and obstructive: sleep study 09/2021; cpap started. May warrant bipap. Avoid supine position    S/P CABG x 3, 04/2021     Priority: High   CAD (coronary artery disease), native coronary artery 04/24/2021    Priority: High   Former smoker 04/02/2021    Priority: High    Quit 11/2019    Controlled type 2 diabetes mellitus with neuropathy ([place] 10/14/2018    Priority: High    Diagnosed in 2007; was morbidly obese at that time 260 pounds. He lost weight and did well.  Negative urine MAC 07/2018; not on ACE. Normotensive    Combined hyperlipidemia  associated with type 2 diabetes mellitus ([place] 10/14/2018    Priority: High   Adjustment reaction with anxiety 10/01/2021    Priority: Medium    Adhesive capsulitis of left shoulder 01/19/2020    Priority: Medium    Femoral neuropathy of right lower extremity 10/14/2018    Priority: Medium    Urticaria 01/01/2022    Priority: Low    Social History: Patient  reports that he quit smoking about 2 years ago. His smoking use included cigarettes. He has never used smokeless tobacco. He reports current alcohol use. He reports that  he does not use drugs.  Review of Systems: Ophthalmic: negative for eye pain, loss of vision or double vision Cardiovascular: negative for chest pain Respiratory: negative for SOB or persistent cough Gastrointestinal: negative for abdominal pain Genitourinary: negative for dysuria or gross hematuria MSK: negative for foot lesions Neurologic: negative for weakness or gait disturbance  Objective  Vitals: BP 120/72 Comment: rechecked by cla, seated left arm  Pulse 64   Temp 97.7 F (36.5 C) (Temporal)   Resp 16   Ht _0  (1.727 m)   Wt 206 lb 3.2 oz (93.5 kg)   SpO2 95%   BMI 31.35 kg/m  General: well appearing, no acute distress  Psych:  Alert and oriented, flat mood and affect, appears tired HEENT:  Normocephalic, atraumatic, moist mucous membranes, supple neck  Cardiovascular:  Nl S1 and S2, RRR without murmur, gallop or rub. no edema Respiratory:  Good breath sounds bilaterally, CTAB with normal effort, no rales Gastrointestinal: normal BS, soft, nontender, no masses   Diabetic education: ongoing education regarding chronic disease management for diabetes was given today. We continue to reinforce the ABC's of diabetic management: A1c (<7 or 8 dependent upon patient), tight blood pressure control, and cholesterol management with goal LDL < 100 minimally. We discuss diet strategies, exercise recommendations, medication options and possible side effects.  At each visit, we review recommended immunizations and preventive care recommendations for diabetics and stress that good diabetic control can prevent other problems. See below for this patient's data.   Commons side effects, risks, benefits, and alternatives for medications and treatment plan prescribed today were discussed, and the patient expressed understanding of the given instructions. Patient is instructed to call or message via MyChart if he/she has any questions or concerns regarding our treatment plan. No barriers to understanding were identified. We discussed Red Flag symptoms and signs in detail. Patient expressed understanding regarding what to do in case of urgent or emergency type symptoms.  Medication list was reconciled, printed and provided to the patient in AVS. Patient instructions and summary information was reviewed with the patient as documented in the AVS. This note was prepared with assistance of Dragon voice recognition software. Occasional wrong-word or sound-a-like substitutions may have occurred due to the inherent limitations of voice recognition software

## 2022-10-29 NOTE — Patient Instructions (Addendum)
Please return in 3 months for your annual complete physical; please come fasting.   Increase the dose of the zoloft as instructed on the bottle.  Eat a strict diabetic diet to get your sugars under control again. We want fasting sugars to be < 120 and 2 hours after meal sugars < 160.  Today you were given your Tdap. This is good for 10years.  We will call you to get you set up with the GI group for colonoscopy consultation.   If you have any questions or concerns, please don't hesitate to send me a message via MyChart or call the office at [phone]. Thank you for visiting with [name] today! It's our pleasure caring for you.   Diabetes Mellitus and Nutrition, Adult When you have diabetes, or diabetes mellitus, it is very important to have healthy eating habits because your blood sugar (glucose) levels are greatly affected by what you eat and drink. Eating healthy foods in the right amounts, at about the same times every day, can help you: Manage your blood glucose. Lower your risk of heart disease. Improve your blood pressure. Reach or maintain a healthy weight. What can affect my meal plan? Every person with diabetes is different, and each person has different needs for a meal plan. Your health care provider may recommend that you work with a dietitian to make a meal plan that is best for you. Your meal plan may vary depending on factors such as: The calories you need. The medicines you take. Your weight. Your blood glucose, blood pressure, and cholesterol levels. Your activity level. Other health conditions you have, such as heart or kidney disease. How do carbohydrates affect me? Carbohydrates, also called carbs, affect your blood glucose level more than any other type of food. Eating carbs raises the amount of glucose in your blood. It is important to know how many carbs you can safely have in each meal. This is different for every person. Your dietitian can help you calculate how many  carbs you should have at each meal and for each snack. How does alcohol affect me? Alcohol can cause a decrease in blood glucose (hypoglycemia), especially if you use insulin or take certain diabetes medicines by mouth. Hypoglycemia can be a life-threatening condition. Symptoms of hypoglycemia, such as sleepiness, dizziness, and confusion, are similar to symptoms of having too much alcohol. Do not drink alcohol if: Your health care provider tells you not to drink. You are pregnant, may be pregnant, or are planning to become pregnant. If you drink alcohol: Limit how much you have to: 0-1 drink a day for women. 0-2 drinks a day for men. Know how much alcohol is in your drink. In the [place] one drink equals one 12 oz bottle of beer (355 mL), one 5 oz glass of wine (148 mL), or one 1 oz glass of hard liquor (44 mL). Keep yourself hydrated with water, diet soda, or unsweetened iced tea. Keep in mind that regular soda, juice, and other mixers may contain a lot of sugar and must be counted as carbs. What are tips for following this plan?  Reading food labels Start by checking the serving size on the Nutrition Facts label of packaged foods and drinks. The number of calories and the amount of carbs, fats, and other nutrients listed on the label are based on one serving of the item. Many items contain more than one serving per package. Check the total grams (g) of carbs in one serving. Check the number  of grams of saturated fats and trans fats in one serving. Choose foods that have a low amount or none of these fats. Check the number of milligrams (mg) of salt (sodium) in one serving. Most people should limit total sodium intake to less than 2,300 mg per day. Always check the nutrition information of foods labeled as "low-fat" or "nonfat." These foods may be higher in added sugar or refined carbs and should be avoided. Talk to your dietitian to identify your daily goals for nutrients listed on the  label. Shopping Avoid buying canned, pre-made, or processed foods. These foods tend to be high in fat, sodium, and added sugar. Shop around the outside edge of the grocery store. This is where you will most often find fresh fruits and vegetables, bulk grains, fresh meats, and fresh dairy products. Cooking Use low-heat cooking methods, such as baking, instead of high-heat cooking methods, such as deep frying. Cook using healthy oils, such as olive, canola, or sunflower oil. Avoid cooking with butter, cream, or high-fat meats. Meal planning Eat meals and snacks regularly, preferably at the same times every day. Avoid going long periods of time without eating. Eat foods that are high in fiber, such as fresh fruits, vegetables, beans, and whole grains. Eat 4-6 oz (112-168 g) of lean protein each day, such as lean meat, chicken, fish, eggs, or tofu. One ounce (oz) (28 g) of lean protein is equal to: 1 oz (28 g) of meat, chicken, or fish. 1 egg.  cup (62 g) of tofu. Eat some foods each day that contain healthy fats, such as avocado, nuts, seeds, and fish. What foods should I eat? Fruits Berries. Apples. Oranges. Peaches. Apricots. Plums. Grapes. Mangoes. Papayas. Pomegranates. Kiwi. Cherries. Vegetables Leafy greens, including lettuce, spinach, kale, chard, collard greens, mustard greens, and cabbage. Beets. Cauliflower. Broccoli. Carrots. Green beans. Tomatoes. Peppers. Onions. Cucumbers. Brussels sprouts. Grains Whole grains, such as whole-wheat or whole-grain bread, crackers, tortillas, cereal, and pasta. Unsweetened oatmeal. Quinoa. Brown or wild rice. Meats and other proteins Seafood. Poultry without skin. Lean cuts of poultry and beef. Tofu. Nuts. Seeds. Dairy Low-fat or fat-free dairy products such as milk, yogurt, and cheese. The items listed above may not be a complete list of foods and beverages you can eat and drink. Contact a dietitian for more information. What foods should I  avoid? Fruits Fruits canned with syrup. Vegetables Canned vegetables. Frozen vegetables with butter or cream sauce. Grains Refined white flour and flour products such as bread, pasta, snack foods, and cereals. Avoid all processed foods. Meats and other proteins Fatty cuts of meat. Poultry with skin. Breaded or fried meats. Processed meat. Avoid saturated fats. Dairy Full-fat yogurt, cheese, or milk. Beverages Sweetened drinks, such as soda or iced tea. The items listed above may not be a complete list of foods and beverages you should avoid. Contact a dietitian for more information. Questions to ask a health care provider Do I need to meet with a certified diabetes care and education specialist? Do I need to meet with a dietitian? What number can I call if I have questions? When are the best times to check my blood glucose? Where to find more information: American Diabetes Association: diabetes.org Academy of Nutrition and Dietetics: eatright.[employer] of Diabetes and Digestive and Kidney Diseases: [employer] Association of Diabetes Care & Education Specialists: diabeteseducator.org Summary It is important to have healthy eating habits because your blood sugar (glucose) levels are greatly affected by what you eat and drink. It  is important to use alcohol carefully. A healthy meal plan will help you manage your blood glucose and lower your risk of heart disease. Your health care provider may recommend that you work with a dietitian to make a meal plan that is best for you. This information is not intended to replace advice given to you by your health care provider. Make sure you discuss any questions you have with your health care provider. Document Revised: 04/25/2020 Document Reviewed: 04/25/2020 Elsevier Patient Education  [place].

## 2022-10-30 NOTE — Progress Notes (Signed)
Please call patient: I have reviewed his/her lab results. Urine test is normal and cholesterol levels are perfect.

## 2022-10-31 ENCOUNTER — Telehealth: Payer: Self-pay | Admitting: *Deleted

## 2022-10-31 NOTE — Telephone Encounter (Signed)
Patient was notified that Dr Claiborne Billings has reviewed  dated 10/23/22 to 10/30/22 and it continues to show him having a high number of central events. He had me to increase the pressure support to 8. Recommends that we revisit a download in approximately one week. If there are still a high number of central events he will need to have a ASV itration. Patient voiced understanding of the plan.

## 2022-11-04 ENCOUNTER — Telehealth: Payer: Self-pay | Admitting: *Deleted

## 2022-11-04 NOTE — Telephone Encounter (Signed)
Received a call from [place], RT with [facility]. The patient's wife called in to see if they could check to see if there is a mechanical issue with his BIPAP machine that is causing his events to go up. After checking the device Glenda states the machine is working properly. She recommends changing his pressure settings back to spontaneous mode with EPAP 11 and IPAP 15. Message will be sent to Dr Claiborne Billings for review and further recommendations.

## 2022-11-04 NOTE — Telephone Encounter (Signed)
Patient called to say that his "episodes are still going up." Download pulled and it shows AHI 22.8   Central 20.7  AI 22.5  HI 0.3. appointment scheduled to see Dr Claiborne Billings on Friday 11/07/22. Download will be shown to Dr Claiborne Billings on tomorrow when he returns to the office.

## 2022-11-07 ENCOUNTER — Encounter: Payer: Self-pay | Admitting: Cardiovascular Disease

## 2022-11-07 ENCOUNTER — Ambulatory Visit: Payer: 59 | Admitting: Cardiovascular Disease

## 2022-11-07 ENCOUNTER — Ambulatory Visit: Payer: 59 | Attending: Cardiovascular Disease | Admitting: Cardiovascular Disease

## 2022-11-07 VITALS — BP 118/70 | HR 64 | Ht 68.0 in | Wt 207.0 lb

## 2022-11-07 DIAGNOSIS — G4731 Primary central sleep apnea: Secondary | ICD-10-CM

## 2022-11-07 DIAGNOSIS — I2510 Atherosclerotic heart disease of native coronary artery without angina pectoris: Secondary | ICD-10-CM

## 2022-11-07 DIAGNOSIS — G4733 Obstructive sleep apnea (adult) (pediatric): Secondary | ICD-10-CM | POA: Diagnosis not present

## 2022-11-07 DIAGNOSIS — E785 Hyperlipidemia, unspecified: Secondary | ICD-10-CM

## 2022-11-07 DIAGNOSIS — Z951 Presence of aortocoronary bypass graft: Secondary | ICD-10-CM

## 2022-11-07 DIAGNOSIS — R001 Bradycardia, unspecified: Secondary | ICD-10-CM

## 2022-11-07 DIAGNOSIS — R0602 Shortness of breath: Secondary | ICD-10-CM

## 2022-11-07 MED ORDER — METOPROLOL TARTRATE 25 MG PO TABS: 12.5000 mg | ORAL_TABLET | Freq: Every morning | ORAL | 3 refills | Status: DC

## 2022-11-07 NOTE — Patient Instructions (Signed)
Medication Instructions:  DECREASE metoprolol tartrate (Lopressor) to 12.5 mg in the morning  *If you need a refill on your cardiac medications before your next appointment, please call your pharmacy*   Lab Work: CMET, BNP today  If you have labs (blood work) drawn today and your tests are completely normal, you will receive your results only by: [place] (if you have MyChart) OR A paper copy in the mail If you have any lab test that is abnormal or we need to change your treatment, we will call you to review the results.   Testing/Procedures: ASV titration  Your physician has requested that you have an echocardiogram. Echocardiography is a painless test that uses sound waves to create images of your heart. It provides your doctor with information about the size and shape of your heart and how well your heart's chambers and valves are working. This procedure takes approximately one hour. There are no restrictions for this procedure. Please do NOT wear cologne, perfume, aftershave, or lotions (deodorant is allowed). Please arrive 15 minutes prior to your appointment time.    Follow-Up: At [facility], you and your health needs are our priority.  As part of our continuing mission to provide you with exceptional heart care, we have created designated Provider Care Teams.  These Care Teams include your primary Cardiologist (physician) and Advanced Practice Providers (APPs -  Physician Assistants and Nurse Practitioners) who all work together to provide you with the care you need, when you need it.  We recommend signing up for the patient portal called "MyChart".  Sign up information is provided on this After Visit Summary.  MyChart is used to connect with patients for Virtual Visits (Telemedicine).  Patients are able to view lab/test results, encounter notes, upcoming appointments, etc.  Non-urgent messages can be sent to your provider as well.   To learn more about what you  can do with MyChart, go to [email or web address].    Your next appointment:   F/u after you receive your new machine

## 2022-11-07 NOTE — Progress Notes (Signed)
Cardiology Office Note    Date:  11/07/2022   ID:  Barry Schmidt, DOB 1961/02/16, MRN [id number]  PCP:  Willow OraAndy, Camille L, MD  Cardiologist:  Nicki Guadalajarahomas Jehieli Brassell, MD   2 month follow-up cardiology and sleep evaluation initially referred through the courtesy of Dr. Asencion Partridgeamille Andy for evaluation of chest tightness   History of Present Illness:  Barry Schmidt is a 62 y.o. male who is originally from [place].  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  He had  developed chest tightness particularly while lifting objects and is referred by Dr. Mardelle MatteAndy for cardiology consultation.  I saw him for my initial evaluation on April 04, 2021.   Barry Schmidt has remote tobacco history and had smoked off and on for 12 years but quit in February 2019.  He has a history of diabetes mellitus for at least 10 years.  He states that he had had episodes of vertigo and staggering shortly following being vaccinated for COVID.  He underwent MRI of his head.  He states that 1 year ago he started to notice more shortness of breath when wearing a mask.  Recently, he has experienced progressive shortness of breath with activity and chest tightness when he is walking and carrying things.  He recently attended [employer] and developed significant chest tightness while walking on his infield carrying a cooler.  He describes a tightness as a pressure at the weight sitting on his chest.  His symptoms dissipate with rest.  He was evaluated by Dr. Mardelle MatteAndy on April 02, 2021 and due to concern for angina pectoris cardiology consultation was requested.   He has recently started to see a retina specialist for his left eye.  He has a history of hyperlipidemia and was recently started on rosuvastatin.  He has been on Farxiga and metformin for his diabetes mellitus.  He also has a history for neuropathy.  During his initial evaluation with me, I was concerned with his symptoms most  likely representing progressive angina pectoris.  We discussed multiple scenarios of evaluation and after much discussion definitive cardiac catheterization was recommended.  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  There was 50% narrowing in the mid circumflex vessel after marginal branch.  He had significant dampening with each injection into the RCA and had 70% proximal stenosis with distal irregularity.  There was extensive collateralization from the distal RCA via septal collaterals supplying the LAD.  Due to his high-grade complex anatomy CABG revascularization was recommended.  He underwent successful CABG revascularization on April 25, 2020 by Dr. Dorris FetchHendrickson with LIMA to the LAD, radial artery to OM 2, and SVG to PDA.  He was discharged on May 01, 2021.  Subsequently, he has been evaluated by Edd FabianJesse Cleaver, NP  and Joni ReiningKathryn Lawrence, NP in our office.  He had issues with lower extremity edema.  He has had issues with anxiety and stress and panic attacks.   Due to concerns for sleep apnea he was referred for a sleep study which was done at [facility] from August 23, 2021.  He was found to have moderate overall sleep apnea with AHI of 17.7; no events were severe with supine sleep AHI 56.2/h.  There was loud  snoring.  Following his diagnostic study he also was noted to have central apneic events with a central apnea index of 9.7.  As result, in lab CPAP titration was recommended with potential need for BiPAP or ASV titration if necessary depending upon LV function.    When I saw him on October 10, 2021 he denied any chest pain.  He denies any palpitations.  He is back and working Designer, multimedia.  He has had issues with his left shoulder when he lifts his left arm.  He does  notice occasional left lower extremity edema.  He is now on furosemide 20 mg on an as-needed basis, Toprol tartrate 25 mg twice a day.  He is on [place] and metformin for his diabetes.  He is on sertraline and alprazolam for anxiety.   I saw him on April 02, 2022.  He underwent a CPAP titration study on January 08, 2022.  He was titrated to 13 cm of water where AHI was 0 and O2 nadir 95%.  He was recommended that he initiate a trial of CPAP auto therapy with EPR of 3 at 12 to 16 cm.  He received a new ResMed AirSense 11 AutoSet unit on January 28, 2022.  A download was obtained in the office today from April 25 through Feb 26, 2022 which showed excellent compliance with 100% use and average use at 9 hours and 28 minutes.  At his 12 to 16 cm pressure range AHI was 6.5 and a central apnea index was 5.5.  His 95th percentile pressure was 13.2.  Presently, he has felt well without recurrent chest pain.  He notes his heart rate decreases and can drop into the 40s when he is sleeping.  He currently is taking metformin 1000 mg twice a day, Farxiga 10 mg, furosemide as needed 20 mg, metoprolol tartrate 25 mg twice a day.  He is on rosuvastatin 40 mg daily and aspirin.  He continues to take Zoloft.  During that evaluation, he was bradycardic and metoprolol dose was slightly reduced.  I was concerned about a central apnea index of 5.5 and recommended he decrease his start pressure and to have a range of 10 to 16 cm of water.  I discussed potential future need for possible BiPAP or ASV titration depending upon subsequent central events.  I last saw him on July 25, 2022.  Since his prior evaluation he was continuingd to use CPAP at his pressure range of 10 to 16 cm of water.  His 95th percentile pressure is 12.0 with maximum average pressure 13.2.  AHI is 8.1 but his central apnea index is 7.0 with obstructive apnea index at 0.4.  He denied any chest tightness or pressure but occasionally experiences some chest tingling which  is nonexertional.  He continues to be on a medical regimen of Farxiga 10 mg and metformin 1000 mg for his diabetes mellitus.  He is now on metoprolol tartrate 12.5 mg twice a day and continues to be on rosuvastatin 40 mg daily for hyper lipidemia.  He is on sertraline.  An Epworth Sleepiness Scale score was recalculated in the office today and this endorsed at 10 suggestive of borderline residual daytime sleepiness.    Due to continued central events on subsequent downloads, he was referred for a BiPAP titration study which was done on September 03, 2022.  BiPAP was initiated 8/4 and was titrated to 11 over 7 cm of water.  He subsequently received a BiPAP machine on October 02, 2022.  Initially was on a spontaneous mode with his download showing significant central events he was changed by Dr. Mayford Knife to an auto mode when I was away for the week on vacation.  Patient will downloads with the auto mode has continued to show significant central events despite adjustment of his pressure support.  Most recent downloads have demonstrated central events of 16.2 and 20.3 with his 95th percentile pressure 13.8/7.3.  Presents to the office today for follow-up evaluation.  He has been trying to sleep at least 9 hours per night.  He denies any increasing shortness of breath or awareness of lower extremity edema or CHF symptoms.  He continues to be on Farxiga 10 mg and metformin for his diabetes.  He has been taking metoprolol to tartrate 12.5 mg twice a day.  He is on rosuvastatin 40 mg at bedtime and takes a baby aspirin.  He has not been taking furosemide 20 mg but only takes this when needed typically may be every third week.  He presents for evaluation.   Past Medical History:  Diagnosis Date   Diabetes mellitus without complication (HCC)    On statin therapy due to risk of future cardiovascular event 10/14/2018   OSA (obstructive sleep apnea) 09/16/2021   Central and obstructive: sleep study 09/2021; cpap started.  May warrant bipap. Avoid supine position   S/P CABG x 3, 04/2021    Uncontrolled diabetes mellitus without complication, with long-term current use of insulin 06/23/2018   Diagnosed in 2007; was morbidly obese at that time 260 pounds. He lost weight and did well.      Past Surgical History:  Procedure Laterality Date   CORONARY ARTERY BYPASS GRAFT N/A 04/25/2021   Procedure: CORONARY ARTERY BYPASS GRAFTING (CABG)x 3 ON CARDIOPULMONARY BYPASS USING LIMA, LEFT RADIAL ARTERY AND  GSV.;  Surgeon: Loreli Slot, MD;  Location: [facility] OR;  Service: Open Heart Surgery;  Laterality: N/A;   ENDOVEIN HARVEST OF GREATER SAPHENOUS VEIN Bilateral 04/25/2021   Procedure: ENDOVEIN HARVEST OF GREATER SAPHENOUS VEIN;  Surgeon: Loreli Slot, MD;  Location: [facility] OR;  Service: Open Heart Surgery;  Laterality: Bilateral;   LEFT HEART CATH AND CORONARY ANGIOGRAPHY N/A 04/24/2021   Procedure: LEFT HEART CATH AND CORONARY ANGIOGRAPHY;  Surgeon: Lennette Bihari, MD;  Location: [facility] INVASIVE CV LAB;  Service: Cardiovascular;  Laterality: N/A;   RADIAL ARTERY HARVEST Left 04/25/2021   Procedure: RADIAL ARTERY HARVEST;  Surgeon: Loreli Slot, MD;  Location: [facility] OR;  Service: Open Heart Surgery;  Laterality: Left;   TEE WITHOUT CARDIOVERSION N/A 04/25/2021   Procedure: TRANSESOPHAGEAL ECHOCARDIOGRAM (TEE);  Surgeon: Loreli Slot, MD;  Location: [facility] OR;  Service: Open Heart Surgery;  Laterality: N/A;   TONSILECTOMY/ADENOIDECTOMY WITH MYRINGOTOMY  2004   Uvilaectomy.    Current Medications: Outpatient Medications Prior to Visit  Medication Sig Dispense Refill   ACCU-CHEK AVIVA PLUS test strip USE AS INSTRUCTED TO TEST BLOOD GLUCOSE 100 strip 12   acetaminophen (TYLENOL) 325 MG tablet Take 2 tablets (650 mg total) by mouth every 6 (six) hours as needed for mild pain.     ALPRAZolam (XANAX) 0.5 MG tablet Take 1 tablet (0.5 mg total) by mouth daily as needed for anxiety. 20 tablet 1   aspirin EC 81  MG tablet Take 81 mg by mouth at bedtime.     Blood Glucose Monitoring Suppl (ONE TOUCH ULTRA 2) w/Device KIT 1 kit by Does not apply route 2 (two) times daily. 1 each  0   dapagliflozin propanediol (FARXIGA) 10 MG TABS tablet Take 1 tablet (10 mg total) by mouth daily. 90 tablet 3   furosemide (LASIX) 20 MG tablet TAKE 1 TABLET BY MOUTH EVERY DAY AS NEEDED 90 tablet 1   Lancets (ONETOUCH ULTRASOFT) lancets Use as instructed 100 each 12   metFORMIN (GLUCOPHAGE) 1000 MG tablet Take 1 tablet (1,000 mg total) by mouth 2 (two) times daily with a meal. 180 tablet 3   nitroGLYCERIN (NITROSTAT) 0.4 MG SL tablet Place 1 tablet (0.4 mg total) under the tongue every 5 (five) minutes as needed for chest pain. 25 tablet 6   rosuvastatin (CRESTOR) 40 MG tablet TAKE 1 TABLET BY MOUTH EVERYDAY AT BEDTIME 90 tablet 3   sertraline (ZOLOFT) 100 MG tablet Take 1.5 tablets (150 mg total) by mouth daily for 7 days, THEN 2 tablets (200 mg total) daily. 180 tablet 3   metoprolol tartrate (LOPRESSOR) 25 MG tablet TAKE 1 TABLET BY MOUTH TWICE A DAY (Patient taking differently: Take 25 mg by mouth daily. Taking 1/2 tablet in morning 1/2 in evening) 180 tablet 6   No facility-administered medications prior to visit.     Allergies:   Penicillins   Social History   Socioeconomic History   Marital status: Married    Spouse name: [name]   Number of children: 2   Years of education: Not on file   Highest education level: Not on file  Occupational History   Occupation: Freight forwarder, Academic librarian: [employer]  Tobacco Use   Smoking status: Former    Types: Cigarettes    Quit date: 11/07/2019    Years since quitting: 3.0   Smokeless tobacco: Never  Vaping Use   Vaping Use: Never used  Substance and Sexual Activity   Alcohol use: Yes    Comment: Occastional    Drug use: Never   Sexual activity: Yes    Partners: Female  Other Topics Concern   Not on file  Social History Narrative   Right handed    Caffeine 4 daily     Lives at home with wife ( [name]) and child.   Social Determinants of Health   Financial Resource Strain: Not on file  Food Insecurity: Not on file  Transportation Needs: Not on file  Physical Activity: Not on file  Stress: Not on file  Social Connections: Not on file    Socially he was born in [place].  He is in his second marriage of 27 years..  His first marriage lasted 43 years.  He has 2 children ages 52 and 74 and 2 grandchildren ages 20 and 80.  He started college but never finished.  He does not routinely exercise but is states while at work he often walks up to 18,000 steps per day.   Family History:  The patient's family history includes Diabetes in his brother, brother, and sister; Diabetes type I in his father; Healthy in his son and son; Heart attack in his father; Heart disease in his mother; Hypertension in his mother.   His mother is alive at age 38.  Father died at 33 and had diabetes and heart problems and was status post pacemaker insertion.  1 brother died at 15 and had suffered a myocardial infarction in his 65s.  He has 3 living brothers in their 47s and 2 sisters ages 64 and 56 both with diabetes.  ROS General: Negative; No fevers, chills, or night sweats;  HEENT: Recent left eye problem  seeing a retina specialist.  No changes in hearing, sinus congestion, difficulty swallowing Pulmonary: Negative; No cough, wheezing, shortness of breath, hemoptysis Cardiovascular: See HPI GI: Negative; No nausea, vomiting, diarrhea, or abdominal pain GU: Negative; No dysuria, hematuria, or difficulty voiding Musculoskeletal: Left shoulder discomfort Hematologic/Oncology: Negative; no easy bruising, bleeding Endocrine: Negative; no heat/cold intolerance; no diabetes Neuro: Negative; no changes in balance, headaches Skin: Negative; No rashes or skin lesions Psychiatric: Negative; No behavioral problems, depression Sleep: Positive for snoring, recently diagnosed  moderate sleep apnea overall, severe during supine sleep.  Was on CPAP and now BiPAP therapy with continued central events Other comprehensive 14 point system review is negative.   PHYSICAL EXAM:   VS:  BP 118/70   Pulse 64   Ht 5\' 8"  (1.727 m)   Wt 207 lb (93.9 kg)   SpO2 94%   BMI 31.47 kg/m     Repeat blood pressure by me was 102/66 supine and 100/60 standing  Wt Readings from Last 3 Encounters:  11/07/22 207 lb (93.9 kg)  10/29/22 206 lb 3.2 oz (93.5 kg)  10/21/22 207 lb 3.7 oz (94 kg)   General: Alert, oriented, no distress.  Skin: normal turgor, no rashes, warm and dry HEENT: Normocephalic, atraumatic. Pupils equal round and reactive to light; sclera anicteric; extraocular muscles intact;  Nose without nasal septal hypertrophy Mouth/Parynx benign; Mallinpatti scale Neck: No JVD, no carotid bruits; normal carotid upstroke Lungs: clear to ausculatation and percussion; no wheezing or rales Chest wall: without tenderness to palpitation Heart: PMI not displaced, RRR, s1 s2 normal, 1/6 systolic murmur, no diastolic murmur, no rubs, gallops, thrills, or heaves Abdomen: soft, nontender; no hepatosplenomehaly, BS+; abdominal aorta nontender and not dilated by palpation. Back: no CVA tenderness Pulses 2+ Musculoskeletal: full range of motion, normal strength, no joint deformities Extremities: no clubbing cyanosis or edema, Homan's sign negative  Neurologic: grossly nonfocal; Cranial nerves grossly wnl Psychologic: Normal mood and affect     Studies/Labs Reviewed:    July 25, 2022 ECG (independently read by me): Sinus bradycardia at 59, LAD, IRBBB  April 02, 2022 ECG (independently read by me): Sinus bradycardia at 56 bpm, left axis deviation.  Incomplete right bundle branch block  October 10, 2021 ECG (independently read by me):Sinus bradycardia at 53; IRBBB, Nonspecific T changes   April 04, 2021 ECG (independently read by me): NSR at 62, IRBBB, PRWP   Recent Labs:     Latest Ref Rng & Units 10/21/2022    1:40 PM 10/01/2021    9:08 AM 07/12/2021    2:56 PM  BMP  Glucose 70 - 99 mg/dL 99  124  119   BUN 6 - 20 mg/dL 24  16  14    Creatinine 0.61 - 1.24 mg/dL 0.88  0.83  0.90   BUN/Creat Ratio 9 - 20   16   Sodium 135 - 145 mmol/L 140  138  139   Potassium 3.5 - 5.1 mmol/L 4.1  3.9  4.7   Chloride 98 - 111 mmol/L 105  102  101   CO2 22 - 32 mmol/L 26  27  22    Calcium 8.9 - 10.3 mg/dL 9.4  9.1  9.5         Latest Ref Rng & Units 10/21/2022    1:40 PM 10/01/2021    9:08 AM 04/25/2021   10:00 PM  Hepatic Function  Total Protein 6.5 - 8.1 g/dL 7.0  7.1  5.2   Albumin 3.5 - 5.0 g/dL  4.3  4.4  3.3   AST 15 - 41 U/L 20  18  30    ALT 0 - 44 U/L 21  17  19    Alk Phosphatase 38 - 126 U/L 56  63  41   Total Bilirubin 0.3 - 1.2 mg/dL 0.4  0.4  0.8        Latest Ref Rng & Units 10/21/2022    1:40 PM 10/01/2021    9:08 AM 04/27/2021    3:51 AM  CBC  WBC 4.0 - 10.5 K/uL 7.3  5.2  7.7   Hemoglobin 13.0 - 17.0 g/dL 10/03/2021  04/29/2021  9.5   Hematocrit 39.0 - 52.0 % 41.5  40.4  29.7   Platelets 150 - 400 K/uL 193  195.0  132    Lab Results  Component Value Date   MCV 86.8 10/21/2022   MCV 80.5 10/01/2021   MCV 89.5 04/27/2021   Lab Results  Component Value Date   TSH 3.690 04/22/2021   Lab Results  Component Value Date   HGBA1C 7.2 (A) 10/29/2022     BNP No results found for: "BNP"  ProBNP No results found for: "PROBNP"   Lipid Panel     Component Value Date/Time   CHOL 98 10/29/2022 0940   TRIG 58.0 10/29/2022 0940   HDL 52.20 10/29/2022 0940   CHOLHDL 2 10/29/2022 0940   VLDL 11.6 10/29/2022 0940   LDLCALC 34 10/29/2022 0940   LDLDIRECT 90.0 06/23/2018 1113     RADIOLOGY: No results found.   Additional studies/ records that were reviewed today include:  I reviewed the recent evaluation by Dr. 10/31/2022.  I also reviewed his 06/25/2018 emergency room evaluation in 2019 when he presented with right leg pain and was felt to have  sciatica.    Prox RCA lesion is 70% stenosed.   RPAV lesion is 50% stenosed.   Dist RCA lesion is 50% stenosed.   Ost LAD to Prox LAD lesion is 100% stenosed.   Mid LM to Dist LM lesion is 85% stenosed.   Ramus lesion is 80% stenosed.   Ost Cx lesion is 70% stenosed.   Mid Cx to Dist Cx lesion is 50% stenosed.   There is moderate left ventricular systolic dysfunction.   LV end diastolic pressure is normal.   Severe multivessel CAD with 85% on distal left main stenosis; total ostial occlusion of a large LAD; 80% ostial ramus intermediate stenosis and 70% ostial circumflex stenosis.  There is 50% narrowing in the mid left circumflex after marginal branch.  There is significant dampening with each injection into the RCA.  There is 70% proximal RCA stenoses with 50% irregularity distally.  There is extensive collateralization from the distal RCA via septal collaterals supplying the LAD.   RECOMMENDATION: Patient will be admitted with plans for surgical consultation for CABG revascularization surgery due to high-grade left main stenosis.   Intervention   CLINICAL INFORMATION Sleep Study Type: NPSG   Indication for sleep study: N/A   Epworth Sleepiness Score: 7   SLEEP STUDY TECHNIQUE As per the AASM Manual for the Scoring of Sleep and Associated Events v2.3 (April 2016) with a hypopnea requiring 4% desaturations.   The channels recorded and monitored were frontal, central and occipital EEG, electrooculogram (EOG), submentalis EMG (chin), nasal and oral airflow, thoracic and abdominal wall motion, anterior tibialis EMG, snore microphone, electrocardiogram, and pulse oximetry.   MEDICATIONS furosemide (LASIX) 20 MG tablet acetaminophen (TYLENOL) 325 MG tablet ALPRAZolam (  XANAX) 0.5 MG tablet aspirin EC 81 MG tablet azithromycin (ZITHROMAX) 250 MG tablet Blood Glucose Monitoring Suppl (ONE TOUCH ULTRA 2) w/Device KIT dapagliflozin propanediol (FARXIGA) 10 MG TABS tablet glucose blood  (ACCU-CHEK AVIVA PLUS) test strip Lancets (ONETOUCH ULTRASOFT) lancets metFORMIN (GLUCOPHAGE) 1000 MG tablet metoprolol tartrate (LOPRESSOR) 25 MG tablet nitroGLYCERIN (NITROSTAT) 0.4 MG SL tablet (Expired) rosuvastatin (CRESTOR) 40 MG tablet Medications self-administered by patient taken the night of the study : N/A   SLEEP ARCHITECTURE The study was initiated at 9:15:29 PM and ended at 5:28:00 AM.   Sleep onset time was 35.2 minutes and the sleep efficiency was 79.2%. The total sleep time was 390 minutes.   Stage REM latency was 144.5 minutes.   The patient spent 5.90% of the night in stage N1 sleep, 75.90% in stage N2 sleep, 1.41% in stage N3 and 16.8% in REM.   Alpha intrusion was absent.   Supine sleep was 28.20%.   RESPIRATORY PARAMETERS The overall apnea/hypopnea index (AHI) was 17.7 per hour. The respiraory disturbance index (RDI) was 17.7/h. There were 65 total apneas, including 2 obstructive, 63 central and 0 mixed apneas. There were 50 hypopneas and 0 RERAs.   The AHI during Stage REM sleep was 1.8 per hour.   AHI while supine was 56.2 per hour.   The mean oxygen saturation was 93.81%. The minimum SpO2 during sleep was 84.00%.   Loud snoring was noted during this study.   CARDIAC DATA The 2 lead EKG demonstrated sinus rhythm. The mean heart rate was 66.12 beats per minute. Other EKG findings include: None.   LEG MOVEMENT DATA The total PLMS were 0 with a resulting PLMS index of 0.00. Associated arousal with leg movement index was 0.0 .   IMPRESSIONS - Moderate obstructive sleep apnea overall (AHI 17.7/h; RDI 17.7): however, sleep apnea was severe with supine sleep (AHI 56.2/h). - Mild central sleep apnea occurred during this study (CAI = 9.7/h). - Mild oxygen desaturation to a nadir of 84%. - The patient snored with loud snoring volume. - No cardiac abnormalities were noted during this study. - Clinically significant periodic limb movements did not occur during  sleep. No significant associated arousals.   DIAGNOSIS - Obstructive Sleep Apnea (G47.33) - Central Sleep Apnea (G47.37)   RECOMMENDATIONS - In-lab CPAP titration to determine optimal pressure required to alleviate sleep disordered breathing. BiPAP or ASV titration may be required to eliminate central sleep apnea. - Effort should be made to optimize nasal and oropharyngeal patency. - Positional therapy avoiding supine position during sleep. - Avoid alcohol, sedatives and other CNS depressants that may worsen sleep apnea and disrupt normal sleep architecture. - Sleep hygiene should be reviewed to assess factors that may improve sleep quality. - Weight management (BMI 33) and regular exercise should be initiated or continued if appropriate.    ASSESSMENT:    1. Coronary artery disease involving native coronary artery of native heart without angina pectoris   2. S/P CABG x 3   3. OSA (obstructive sleep apnea)   4. Central sleep apnea   5. Hyperlipidemia with target LDL less than 55   6. Shortness of breath   7. Bradycardia     PLAN:  Barry Schmidt is a 62 year-old gentleman who has a prior tobacco history and fortunately quit on his birthday November 25, 2017.   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  At work  he has to walk around the store and often carry things from one place to another.   He had developed exertional chest tightness particularly when carrying objects.  His symptoms became fairly intense when he was at [facility] and was carrying a cooler across the infield where he had to stop on several times to catch his breath.  He denied any resting chest wall discomfort.  Cardiac risk factors are notable for prior tobacco use, diabetes mellitus, hyperlipidemia, and family history for CAD.  Definitive cardiac catheterization revealed severe multivessel CAD with high-grade left main as well as  ostial disease.  Following day he underwent successful CABG revascularization by Dr. Roxan Hockey and LIMA placed to his LAD, radial artery to OM 2, and SVG to PDA.  Subsequently has had some issues with lower extremity edema which improved with diuretic therapy.  Presently, he feels well from a cardiac standpoint.  He is not having any chest pain or shortness of breath.  Apparently his pulse has been getting low and at times may drop into the mid to upper 40s while sleeping.  He has been on metoprolol tartrate 25 mg twice a day I have suggested he reduce this to 12.5 mg twice a day.  Due to my concerns for obstructive sleep apnea he underwent a sleep evaluation at which confirmed moderate overall sleep apnea which was severe with supine sleep.  Of note, on his diagnostic evaluation he did have central events.  He underwent CPAP titration.  At his last evaluation on April 02, 2022 he was  meeting compliance standards since his set up date of January 28, 2022.  He was set at a range of 12 to 16 cm with 95th percentile pressure at 13.2 and maximum average pressure 13.8.  His AHI is 6.5 with an apnea index of 6.3 and hypopnea index of 0.2.  He was having central apnea index of 5.5.  During that evaluation I reduced his pressure to a range of 10 to 16 cm of water and due to bradycardia further reduce metoprolol to 12.5 mg twice a day.  Lipid studies in December 2022 were excellent with an LDL of 47.  On serial downloads with CPAP he continued to have significant central events.  He subsequently underwent BiPAP titration.  Since receiving BiPAP therapy he has continued to have central events and at times these have increased significantly to 20.3/h as well as 14 events per hour.  Presently, he appears euvolemic on exam.  I am recommending a repeat echo Doppler study he had normal LV function on TEE during his CABG revascularization.  If LV function remains stable he will undergo ASV titration.  I recommended he change his  metoprolol to just 12.5 mg in the morning and not take the evening dose  due to his low blood pressure.  Continues to be on rosuvastatin 40 mg daily with most recent LDL on October 29, 2022 excellent now at 63.  Serum creatinine was 0.88.  I am checking a comprehensive metabolic panel and will also check a brain natruretic peptide today to assess volume status.  We will schedule him for ASV titration and I will see him in follow-up and further recommendations were made at that time.   Medication Adjustments/Labs and Tests Ordered: Current medicines are reviewed at length with the patient today.  Concerns regarding medicines are outlined above.  Medication changes, Labs and Tests ordered today are listed in the Patient Instructions below. Patient Instructions  Medication Instructions:  DECREASE  metoprolol tartrate (Lopressor) to 12.5 mg in the morning  *If you need a refill on your cardiac medications before your next appointment, please call your pharmacy*   Lab Work: CMET, BNP today  If you have labs (blood work) drawn today and your tests are completely normal, you will receive your results only by: MyChart Message (if you have MyChart) OR A paper copy in the mail If you have any lab test that is abnormal or we need to change your treatment, we will call you to review the results.   Testing/Procedures: ASV titration  Your physician has requested that you have an echocardiogram. Echocardiography is a painless test that uses sound waves to create images of your heart. It provides your doctor with information about the size and shape of your heart and how well your heart's chambers and valves are working. This procedure takes approximately one hour. There are no restrictions for this procedure. Please do NOT wear cologne, perfume, aftershave, or lotions (deodorant is allowed). Please arrive 15 minutes prior to your appointment time.    Follow-Up: At [facility] you and your  health needs are our priority.  As part of our continuing mission to provide you with exceptional heart care, we have created designated Provider Care Teams.  These Care Teams include your primary Cardiologist (physician) and Advanced Practice Providers (APPs -  Physician Assistants and Nurse Practitioners) who all work together to provide you with the care you need, when you need it.  We recommend signing up for the patient portal called "MyChart".  Sign up information is provided on this After Visit Summary.  MyChart is used to connect with patients for Virtual Visits (Telemedicine).  Patients are able to view lab/test results, encounter notes, upcoming appointments, etc.  Non-urgent messages can be sent to your provider as well.   To learn more about what you can do with MyChart, go to [email or web address].    Your next appointment:   F/u after you receive your new machine      Signed, Nicki Guadalajara, MD  11/07/2022 5:41 PM    [facility] [address], [address], [place], [place]  [address] Phone: [phone]

## 2022-11-08 LAB — COMPREHENSIVE METABOLIC PANEL
ALT: 19 IU/L (ref 0–44)
AST: 18 IU/L (ref 0–40)
Albumin/Globulin Ratio: 2 (ref 1.2–2.2)
Albumin: 4.7 g/dL (ref 3.8–4.9)
Alkaline Phosphatase: 72 IU/L (ref 44–121)
BUN/Creatinine Ratio: 24 — ABNORMAL HIGH (ref 9–20)
BUN: 22 mg/dL (ref 6–24)
Bilirubin Total: 0.2 mg/dL (ref 0.0–1.2)
CO2: 24 mmol/L (ref 20–29)
Calcium: 9.7 mg/dL (ref 8.7–10.2)
Chloride: 104 mmol/L (ref 96–106)
Creatinine, Ser: 0.9 mg/dL (ref 0.76–1.27)
Globulin, Total: 2.3 g/dL (ref 1.5–4.5)
Glucose: 100 mg/dL — ABNORMAL HIGH (ref 70–99)
Potassium: 4.6 mmol/L (ref 3.5–5.2)
Sodium: 143 mmol/L (ref 134–144)
Total Protein: 7 g/dL (ref 6.0–8.5)
eGFR: 98 mL/min/{1.73_m2} (ref >59)

## 2022-11-08 LAB — BRAIN NATRIURETIC PEPTIDE: BNP: 34.6 pg/mL (ref 0.0–100.0)

## 2022-11-10 ENCOUNTER — Other Ambulatory Visit: Payer: Self-pay | Admitting: Cardiovascular Disease

## 2022-11-10 DIAGNOSIS — I2510 Atherosclerotic heart disease of native coronary artery without angina pectoris: Secondary | ICD-10-CM

## 2022-11-10 DIAGNOSIS — Z951 Presence of aortocoronary bypass graft: Secondary | ICD-10-CM

## 2022-11-10 DIAGNOSIS — R0602 Shortness of breath: Secondary | ICD-10-CM

## 2022-11-10 DIAGNOSIS — R001 Bradycardia, unspecified: Secondary | ICD-10-CM

## 2022-11-10 DIAGNOSIS — E785 Hyperlipidemia, unspecified: Secondary | ICD-10-CM

## 2022-11-10 DIAGNOSIS — G4731 Primary central sleep apnea: Secondary | ICD-10-CM

## 2022-11-10 DIAGNOSIS — G4733 Obstructive sleep apnea (adult) (pediatric): Secondary | ICD-10-CM

## 2022-11-13 ENCOUNTER — Telehealth: Payer: Self-pay | Admitting: *Deleted

## 2022-11-13 NOTE — Telephone Encounter (Signed)
Patients wife called in to say that she has concerns that since Dr Claiborne Billings dropped his metoprolol to 12.5 mg daily his systolic blood pressure seems to be going up.it has been running anywhere from 146-158. I asked her what has his HR been. She states it has been as low as 55 resting and as much as 100 walking. This morning his blood pressure was 154/76, with a HR of 60 bpm. She was informed that Dr Claiborne Billings is working in the cath lab today, however I will send him a message, if he has a chance to respond to me today I will call her back with his recommendations. Otherwise if she does not hear back from me today he has not responded to me and have the patient to continue his metoprolol as directed by Dr Claiborne Billings until she does hear back from our office. She voiced her understanding and agrees with the plan. Message forwarded to Dr Claiborne Billings for review and recommendation.

## 2022-11-26 ENCOUNTER — Ambulatory Visit: Payer: 59 | Attending: Cardiovascular Disease

## 2022-11-26 DIAGNOSIS — I2510 Atherosclerotic heart disease of native coronary artery without angina pectoris: Secondary | ICD-10-CM | POA: Diagnosis not present

## 2022-11-26 DIAGNOSIS — R0602 Shortness of breath: Secondary | ICD-10-CM | POA: Diagnosis not present

## 2022-11-27 ENCOUNTER — Ambulatory Visit: Payer: 59 | Admitting: Cardiovascular Disease

## 2022-11-27 LAB — ECHOCARDIOGRAM COMPLETE
AR max vel: 2.04 cm2
AV Area VTI: 1.93 cm2
AV Area mean vel: 2 cm2
AV Mean grad: 5 mmHg
AV Peak grad: 9.7 mmHg
Ao pk vel: 1.56 m/s
Area-P 1/2: 3.54 cm2
Calc EF: 60.4 %
MV M vel: 2.87 m/s
MV Peak grad: 32.9 mmHg
S' Lateral: 2.7 cm
Single Plane A2C EF: 59.3 %
Single Plane A4C EF: 60.9 %

## 2022-11-28 ENCOUNTER — Telehealth: Payer: Self-pay | Admitting: *Deleted

## 2022-11-28 NOTE — Telephone Encounter (Signed)
Patient's wife called in to report that the patient's ramp time on his CPAP machine "somehow" went from 30 min down to 5 min. She states that she reset it to 30 minutes. I suggested to her that she have him to turn the machine on while he is awake to see if it goes back to the 5 min ramp time. If it does to contact the DME company to have them to check it out and possibly replace the machine. She also wants Dr Claiborne Billings to know that she has increased the patient's metoprolol back to 12.5 mg BID due to his systolic BP and HR increasing. Since restarting back on the previous dose his BP has been around 118/76 with his HR being in the 60's. Message will be forwarded to Dr Claiborne Billings for review and any further recommendations concerning his metoprolol.

## 2022-12-01 NOTE — Telephone Encounter (Signed)
Called patient, advised of results.   Wife questioned medication- I seen message below.   Advised with wife of message from MD, she states she had started 12.5 mg morning and evening, if needed they will increase to the 25 in the morning and 12.5 mg in the evening.    Patient wife verbalized understanding.

## 2022-12-01 NOTE — Telephone Encounter (Signed)
If blood pressure continues to be elevated can try to take metoprolol 25 mg in the morning and 12.5 mg at night.

## 2022-12-22 ENCOUNTER — Telehealth: Payer: Self-pay | Admitting: Cardiovascular Disease

## 2022-12-22 ENCOUNTER — Other Ambulatory Visit: Payer: Self-pay

## 2022-12-22 MED ORDER — METOPROLOL TARTRATE 25 MG PO TABS: ORAL_TABLET | ORAL | 3 refills | Status: DC

## 2022-12-22 NOTE — Telephone Encounter (Signed)
Pt c/o medication issue:  1. Name of Medication: metoprolol tartrate (LOPRESSOR) 25 MG tablet   2. How are you currently taking this medication (dosage and times per day)? 25mg  in the morning & half a dose in the evening  3. Are you having a reaction (difficulty breathing--STAT)? No  4. What is your medication issue? Pt's wife, Janace Hoard is calling to let Dr. Claiborne Billings know that after it was recommended for the pt to take 25mg  in the morning, and a half dose in the evening, pt's blood pressure's have been great. 117/59 and 112/59 has been the recent bp's. 59 diastolic has been the lowest it has been. Pt's wife is requesting a new script be called in to the pharmacy - [id number] - [place], [place]. Pt's wife is request 135 tablets, due to how she has to cut them up.

## 2022-12-22 NOTE — Telephone Encounter (Signed)
Called patient spoke with wife, advised that previous message from [facility] did advise to take 25 mg in the morning, and 12.5 mg in the evening.   Patient wife verbalized understanding. Sent to pharmacy provided.

## 2023-01-09 ENCOUNTER — Other Ambulatory Visit: Payer: Self-pay | Admitting: Cardiovascular Disease

## 2023-01-14 ENCOUNTER — Telehealth: Payer: Self-pay | Admitting: *Deleted

## 2023-01-14 NOTE — Telephone Encounter (Signed)
Patient notified that based on the most recent download pulled from [facility]. Dr Tresa Endo has ordered some pressure changes. He is to contact us back prior to me leaving on May 3rd.

## 2023-01-28 ENCOUNTER — Ambulatory Visit: Payer: 59 | Admitting: Family Medicine

## 2023-02-04 ENCOUNTER — Other Ambulatory Visit: Payer: Self-pay | Admitting: Cardiovascular Disease

## 2023-02-04 ENCOUNTER — Telehealth: Payer: Self-pay | Admitting: *Deleted

## 2023-02-04 DIAGNOSIS — G4731 Primary central sleep apnea: Secondary | ICD-10-CM

## 2023-02-04 DIAGNOSIS — G4733 Obstructive sleep apnea (adult) (pediatric): Secondary | ICD-10-CM

## 2023-02-04 NOTE — Telephone Encounter (Signed)
Patient's download still showing 6.7 central events on download recently reviewed by Dr Tresa Endo. Per Dr Tresa Endo go ahead and order ASV titration. Patient notified test will be precerted and scheduled.

## 2023-02-13 ENCOUNTER — Telehealth: Payer: Self-pay | Admitting: Cardiovascular Disease

## 2023-02-13 NOTE — Telephone Encounter (Signed)
Patient is following-up to check when his sleep study will be scheduled.

## 2023-02-24 ENCOUNTER — Telehealth: Payer: Self-pay | Admitting: Family Medicine

## 2023-02-24 ENCOUNTER — Ambulatory Visit (INDEPENDENT_AMBULATORY_CARE_PROVIDER_SITE_OTHER): Payer: 59 | Admitting: Family Medicine

## 2023-02-24 ENCOUNTER — Encounter: Payer: Self-pay | Admitting: Family Medicine

## 2023-02-24 VITALS — BP 118/64 | HR 59 | Temp 97.9°F | Ht 68.0 in | Wt 210.6 lb

## 2023-02-24 DIAGNOSIS — E1165 Type 2 diabetes mellitus with hyperglycemia: Secondary | ICD-10-CM

## 2023-02-24 DIAGNOSIS — E119 Type 2 diabetes mellitus without complications: Secondary | ICD-10-CM

## 2023-02-24 DIAGNOSIS — E782 Mixed hyperlipidemia: Secondary | ICD-10-CM

## 2023-02-24 DIAGNOSIS — G4733 Obstructive sleep apnea (adult) (pediatric): Secondary | ICD-10-CM

## 2023-02-24 DIAGNOSIS — E1169 Type 2 diabetes mellitus with other specified complication: Secondary | ICD-10-CM

## 2023-02-24 DIAGNOSIS — I2510 Atherosclerotic heart disease of native coronary artery without angina pectoris: Secondary | ICD-10-CM | POA: Diagnosis not present

## 2023-02-24 DIAGNOSIS — Z7984 Long term (current) use of oral hypoglycemic drugs: Secondary | ICD-10-CM

## 2023-02-24 DIAGNOSIS — F4322 Adjustment disorder with anxiety: Secondary | ICD-10-CM

## 2023-02-24 DIAGNOSIS — M545 Low back pain, unspecified: Secondary | ICD-10-CM

## 2023-02-24 DIAGNOSIS — Z951 Presence of aortocoronary bypass graft: Secondary | ICD-10-CM

## 2023-02-24 LAB — POCT GLYCOSYLATED HEMOGLOBIN (HGB A1C): Hemoglobin A1C: 7.2 % — AB (ref 4.0–5.6)

## 2023-02-24 MED ORDER — SEMAGLUTIDE(0.25 OR 0.5MG/DOS) 2 MG/3ML ~~LOC~~ SOPN: PEN_INJECTOR | SUBCUTANEOUS | 0 refills | Status: DC

## 2023-02-24 MED ORDER — SERTRALINE HCL 100 MG PO TABS: 200.0000 mg | ORAL_TABLET | Freq: Every day | ORAL | 3 refills | Status: DC

## 2023-02-24 NOTE — Progress Notes (Signed)
Subjective  CC:  Chief Complaint  Patient presents with   Diabetes    HPI: Barry Lafon is a 62 y.o. male who presents to the office today for follow up of diabetes and problems listed above in the chief complaint.  Diabetes follow up: His diabetic control is reported as Improved. He has improved his diet; decreased sweets. Eating fruits. Schmidt met and farxiga. He denies exertional CP or SOB or symptomatic hypoglycemia. He denies foot sores or paresthesias. C/o lateral foot pain CAD: reviewed cardiology notes/ stable. Bb and asa and rare lasix. Nl bnp. Cards has not yet cleared him for colonoscopy. Working Schmidt repeat sleep study; Schmidt bipap at night. Hld at goal Schmidt statin Adjustment reaction is stable Schmidt high dose zoloft. Still with problems with boss/interpersonal.   Wt Readings from Last 3 Encounters:  02/24/23 210 lb 9.6 oz (95.5 kg)  11/07/22 207 lb (93.9 kg)  10/29/22 206 lb 3.2 oz (93.5 kg)    BP Readings from Last 3 Encounters:  02/24/23 118/64  11/07/22 118/70  10/29/22 120/72    Assessment  1. Uncontrolled type 2 diabetes mellitus with hyperglycemia (HCC)   2. Coronary artery disease involving native coronary artery of native heart without angina pectoris   3. Combined hyperlipidemia associated with type 2 diabetes mellitus (HCC)   4. OSA (obstructive sleep apnea)   5. S/P CABG x 3, 04/2021   6. Adjustment reaction with anxiety   7. Low back pain, episodic   8. Diabetes mellitus treated with oral medication (HCC)      Plan  Diabetes is currently marginally controlled. Add ozempic to met and farxiga. Education given. CAD stable. HLD at goal OSA Schmidt bipapa Continue high dose zoloft Discussed care of lbp; back exercises and nightly tylenol. Check xrays if worsens. No red flag sxs Foot care discussed.  Follow up: 46mo for DM Orders Placed This Encounter  Procedures   POCT HgB A1C   Meds ordered this encounter  Medications   Semaglutide,0.25 or 0.5MG /DOS, 2  MG/3ML SOPN    Sig: Inject 0.25 mg into the skin once a week for 28 days, THEN 0.5 mg once a week for 28 days.    Dispense:  3 mL    Refill:  0   sertraline (ZOLOFT) 100 MG tablet    Sig: Take 2 tablets (200 mg total) by mouth daily.    Dispense:  180 tablet    Refill:  3    Increasing from 100mg  dose      Immunization History  Administered Date(s) Administered   Influenza,inj,Quad PF,6+ Mos 06/24/2018, 06/03/2019, 06/25/2021   Janssen (J&J) SARS-COV-2 Vaccination 02/29/2020   PNEUMOCOCCAL CONJUGATE-20 10/01/2021   Pneumococcal Polysaccharide-23 10/06/2010   Tdap 10/06/2012, 10/29/2022   Zoster Recombinat (Shingrix) 10/01/2021, 04/02/2022    Diabetes Related Lab Review: Lab Results  Component Value Date   HGBA1C 7.2 (A) 02/24/2023   HGBA1C 7.2 (A) 10/29/2022   HGBA1C 6.9 (A) 04/02/2022    Lab Results  Component Value Date   MICROALBUR <0.7 10/29/2022   Lab Results  Component Value Date   CREATININE 0.90 11/07/2022   BUN 22 11/07/2022   NA 143 11/07/2022   K 4.6 11/07/2022   CL 104 11/07/2022   CO2 24 11/07/2022   Lab Results  Component Value Date   CHOL 98 10/29/2022   CHOL 88 10/01/2021   CHOL 110 04/02/2021   Lab Results  Component Value Date   HDL 52.20 10/29/2022   HDL 49.30  10/01/2021   HDL 51.10 04/02/2021   Lab Results  Component Value Date   LDLCALC 34 10/29/2022   LDLCALC 27 10/01/2021   LDLCALC 42 04/02/2021   Lab Results  Component Value Date   TRIG 58.0 10/29/2022   TRIG 60.0 10/01/2021   TRIG 87.0 04/02/2021   Lab Results  Component Value Date   CHOLHDL 2 10/29/2022   CHOLHDL 2 10/01/2021   CHOLHDL 2 04/02/2021   Lab Results  Component Value Date   LDLDIRECT 90.0 06/23/2018   The ASCVD Risk score (Arnett DK, et al., 2019) failed to calculate for the following reasons:   The valid total cholesterol range is 130 to 320 mg/dL I have reviewed the PMH, Fam and Soc history. 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 May warrant bipap. Avoid supine position    S/P CABG x 3, 04/2021     Priority: High   CAD (coronary artery disease), native coronary artery 04/24/2021    Priority: High   Former smoker 04/02/2021    Priority: High    Quit 11/2019    Controlled type 2 diabetes mellitus with neuropathy (HCC) 10/14/2018    Priority: High    Diagnosed in 2007; was morbidly obese at that time 260 pounds. He lost weight and did well.  Negative urine MAC 07/2018; not Schmidt ACE. Normotensive    Combined hyperlipidemia associated with type 2 diabetes mellitus (HCC) 10/14/2018    Priority: High   Adjustment reaction with anxiety 10/01/2021    Priority: Medium    Adhesive capsulitis of left shoulder 01/19/2020    Priority: Medium    Femoral neuropathy of right lower extremity 10/14/2018    Priority: Medium    Urticaria 01/01/2022    Priority: Low    Social History: Patient  reports that he quit smoking about 3 years ago. His smoking use included cigarettes. He has never used smokeless tobacco. He reports current alcohol use. He reports that he does not use drugs.  Review of Systems: Ophthalmic: negative for eye pain, loss of vision or double vision Cardiovascular: negative for chest pain Respiratory: negative for SOB or persistent cough Gastrointestinal: negative for abdominal pain Genitourinary: negative for dysuria or gross hematuria MSK: negative for foot lesions Neurologic: negative for weakness or gait disturbance  Objective  Vitals: BP 118/64   Pulse (!) 59   Temp 97.9 F (36.6 C)   Ht 5\' 8"  (1.727 m)   Wt 210 lb 9.6 oz (95.5 kg)   SpO2 95%   BMI 32.02 kg/m  General: well appearing, no acute distress  Psych:  Alert and oriented, normal mood and affect HEENT:  Normocephalic, atraumatic, moist mucous membranes, supple  neck  Cardiovascular:  Nl S1 and S2, RRR without murmur, gallop or rub. no edema Respiratory:  Good breath sounds bilaterally, CTAB with normal effort, no rales Gastrointestinal: normal BS, soft, nontender Skin:  Warm, no rashes Neurologic:   Mental status is normal. normal gait Foot exam: no erythema, pallor, or cyanosis visible nl proprioception and sensation to monofilament testing bilaterally, +2 distal pulses bilaterally  Diabetic education: ongoing education regarding chronic disease management for diabetes was given today. We continue to reinforce the ABC's of diabetic management: A1c (<7 or 8 dependent upon patient), tight blood pressure control, and cholesterol management  with goal LDL < 100 minimally. We discuss diet strategies, exercise recommendations, medication options and possible side effects. At each visit, we review recommended immunizations and preventive care recommendations for diabetics and stress that good diabetic control can prevent other problems. See below for this patient's data.   Commons side effects, risks, benefits, and alternatives for medications and treatment plan prescribed today were discussed, and the patient expressed understanding of the given instructions. Patient is instructed to call or message via MyChart if he/she has any questions or concerns regarding our treatment plan. No barriers to understanding were identified. We discussed Red Flag symptoms and signs in detail. Patient expressed understanding regarding what to do in case of urgent or emergency type symptoms.  Medication list was reconciled, printed and provided to the patient in AVS. Patient instructions and summary information was reviewed with the patient as documented in the AVS. This note was prepared with assistance of Dragon voice recognition software. Occasional wrong-word or sound-a-like substitutions may have occurred due to the inherent limitations of voice recognition software

## 2023-02-24 NOTE — Patient Instructions (Addendum)
Please return in 3 months for diabetes follow up   If you have any questions or concerns, please don't hesitate to send me a message via MyChart or call the office at [phone]. Thank you for visiting with [name] today! It's our pleasure caring for you.   Start ozempic weekly; this medication may require a prior authorization.   Back Exercises The following exercises strengthen the muscles that help to support the trunk (torso) and back. They also help to keep the lower back flexible. Doing these exercises can help to prevent or lessen existing low back pain. If you have back pain or discomfort, try doing these exercises 2-3 times each day or as told by your health care provider. As your pain improves, do them once each day, but increase the number of times that you repeat the steps for each exercise (do more repetitions). To prevent the recurrence of back pain, continue to do these exercises once each day or as told by your health care provider. Do exercises exactly as told by your health care provider and adjust them as directed. It is normal to feel mild stretching, pulling, tightness, or discomfort as you do these exercises, but you should stop right away if you feel sudden pain or your pain gets worse. Exercises Single knee to chest Repeat these steps 3-5 times for each leg: Lie on your back on a firm bed or the floor with your legs extended. Bring one knee to your chest. Your other leg should stay extended and in contact with the floor. Hold your knee in place by grabbing your knee or thigh with both hands and hold. Pull on your knee until you feel a gentle stretch in your lower back or buttocks. Hold the stretch for 10-30 seconds. Slowly release and straighten your leg.  Pelvic tilt Repeat these steps 5-10 times: Lie on your back on a firm bed or the floor with your legs extended. Bend your knees so they are pointing toward the ceiling and your feet are flat on the floor. Tighten your  lower abdominal muscles to press your lower back against the floor. This motion will tilt your pelvis so your tailbone points up toward the ceiling instead of pointing to your feet or the floor. With gentle tension and even breathing, hold this position for 5-10 seconds.  Cat-cow Repeat these steps until your lower back becomes more flexible: Get into a hands-and-knees position on a firm bed or the floor. Keep your hands under your shoulders, and keep your knees under your hips. You may place padding under your knees for comfort. Let your head hang down toward your chest. Contract your abdominal muscles and point your tailbone toward the floor so your lower back becomes rounded like the back of a cat. Hold this position for 5 seconds. Slowly lift your head, let your abdominal muscles relax, and point your tailbone up toward the ceiling so your back forms a sagging arch like the back of a cow. Hold this position for 5 seconds.  Press-ups Repeat these steps 5-10 times: Lie on your abdomen (face-down) on a firm bed or the floor. Place your palms near your head, about shoulder-width apart. Keeping your back as relaxed as possible and keeping your hips on the floor, slowly straighten your arms to raise the top half of your body and lift your shoulders. Do not use your back muscles to raise your upper torso. You may adjust the placement of your hands to make yourself more comfortable.  Hold this position for 5 seconds while you keep your back relaxed. Slowly return to lying flat on the floor.  Bridges Repeat these steps 10 times: Lie on your back on a firm bed or the floor. Bend your knees so they are pointing toward the ceiling and your feet are flat on the floor. Your arms should be flat at your sides, next to your body. Tighten your buttocks muscles and lift your buttocks off the floor until your waist is at almost the same height as your knees. You should feel the muscles working in your buttocks  and the back of your thighs. If you do not feel these muscles, slide your feet 1-2 inches (2.5-5 cm) farther away from your buttocks. Hold this position for 3-5 seconds. Slowly lower your hips to the starting position, and allow your buttocks muscles to relax completely. If this exercise is too easy, try doing it with your arms crossed over your chest. Abdominal crunches Repeat these steps 5-10 times: Lie on your back on a firm bed or the floor with your legs extended. Bend your knees so they are pointing toward the ceiling and your feet are flat on the floor. Cross your arms over your chest. Tip your chin slightly toward your chest without bending your neck. Tighten your abdominal muscles and slowly raise your torso high enough to lift your shoulder blades a tiny bit off the floor. Avoid raising your torso higher than that because it can put too much stress on your lower back and does not help to strengthen your abdominal muscles. Slowly return to your starting position.  Back lifts Repeat these steps 5-10 times: Lie on your abdomen (face-down) with your arms at your sides, and rest your forehead on the floor. Tighten the muscles in your legs and your buttocks. Slowly lift your chest off the floor while you keep your hips pressed to the floor. Keep the back of your head in line with the curve in your back. Your eyes should be looking at the floor. Hold this position for 3-5 seconds. Slowly return to your starting position.  Contact a health care provider if: Your back pain or discomfort gets much worse when you do an exercise. Your worsening back pain or discomfort does not lessen within 2 hours after you exercise. If you have any of these problems, stop doing these exercises right away. Do not do them again unless your health care provider says that you can. Get help right away if: You develop sudden, severe back pain. If this happens, stop doing the exercises right away. Do not do them  again unless your health care provider says that you can. This information is not intended to replace advice given to you by your health care provider. Make sure you discuss any questions you have with your health care provider. Document Revised: 03/19/2021 Document Reviewed: 12/05/2020 Elsevier Patient Education  2023 [employer].

## 2023-02-24 NOTE — Telephone Encounter (Signed)
Pt states he thought he was supposed to up the dosage of  sertraline (ZOLOFT) 100 MG tablet  Please advise.

## 2023-02-25 NOTE — Telephone Encounter (Signed)
LVM for pt to continue the 200mg  of the Zoloft per andy with no dosage change.

## 2023-03-11 ENCOUNTER — Telehealth: Payer: Self-pay | Admitting: Cardiovascular Disease

## 2023-03-11 DIAGNOSIS — G4733 Obstructive sleep apnea (adult) (pediatric): Secondary | ICD-10-CM

## 2023-03-11 DIAGNOSIS — I2510 Atherosclerotic heart disease of native coronary artery without angina pectoris: Secondary | ICD-10-CM

## 2023-03-11 DIAGNOSIS — G4731 Primary central sleep apnea: Secondary | ICD-10-CM

## 2023-03-11 DIAGNOSIS — I10 Essential (primary) hypertension: Secondary | ICD-10-CM

## 2023-03-11 NOTE — Telephone Encounter (Signed)
This pt needs colonoscopy done by the end of July- it is authorized through this time. It was put on hold earlier this year. Also, he was supposed to get another in-house sleep study- but has not been contacted to get this done. He wanted to follow up on both of these things.  Explained to him that Dr Tresa Endo is not in the office for a while. I will send him this message. Instructed him to call us back if he has not heard anything in a few days. He verbalized understanding.

## 2023-03-11 NOTE — Telephone Encounter (Signed)
  Pt is calling, he said, he would like to ask Dr. Tresa Endo if its ok to schedule his coloscopy now and also, he is following up his sleep study result

## 2023-03-17 NOTE — Telephone Encounter (Signed)
Make sure the  patient's sleep study is scheduled for me to read.

## 2023-03-20 NOTE — Telephone Encounter (Addendum)
**Note De-Identified Jd Mccaster Obfuscation** I started a Charity fundraiser PA through [employer]. I called the pt to ask if he has BCBS Ins coverage as well as [employer] and he was not available so I s/w his wife as, [name].  [name] stated that the pt know longer has BCBS ins coverage and only has U[employer].  She also stated that this PA has already been approved as they received a letter from [facility] on 5/3 advising them it was. She stated that Burna Mortimer advised her she was going to do the PA before she retired on 5/1.  She verified that the procedure code listed in the approval letter is [phone] which is the correct code for this test and that the Authorization # is [id number].  She is requesting a call back with appt ASAP for this test to be done as the time frame for the approval is running out.

## 2023-03-20 NOTE — Telephone Encounter (Signed)
**Note De-Identified Barry Schmidt Obfuscation** See phone note from 5/1 for update.

## 2023-03-23 ENCOUNTER — Other Ambulatory Visit: Payer: Self-pay | Admitting: Family Medicine

## 2023-03-23 ENCOUNTER — Other Ambulatory Visit: Payer: Self-pay

## 2023-03-23 DIAGNOSIS — E1165 Type 2 diabetes mellitus with hyperglycemia: Secondary | ICD-10-CM

## 2023-03-23 MED ORDER — SEMAGLUTIDE-WEIGHT MANAGEMENT 0.5 MG/0.5ML ~~LOC~~ SOAJ
0.5000 mg | SUBCUTANEOUS | 3 refills | Status: DC
Start: 2023-03-23 — End: 2023-05-21

## 2023-03-24 NOTE — Telephone Encounter (Signed)
Patient initially called for a refill of ozempic 0.5 mg and I informed him it had been sent in yesterday. Pt verbalized understanding. Moments later wife called stating pharmacy texted pt stating that semaglutide with W is available.   Patient's spouse stated she called for clarification and they informed her it is similar to ozempic and if needs to be changed to ask PCP office. Caller and patient are requested regular ozempic 0.5 mg be sent in; not the weight management kind.

## 2023-03-25 ENCOUNTER — Other Ambulatory Visit: Payer: Self-pay | Admitting: Family Medicine

## 2023-03-27 ENCOUNTER — Ambulatory Visit (HOSPITAL_BASED_OUTPATIENT_CLINIC_OR_DEPARTMENT_OTHER): Payer: 59 | Admitting: Cardiovascular Disease

## 2023-03-31 ENCOUNTER — Telehealth: Payer: Self-pay | Admitting: Family Medicine

## 2023-03-31 ENCOUNTER — Other Ambulatory Visit: Payer: Self-pay

## 2023-03-31 NOTE — Telephone Encounter (Signed)
Prescription Request  03/31/2023  LOV: 02/24/2023  What is the name of the medication or equipment?  Semaglutide,0.25 or 0.5MG /DOS, 2 MG/3ML SOPN   Have you contacted your pharmacy to request a refill? Yes   Which pharmacy would you like this sent to? [phone] [place] [place] - [address] [place] [address] Phone: [phone] Fax: [phone]   Patient notified that their request is being sent to the clinical staff for review and that they should receive a response within 2 business days.   Please advise at Mobile [phone] (mobile)

## 2023-03-31 NOTE — Telephone Encounter (Signed)
the 0.25mg /5mg  dose removed

## 2023-04-02 ENCOUNTER — Ambulatory Visit (HOSPITAL_BASED_OUTPATIENT_CLINIC_OR_DEPARTMENT_OTHER): Payer: 59 | Admitting: Cardiovascular Disease

## 2023-04-02 ENCOUNTER — Other Ambulatory Visit: Payer: Self-pay

## 2023-04-02 DIAGNOSIS — E1165 Type 2 diabetes mellitus with hyperglycemia: Secondary | ICD-10-CM

## 2023-04-02 MED ORDER — OZEMPIC (0.25 OR 0.5 MG/DOSE) 2 MG/3ML ~~LOC~~ SOPN
0.5000 mg | PEN_INJECTOR | SUBCUTANEOUS | 5 refills | Status: DC
Start: 2023-04-02 — End: 2023-06-05

## 2023-04-02 NOTE — Telephone Encounter (Signed)
Rx sent 

## 2023-04-02 NOTE — Telephone Encounter (Signed)
[facility] states [facility] but insurance will only pay for Ozempic. They need new RX for Ozempic. Please advise.

## 2023-04-06 ENCOUNTER — Ambulatory Visit (HOSPITAL_BASED_OUTPATIENT_CLINIC_OR_DEPARTMENT_OTHER): Payer: BLUE CROSS/BLUE SHIELD | Attending: Cardiovascular Disease | Admitting: Cardiovascular Disease

## 2023-04-06 VITALS — Ht 68.0 in | Wt 206.0 lb

## 2023-04-06 DIAGNOSIS — G4733 Obstructive sleep apnea (adult) (pediatric): Secondary | ICD-10-CM | POA: Diagnosis not present

## 2023-04-06 DIAGNOSIS — G4731 Primary central sleep apnea: Secondary | ICD-10-CM

## 2023-04-06 DIAGNOSIS — I2510 Atherosclerotic heart disease of native coronary artery without angina pectoris: Secondary | ICD-10-CM | POA: Diagnosis not present

## 2023-04-06 DIAGNOSIS — I10 Essential (primary) hypertension: Secondary | ICD-10-CM

## 2023-04-08 ENCOUNTER — Encounter: Payer: Self-pay | Admitting: Gastroenterology

## 2023-04-15 ENCOUNTER — Telehealth: Payer: Self-pay | Admitting: *Deleted

## 2023-04-15 NOTE — Telephone Encounter (Signed)
Dr. Myrtie Neither,  I am prepping this pt's chart for PV. His colonoscopy is scheduled for 05-21-23.  I noted this in his referral info: He said he was advised by cardiologist to hold off on doing the colonoscopy at this time. Patient said he would call back when he's gotten the all clear.   I do not see that he has seen Dr. Tresa Endo since 07-25-22 in the office.  He was seen his PCP several times. I called and spoke with the pt.  He said his PCP gave him the "all clear" to proceed.  He is no cardiac issues, no SOB, chest pain.  His ECHO 11-26-22 showed EF of 60-65%, no AVS.  Is he ok to proceed as scheduled?  Please advise.  Thanks, [employer]

## 2023-04-17 NOTE — Telephone Encounter (Signed)
Thank you for the note.  I reviewed Dr. Landry Schmidt comprehensive office note from October 2023, and I do not see any mention in there of his concern or reluctance regarding this patient having a colonoscopy.  His subsequent echocardiogram was reassuring as you documented above.  I reviewed the May 2021 primary care note, in which they make mention that Mr. Bechel had not been cleared by cardiology, but I do not see any further comments or opinion from them on that.  I agree that his cardiac condition sounds stable as near as I can determine from the records.  Nevertheless, with some reported cardiology concern mentioned by the patient, I will copy this note to both his PCP and Dr. Tresa Schmidt to be sure neither of them have objections to proceeding with the colonoscopy as scheduled.  As an aside, you may already have noticed this, but the patient was started on semaglutide within the last few months, and that will need to be held before the procedure per our usual protocol.  - Barry Schmidt   Drs. Barry Schmidt and Barry Schmidt,   Please see above and comment so we know how to proceed with a screening colonoscopy on this patient scheduled for next month.  Thanks    - Barry Jupiter, MD    [facility]

## 2023-04-19 ENCOUNTER — Encounter (HOSPITAL_BASED_OUTPATIENT_CLINIC_OR_DEPARTMENT_OTHER): Payer: Self-pay | Admitting: Cardiovascular Disease

## 2023-04-19 NOTE — Procedures (Signed)
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  MEDICATIONS OZEMPIC, 0.25 OR 0.5 MG/DOSE, 2 MG/3ML SOPN Semaglutide-Weight Management 0.5 MG/0.5ML SOAJ ACCU-CHEK AVIVA PLUS test strip acetaminophen (TYLENOL) 325 MG tablet ALPRAZolam (XANAX) 0.5 MG tablet aspirin EC 81 MG tablet Blood Glucose Monitoring Suppl (ONE TOUCH ULTRA 2) w/Device KIT dapagliflozin propanediol (FARXIGA) 10 MG TABS tablet furosemide (LASIX) 20 MG tablet Lancets (ONETOUCH ULTRASOFT) lancets metFORMIN (GLUCOPHAGE) 1000 MG tablet metoprolol tartrate (LOPRESSOR) 25 MG tablet nitroGLYCERIN (NITROSTAT) 0.4 MG SL tablet (Expired)rosuvastatin (CRESTOR) 40 MG tablet sertraline (ZOLOFT) 100 MG tablet Medications self-administered by patient taken the night of the study : N/A  SLEEP STUDY TECHNIQUE As per the AASM Manual for the Scoring of Sleep and Associated Events v2.3 (April 2016) with a hypopnea requiring 4% desaturations.  The channels recorded and monitored were frontal, central and occipital EEG, electrooculogram (EOG), submentalis EMG (chin), nasal and oral airflow, thoracic and abdominal wall motion, anterior tibialis EMG, snore microphone, electrocardiogram, and pulse oximetry.  RESPIRATORY PARAMETERS Optimal Min IPAP (cm): 7 Optimal Max IPAP (cm): 7 Optimal Min EPAP (cm): 7 Optimal Max EPAP (cm): 7 Optimal Max Pressure (cm): 15 Optimal Min PS (cm): 3 Optimal Max PS (cm): 15 Opitmal Breathing Rate (/min): Auto Overall Min O2 (%): 92.0 Min O2 at Optimal Pressure (%): 92.0 AHI at Optimal  (/hr): N/A   SLEEP ARCHITECTURE During a recording time of 432.8 minutes, the patient slept for 345 minutes. Sleep efficiency was 79.7%. The patient spent 11.6% of the night in stage N1 sleep, 81.9% in stage N2 sleep, 0.0% in stage N3 and 6.5% in REM. Wake after sleep onset (WASO) was 50.1 minutes. Alpha intrusion was PRESENT ABSENT. Supine sleep was 2.03%. The arousal index was 14.6.  LEG MOVEMENT DATA PLM Index (/hr): 9.0 PLM Arousal Index (/hr): 1.0  CARDIAC DATA The 2 lead EKG demonstrated sinus rhythm. The mean heart rate was 63.0 beats per minute. Other EKG findings include: None.  IMPRESSIONS - No significant obstructive sleep apnea occurred during this study (AHI 0.0/h).  ASV was initiated at  EPAP of 5 and pressure supprt range of 3 - 15 cm of water. An optimal ASV pressure was selected at EPAP 7, min PS 3 and max PS 15 (AHI 0; O2 nadir 93% without any central events).  - The patient had minimal or no oxygen desaturation during the study (Min O2 92.0) - No snoring was audible during this study. - No cardiac abnormalities were noted during this study. - Clinically significant periodic limb movements did not occur during sleep.  DIAGNOSIS - Obstructive Sleep Apnea (G47.33)  RECOMMENDATIONS - Trial of SV Advance EPAP Min 7 and Max 7 cmH2O, Pressure Support Min 3 and Max 15 cmH2O with Max Pressure of 7 cmH2O and Breath Rate of Auto BrPM. - Effort should be made to optimize nasal and oropharyngeal patency. - Avoid alcohol, sedatives and other CNS depressants that may worsen sleep apnea and disrupt normal sleep architecture. - Sleep hygiene should be reviewed to assess factors that may improve sleep quality. - Weight management and regular exercise should be initiated or continued. - Recommend a download and sleep  clinic evaluation after 4-6 weeks of therapy.  [Electronically signed] 04/19/2023 02:07 PM  Nicki Guadalajara MD, [facility], ABSM Diplomate, American Board of Sleep  Medicine  NPI: [id number]  [facility] PH: [phone]   FX: [phone] ACCREDITED BY THE AMERICAN ACADEMY OF SLEEP MEDICINE

## 2023-04-20 ENCOUNTER — Telehealth: Payer: Self-pay | Admitting: Cardiovascular Disease

## 2023-04-20 NOTE — Telephone Encounter (Signed)
I read his sleep study this weekend.  Brandie to set up ASV with DME

## 2023-04-20 NOTE — Telephone Encounter (Signed)
Spoke to the patient, he will like Dr. Tresa Endo to read his sleep study results . He will like for Dr.kelly to give his recommendation. Will forward to MD and nurse for advise.

## 2023-04-20 NOTE — Telephone Encounter (Signed)
Patient would like to have Dr. Tresa Endo review his sleep study soon if possible.

## 2023-04-23 NOTE — Progress Notes (Signed)
Notified patient of sleep study results and recommendations. All questions were answered. Patient verbalized understanding. Orders for CPAP machine and supplies sent to [facility] today 04/23/23.

## 2023-04-27 ENCOUNTER — Other Ambulatory Visit: Payer: Self-pay

## 2023-04-27 DIAGNOSIS — G4733 Obstructive sleep apnea (adult) (pediatric): Secondary | ICD-10-CM

## 2023-04-28 ENCOUNTER — Ambulatory Visit (AMBULATORY_SURGERY_CENTER): Payer: 59 | Admitting: *Deleted

## 2023-04-28 ENCOUNTER — Encounter: Payer: Self-pay | Admitting: Gastroenterology

## 2023-04-28 VITALS — Ht 68.0 in | Wt 203.0 lb

## 2023-04-28 DIAGNOSIS — Z1211 Encounter for screening for malignant neoplasm of colon: Secondary | ICD-10-CM

## 2023-04-28 MED ORDER — NA SULFATE-K SULFATE-MG SULF 17.5-3.13-1.6 GM/177ML PO SOLN
1.0000 | Freq: Once | ORAL | 0 refills | Status: AC
Start: 2023-04-28 — End: 2023-04-28

## 2023-04-28 NOTE — Progress Notes (Signed)
Pt's name and DOB verified at the beginning of the pre-visit.  Pt denies any difficulty with ambulating,sitting, laying down or rolling side to side Gave both [facility] main # and MD on call # prior to instructions.  No egg or soy allergy known to patient  No issues known to pt with past sedation with any surgeries or procedures Pt denies having issues being intubated Patient denies ever being intubated Pt has no issues moving head neck or swallowing No FH of Malignant Hyperthermia Pt is not on diet pills Pt is not on home 02  Pt is not on blood thinners  Pt denies issues with constipation  Pt is not on dialysis Pt denise any abnormal heart rhythms  Pt  upcoming cardiac testing next in Auguast 2024 Pt encouraged to use to use Singlecare or Goodrx to reduce cost  Patient's chart reviewed by Cathlyn Parsons CNRA prior to pre-visit and patient appropriate for the [facility].  Pre-visit completed and red dot placed by patient's name on their procedure day (on provider's schedule).  . Visit by phone Pt states weight is 203 lb Instructed pt why it is important to and  to call if they have any changes in health or new medications. Directed them to the # given and on instructions.   Pt states they will.  Instructions reviewed with pt and pt states understanding. Instructed to review again prior to procedure. Pt states they will.  Instructions sent by mail with coupon and by my chart

## 2023-04-29 ENCOUNTER — Other Ambulatory Visit: Payer: Self-pay | Admitting: Family Medicine

## 2023-05-05 NOTE — Telephone Encounter (Signed)
I saw him in the office in February 2024.  Several weeks ago he had his sleep study and I will be needing to see him following initiation of therapy.

## 2023-05-06 ENCOUNTER — Encounter (INDEPENDENT_AMBULATORY_CARE_PROVIDER_SITE_OTHER): Payer: Self-pay

## 2023-05-12 NOTE — Telephone Encounter (Signed)
Dr. Earna Coder covering physician)  This patient is scheduled for a colonoscopy with [name] on 05/21/2023.  I would greatly appreciate your comment here on whether or not you feel this patient is acceptable to proceed with his colonoscopy as scheduled, or whether you will defer judgment on that until you see him next in clinic.  Then we will know whether or not to keep that procedure slot for him or cancel it and open it for another patient.  As previously noted, there was nothing in your last office note to suggest you had reservations about him undergoing a colonoscopy, but the patient brought this concern to [name] because that he was under the impression that you might.  Thank you  Amada Jupiter, [facility]

## 2023-05-19 NOTE — Telephone Encounter (Signed)
Thank you, Dr. Tresa Endo. ___________  Baxter Hire, Please let this patient know Dr. Tresa Endo agreed we could proceed with the colonoscopy as scheduled this week.  - H. Danis

## 2023-05-19 NOTE — Telephone Encounter (Signed)
He is stable to undergo planned colonoscopy from a cardiac standpoint.  He has sleep apnea with both obstructive and central events and recently underwent an ASV titration. Daphene Jaeger

## 2023-05-19 NOTE — Telephone Encounter (Signed)
Spoke with pt and made aware that ok to proceed per Dr. Nicholaus Bloom.  Understanding voiced

## 2023-05-21 ENCOUNTER — Ambulatory Visit (AMBULATORY_SURGERY_CENTER): Payer: 59 | Admitting: Gastroenterology

## 2023-05-21 ENCOUNTER — Encounter: Payer: Self-pay | Admitting: Gastroenterology

## 2023-05-21 VITALS — BP 99/54 | HR 65 | Temp 98.0°F | Resp 15 | Ht 68.0 in | Wt 203.0 lb

## 2023-05-21 DIAGNOSIS — D120 Benign neoplasm of cecum: Secondary | ICD-10-CM | POA: Diagnosis not present

## 2023-05-21 DIAGNOSIS — Z1211 Encounter for screening for malignant neoplasm of colon: Secondary | ICD-10-CM | POA: Diagnosis present

## 2023-05-21 DIAGNOSIS — D123 Benign neoplasm of transverse colon: Secondary | ICD-10-CM

## 2023-05-21 HISTORY — PX: COLONOSCOPY: SHX174

## 2023-05-21 MED ORDER — SODIUM CHLORIDE 0.9 % IV SOLN: 500.0000 mL | Freq: Once | INTRAVENOUS | Status: DC

## 2023-05-21 NOTE — Progress Notes (Signed)
Called to room to assist during endoscopic procedure.  Patient ID and intended procedure confirmed with present staff. Received instructions for my participation in the procedure from the performing physician.  

## 2023-05-21 NOTE — Patient Instructions (Signed)
Thank you for letting us take care of your healthcare needs today. Please see handouts given to you on Polyps and Hemorrhoids.    YOU HAD AN ENDOSCOPIC PROCEDURE TODAY AT THE [facility]:   Refer to the procedure report that was given to you for any specific questions about what was found during the examination.  If the procedure report does not answer your questions, please call your gastroenterologist to clarify.  If you requested that your care partner not be given the details of your procedure findings, then the procedure report has been included in a sealed envelope for you to review at your convenience later.  YOU SHOULD EXPECT: Some feelings of bloating in the abdomen. Passage of more gas than usual.  Walking can help get rid of the air that was put into your GI tract during the procedure and reduce the bloating. If you had a lower endoscopy (such as a colonoscopy or flexible sigmoidoscopy) you may notice spotting of blood in your stool or on the toilet paper. If you underwent a bowel prep for your procedure, you may not have a normal bowel movement for a few days.  Please Note:  You might notice some irritation and congestion in your nose or some drainage.  This is from the oxygen used during your procedure.  There is no need for concern and it should clear up in a day or so.  SYMPTOMS TO REPORT IMMEDIATELY:  Following lower endoscopy (colonoscopy or flexible sigmoidoscopy):  Excessive amounts of blood in the stool  Significant tenderness or worsening of abdominal pains  Swelling of the abdomen that is new, acute  Fever of 100F or higher   For urgent or emergent issues, a gastroenterologist can be reached at any hour by calling [phone]. Do not use MyChart messaging for urgent concerns.    DIET:  We do recommend a small meal at first, but then you may proceed to your regular diet.  Drink plenty of fluids but you should avoid alcoholic beverages for 24  hours.  ACTIVITY:  You should plan to take it easy for the rest of today and you should NOT DRIVE or use heavy machinery until tomorrow (because of the sedation medicines used during the test).    FOLLOW UP: Our staff will call the number listed on your records the next business day following your procedure.  We will call around 7:15- 8:00 am to check on you and address any questions or concerns that you may have regarding the information given to you following your procedure. If we do not reach you, we will leave a message.     If any biopsies were taken you will be contacted by phone or by letter within the next 1-3 weeks.  Please call us at [phone] if you have not heard about the biopsies in 3 weeks.    SIGNATURES/CONFIDENTIALITY: You and/or your care partner have signed paperwork which will be entered into your electronic medical record.  These signatures attest to the fact that that the information above on your After Visit Summary has been reviewed and is understood.  Full responsibility of the confidentiality of this discharge information lies with you and/or your care-partner. 

## 2023-05-21 NOTE — Progress Notes (Signed)
Uneventful anesthetic. Report to pacu rn. Vss. Care resumed by rn. 

## 2023-05-21 NOTE — Op Note (Addendum)
[facility] Patient Name: Barry Schmidt Procedure Date: 05/21/2023 10:35 AM MRN: [id number] Endoscopist: Sherilyn Cooter L. Myrtie Neither , MD, [id number] Age: 62 Referring MD:  Date of Birth: 1960-11-14 Gender: Male Account #: [id number] Procedure:                Colonoscopy Indications:              Screening for colorectal malignant neoplasm                           Last colonoscopy reportedly 10 or more years ago                            [place]. Medicines:                Monitored Anesthesia Care Procedure:                Pre-Anesthesia Assessment:                           - Prior to the procedure, a History and Physical                            was performed, and patient medications and                            allergies were reviewed. The patient's tolerance of                            previous anesthesia was also reviewed. The risks                            and benefits of the procedure and the sedation                            options and risks were discussed with the patient.                            All questions were answered, and informed consent                            was obtained. Prior Anticoagulants: The patient has                            taken no anticoagulant or antiplatelet agents. ASA                            Grade Assessment: III - A patient with severe                            systemic disease. After reviewing the risks and                            benefits, the patient was deemed in satisfactory  condition to undergo the procedure.                           After obtaining informed consent, the colonoscope                            was passed under direct vision. Throughout the                            procedure, the patient's blood pressure, pulse, and                            oxygen saturations were monitored continuously. The                            Olympus CF-HQ190L ([id number]) Colonoscope was                             introduced through the anus and advanced to the the                            cecum, identified by appendiceal orifice and                            ileocecal valve. The colonoscopy was performed                            without difficulty. The patient tolerated the                            procedure well. The quality of the bowel                            preparation was good. The ileocecal valve,                            appendiceal orifice, and rectum were photographed. Scope In: 10:52:32 AM Scope Out: 11:09:31 AM Scope Withdrawal Time: 0 hours 13 minutes 8 seconds  Total Procedure Duration: 0 hours 16 minutes 59 seconds  Findings:                 The perianal and digital rectal examinations were                            normal.                           Repeat examination of right colon under NBI                            performed.                           Two sessile polyps were found in the proximal  transverse colon and cecum. The polyps were 3 to 8                            mm in size. These polyps were removed with a cold                            snare. Resection and retrieval were complete.                           Internal hemorrhoids were found.                           The exam was otherwise without abnormality on                            direct and retroflexion views. Complications:            No immediate complications. Estimated Blood Loss:     Estimated blood loss was minimal. Impression:               - Two 3 to 8 mm polyps in the proximal transverse                            colon and in the cecum, removed with a cold snare.                            Resected and retrieved.                           - Internal hemorrhoids.                           - The examination was otherwise normal on direct                            and retroflexion views. Recommendation:           - Patient has a contact  number available for                            emergencies. The signs and symptoms of potential                            delayed complications were discussed with the                            patient. Return to normal activities tomorrow.                            Written discharge instructions were provided to the                            patient.                           - Resume previous diet.                           -  Continue present medications.                           - Await pathology results.                           - Repeat colonoscopy is recommended for                            surveillance. The colonoscopy date will be                            determined after pathology results from today's                            exam become available for review. Nakota Elsen L. Myrtie Neither, MD 05/21/2023 11:12:43 AM This report has been signed electronically.

## 2023-05-21 NOTE — Progress Notes (Signed)
Pt's states no medical or surgical changes since previsit or office visit. 

## 2023-05-21 NOTE — Progress Notes (Signed)
History and Physical:  This patient presents for endoscopic testing for: Encounter Diagnosis  Name Primary?   Special screening for malignant neoplasms, colon Yes    Screening colonoscopy today See recent chart notes re: cardiac clearance   Patient is otherwise without complaints or active issues today.   Past Medical History: Past Medical History:  Diagnosis Date   Diabetes mellitus without complication (HCC)    Hyperlipidemia    Hypertension    On statin therapy due to risk of future cardiovascular event 10/14/2018   OSA (obstructive sleep apnea) 09/16/2021   Central and obstructive: sleep study 09/2021; cpap started. May warrant bipap. Avoid supine position   S/P CABG x 3, 04/2021    Sleep apnea    Uncontrolled diabetes mellitus without complication, with long-term current use of insulin 06/23/2018   Diagnosed in 2007; was morbidly obese at that time 260 pounds. He lost weight and did well.      Past Surgical History: Past Surgical History:  Procedure Laterality Date   CORONARY ARTERY BYPASS GRAFT N/A 04/25/2021   Procedure: CORONARY ARTERY BYPASS GRAFTING (CABG)x 3 ON CARDIOPULMONARY BYPASS USING LIMA, LEFT RADIAL ARTERY AND  GSV.;  Surgeon: Loreli Slot, MD;  Location: [facility] OR;  Service: Open Heart Surgery;  Laterality: N/A;   ENDOVEIN HARVEST OF GREATER SAPHENOUS VEIN Bilateral 04/25/2021   Procedure: ENDOVEIN HARVEST OF GREATER SAPHENOUS VEIN;  Surgeon: Loreli Slot, MD;  Location: [facility] OR;  Service: Open Heart Surgery;  Laterality: Bilateral;   LEFT HEART CATH AND CORONARY ANGIOGRAPHY N/A 04/24/2021   Procedure: LEFT HEART CATH AND CORONARY ANGIOGRAPHY;  Surgeon: Lennette Bihari, MD;  Location: [facility] INVASIVE CV LAB;  Service: Cardiovascular;  Laterality: N/A;   RADIAL ARTERY HARVEST Left 04/25/2021   Procedure: RADIAL ARTERY HARVEST;  Surgeon: Loreli Slot, MD;  Location: [facility] OR;  Service: Open Heart Surgery;  Laterality: Left;   TEE WITHOUT  CARDIOVERSION N/A 04/25/2021   Procedure: TRANSESOPHAGEAL ECHOCARDIOGRAM (TEE);  Surgeon: Loreli Slot, MD;  Location: [facility] OR;  Service: Open Heart Surgery;  Laterality: N/A;   TONSILECTOMY/ADENOIDECTOMY WITH MYRINGOTOMY  2004   Uvilaectomy.    Allergies: Allergies  Allergen Reactions   Penicillins Hives    Reaction: 15-20 years ago    Outpatient Meds: Current Outpatient Medications  Medication Sig Dispense Refill   ACCU-CHEK AVIVA PLUS test strip USE AS INSTRUCTED TO TEST BLOOD GLUCOSE 100 strip 12   aspirin EC 81 MG tablet Take 81 mg by mouth at bedtime.     Blood Glucose Monitoring Suppl (ONE TOUCH ULTRA 2) w/Device KIT 1 kit by Does not apply route 2 (two) times daily. 1 each 0   dapagliflozin propanediol (FARXIGA) 10 MG TABS tablet Take 1 tablet (10 mg total) by mouth daily. 90 tablet 3   furosemide (LASIX) 20 MG tablet TAKE 1 TABLET BY MOUTH EVERY DAY AS NEEDED 90 tablet 1   Lancets (ONETOUCH ULTRASOFT) lancets Use as instructed 100 each 12   metFORMIN (GLUCOPHAGE) 1000 MG tablet Take 1 tablet (1,000 mg total) by mouth 2 (two) times daily with a meal. 180 tablet 3   metoprolol tartrate (LOPRESSOR) 25 MG tablet Take 1 tablet (25 mg) in the morning, and 0.5 tablet (12.5 mg) in the evening. (Patient taking differently: Take 1 tablet (12.5 mg) in the morning, and 0.5 tablet (12.5 mg) in the evening.) 135 tablet 3   rosuvastatin (CRESTOR) 40 MG tablet TAKE 1 TABLET BY MOUTH EVERYDAY AT BEDTIME 90 tablet 3  sertraline (ZOLOFT) 100 MG tablet Take 2 tablets (200 mg total) by mouth daily. 180 tablet 3   acetaminophen (TYLENOL) 325 MG tablet Take 2 tablets (650 mg total) by mouth every 6 (six) hours as needed for mild pain.     ALPRAZolam (XANAX) 0.5 MG tablet Take 1 tablet (0.5 mg total) by mouth daily as needed for anxiety. 20 tablet 1   nitroGLYCERIN (NITROSTAT) 0.4 MG SL tablet Place 1 tablet (0.4 mg total) under the tongue every 5 (five) minutes as needed for chest pain. 25  tablet 6   OZEMPIC, 0.25 OR 0.5 MG/DOSE, 2 MG/3ML SOPN Inject 0.5 mg into the skin once a week. 3 mL 5   Current Facility-Administered Medications  Medication Dose Route Frequency Provider Last Rate Last Admin   0.9 %  sodium chloride infusion  500 mL Intravenous Once Charlie Pitter III, MD          ___________________________________________________________________ Objective   Exam:  BP 111/66   Pulse 61   Temp 98 F (36.7 C) (Temporal)   Ht 5\' 8"  (1.727 m)   Wt 203 lb (92.1 kg)   SpO2 100%   BMI 30.87 kg/m   CV: regular , S1/S2 Resp: clear to auscultation bilaterally, normal RR and effort noted GI: soft, no tenderness, with active bowel sounds.   Assessment: Encounter Diagnosis  Name Primary?   Special screening for malignant neoplasms, colon Yes     Plan: Colonoscopy   The benefits and risks of the planned procedure were described in detail with the patient or (when appropriate) their health care proxy.  Risks were outlined as including, but not limited to, bleeding, infection, perforation, adverse medication reaction leading to cardiac or pulmonary decompensation, pancreatitis (if ERCP).  The limitation of incomplete mucosal visualization was also discussed.  No guarantees or warranties were given.  The patient is appropriate for an endoscopic procedure in the ambulatory setting.   - Barry Jupiter, MD

## 2023-05-22 ENCOUNTER — Telehealth: Payer: Self-pay

## 2023-05-22 NOTE — Telephone Encounter (Signed)
Left message on follow up call. 

## 2023-05-27 ENCOUNTER — Encounter: Payer: Self-pay | Admitting: Gastroenterology

## 2023-05-27 IMAGING — CR DG CHEST 2V
2 series · 2 of 2 positions shown · non-contrast
Comparison: None.

CLINICAL DATA: Cardiogenic failure

EXAM:
CHEST - 2 VIEW

[chest pa]
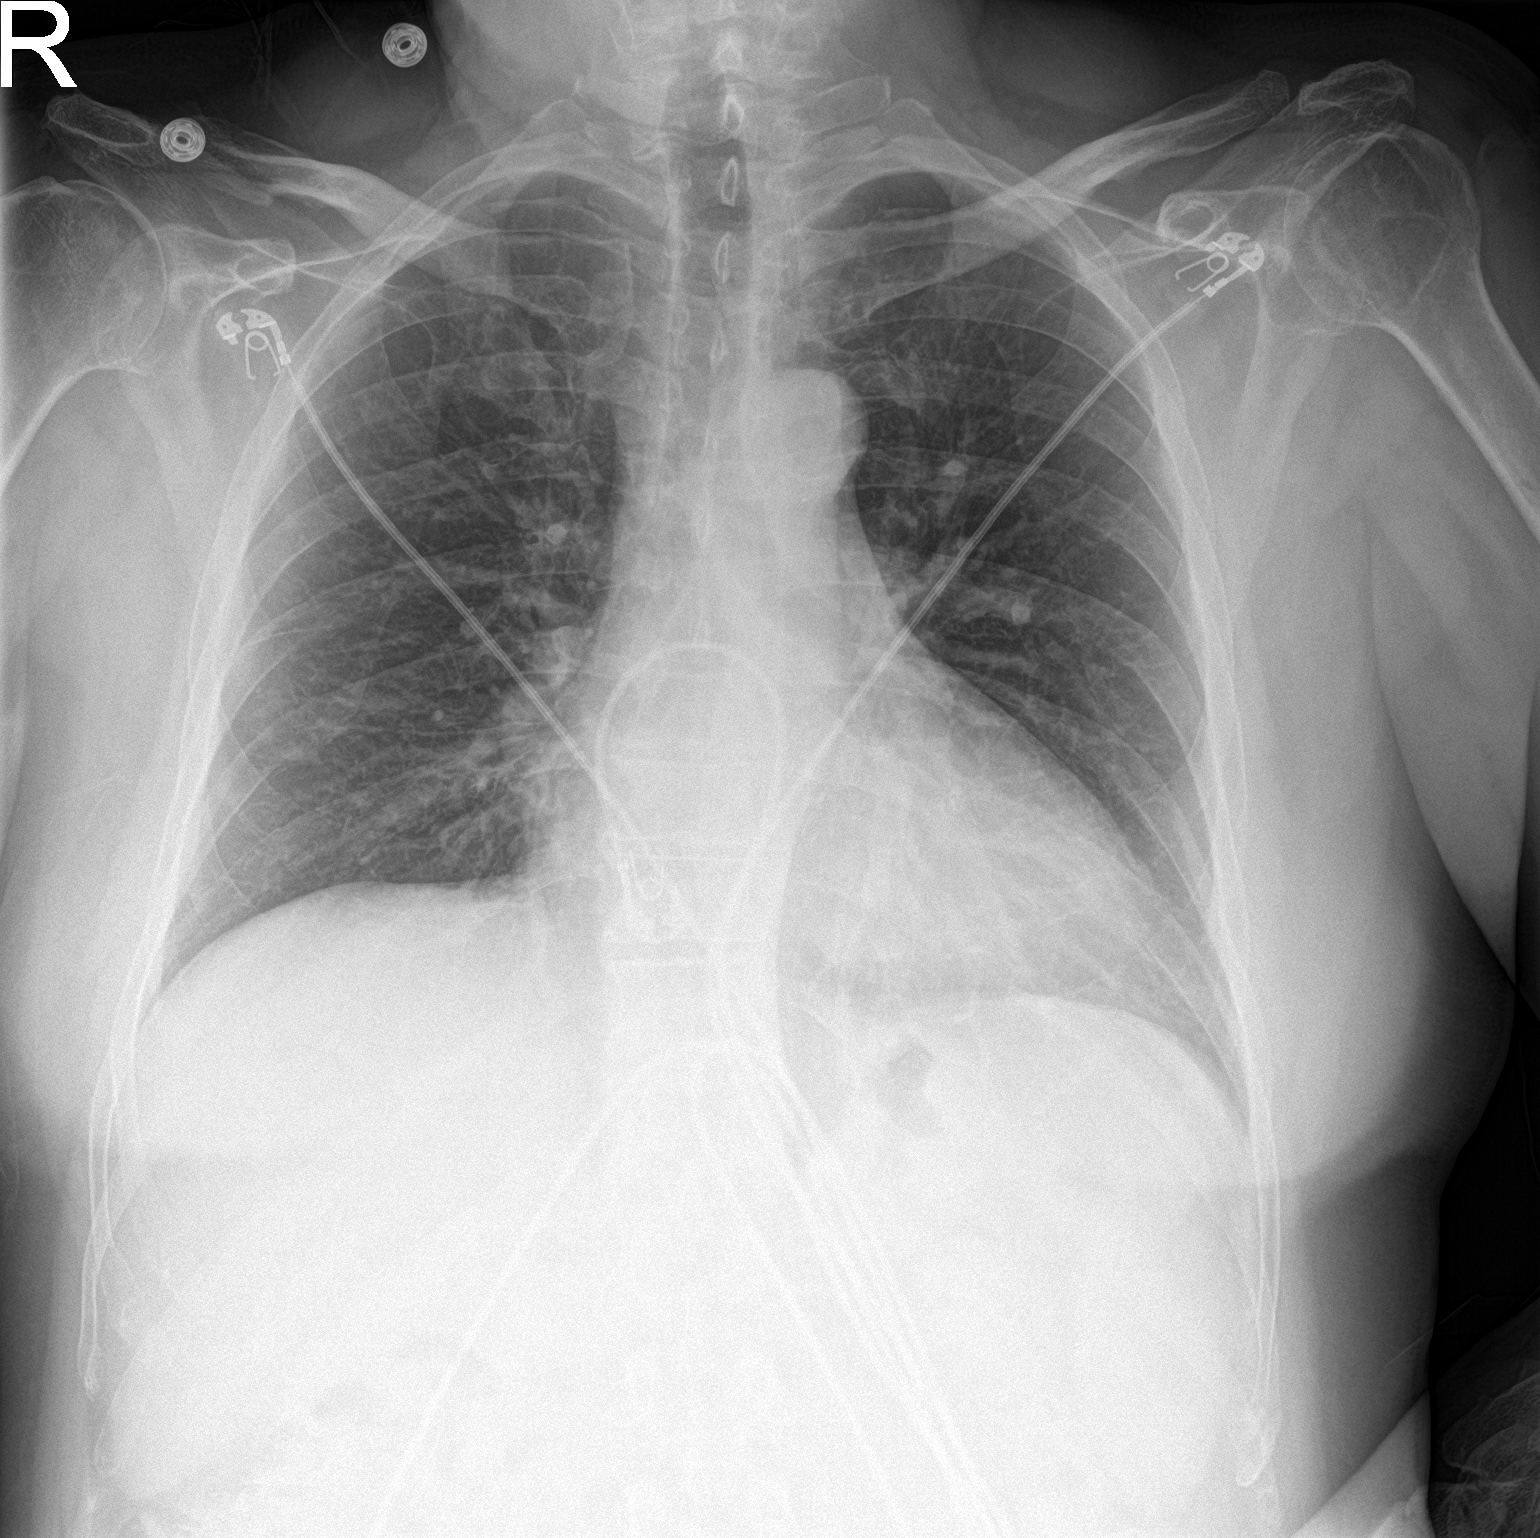

[chest lat]
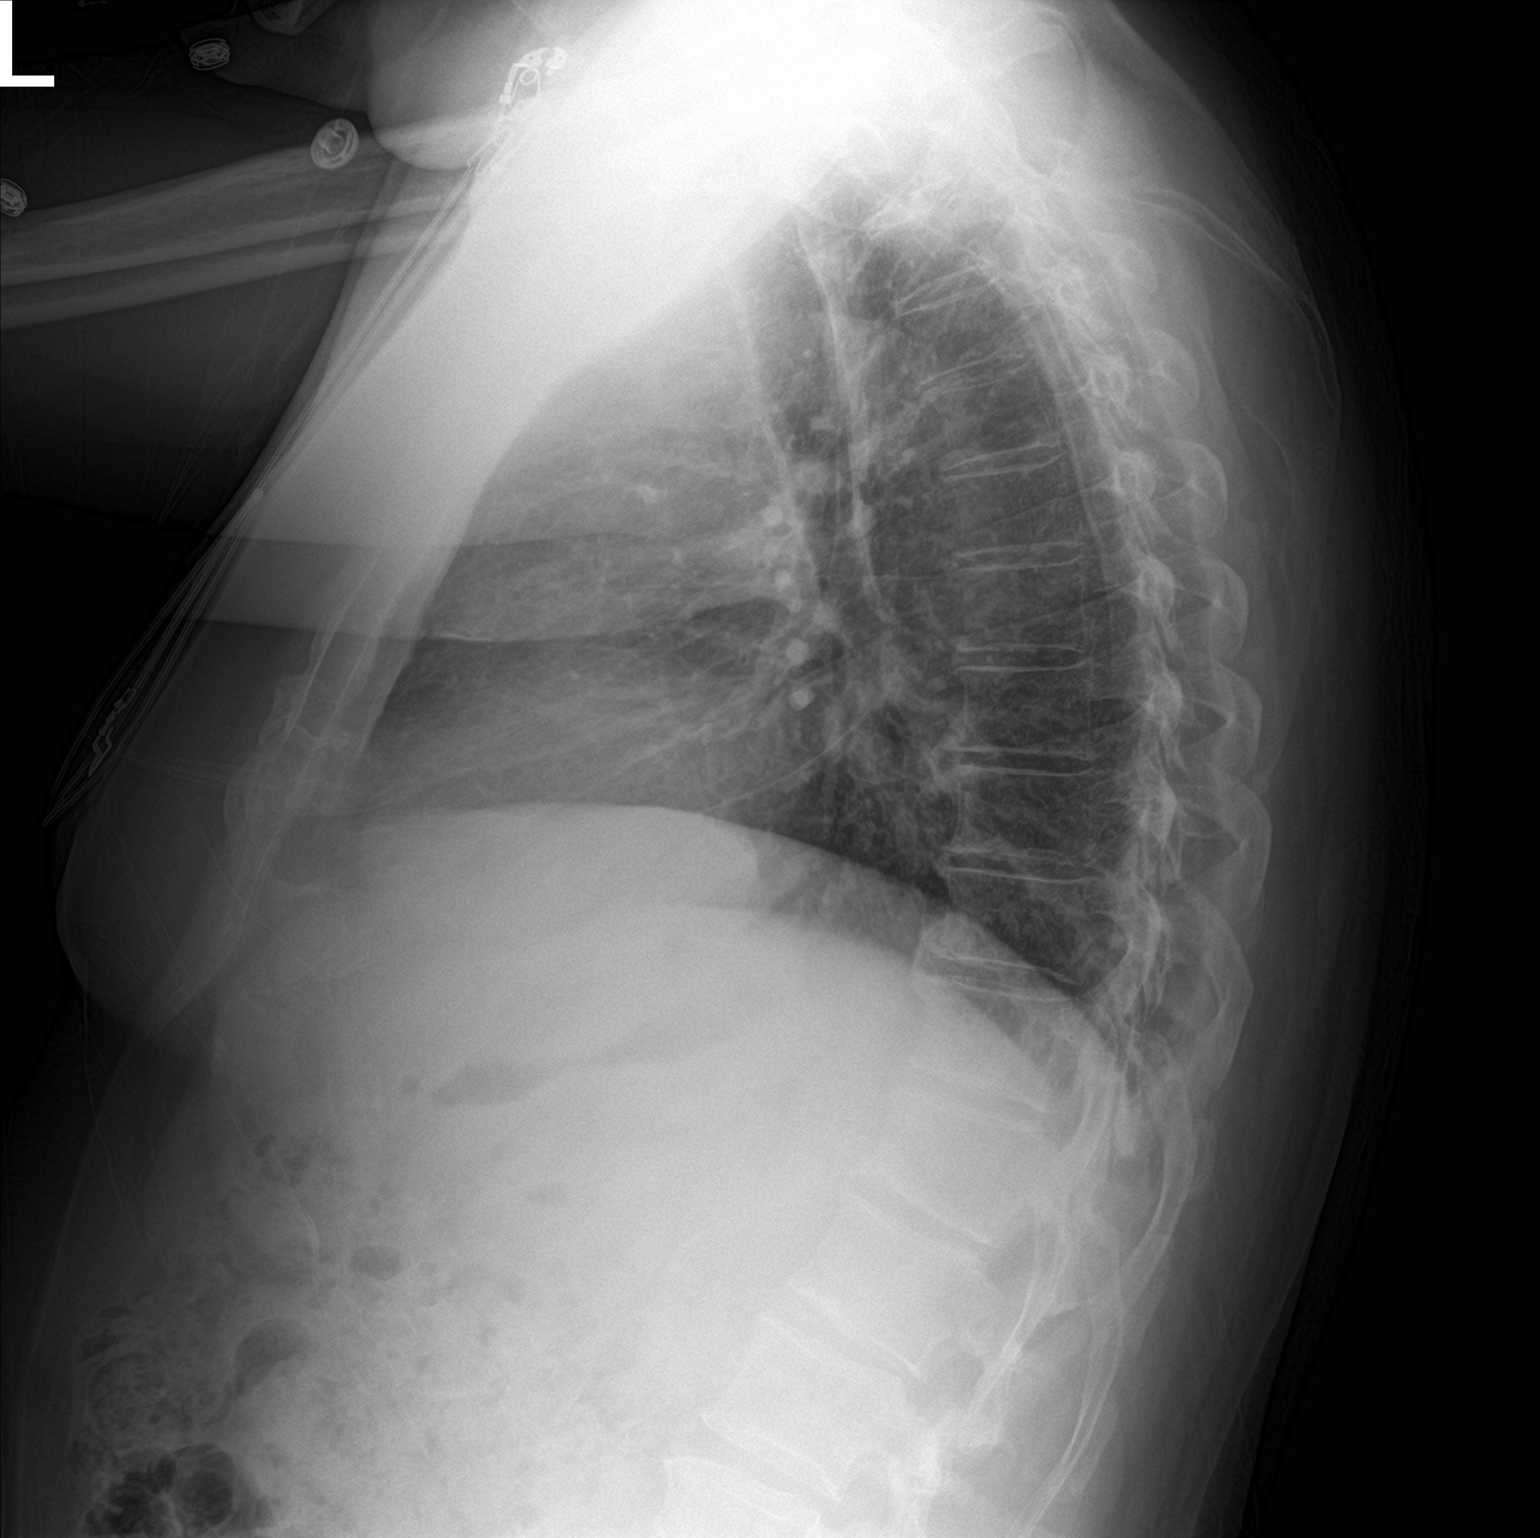

[2 of 2 positions shown; findings below may reference images not displayed]

FINDINGS: The heart size and mediastinal contours are within normal limits.
Both lungs are clear. The visualized skeletal structures are
unremarkable.
IMPRESSION: No active cardiopulmonary disease.

## 2023-05-29 NOTE — Telephone Encounter (Signed)
Called to say unable to provide that service for the patient. Please advise

## 2023-05-29 NOTE — Telephone Encounter (Signed)
Order sent to [facility] for second time today 05/29/23.

## 2023-06-04 LAB — HM DIABETES EYE EXAM

## 2023-06-05 ENCOUNTER — Encounter: Payer: Self-pay | Admitting: Family Medicine

## 2023-06-05 ENCOUNTER — Ambulatory Visit: Payer: 59 | Admitting: Family Medicine

## 2023-06-05 VITALS — BP 100/62 | HR 67 | Temp 98.2°F | Ht 68.0 in | Wt 201.2 lb

## 2023-06-05 DIAGNOSIS — D126 Benign neoplasm of colon, unspecified: Secondary | ICD-10-CM

## 2023-06-05 DIAGNOSIS — E1140 Type 2 diabetes mellitus with diabetic neuropathy, unspecified: Secondary | ICD-10-CM | POA: Diagnosis not present

## 2023-06-05 DIAGNOSIS — F4322 Adjustment disorder with anxiety: Secondary | ICD-10-CM

## 2023-06-05 DIAGNOSIS — Z7985 Long-term (current) use of injectable non-insulin antidiabetic drugs: Secondary | ICD-10-CM

## 2023-06-05 DIAGNOSIS — I2510 Atherosclerotic heart disease of native coronary artery without angina pectoris: Secondary | ICD-10-CM | POA: Diagnosis not present

## 2023-06-05 DIAGNOSIS — G4733 Obstructive sleep apnea (adult) (pediatric): Secondary | ICD-10-CM

## 2023-06-05 LAB — POCT GLYCOSYLATED HEMOGLOBIN (HGB A1C): Hemoglobin A1C: 6.2 % — AB (ref 4.0–5.6)

## 2023-06-05 MED ORDER — OZEMPIC (0.25 OR 0.5 MG/DOSE) 2 MG/3ML ~~LOC~~ SOPN
0.5000 mg | PEN_INJECTOR | SUBCUTANEOUS | 5 refills | Status: DC
Start: 2023-06-05 — End: 2023-06-09

## 2023-06-05 MED ORDER — ALPRAZOLAM 0.5 MG PO TABS: 0.5000 mg | ORAL_TABLET | Freq: Every day | ORAL | 0 refills | Status: DC | PRN

## 2023-06-05 NOTE — Progress Notes (Signed)
He is  Subjective  CC:  Chief Complaint  Patient presents with   Diabetes    HPI: Barry Schmidt is a 62 y.o. male who presents to the office today for follow up of diabetes and problems listed above in the chief complaint.  Diabetes follow up: His diabetic control is reported as Improved.  Now on semaglutide 0.5 mg weekly.  Has tolerated very well.  Weight is down about 9 pounds.  No adverse effects.  Continues on Farxiga 10 mg daily and metformin 1000 twice daily.  He denies exertional CP or SOB or symptomatic hypoglycemia. He denies foot sores or paresthesias.  Stress reaction: Continues to have symptoms related to his stressors.  Toxic work environment.  Struggling to maintain composure at work at times.  Also irritable and his wife is noticing his irritable attitude at the home.  He is on Zoloft 200 mg daily.  Zoloft has been helpful to him but he feels like it is no longer working.  He would like a medicine to help him feel better.  He denies true panic attacks.  Mildly depressed given that he has to work in this environment for 2-1/2 more years.   Cardiac disease is stable. Reviewed recent neurologic evaluation: Doing well now on CPAP.  Feels a bit less tired but otherwise no change in symptoms. Wt Readings from Last 3 Encounters:  06/05/23 201 lb 3.2 oz (91.3 kg)  05/21/23 203 lb (92.1 kg)  04/28/23 203 lb (92.1 kg)    BP Readings from Last 3 Encounters:  06/05/23 100/62  05/21/23 (!) 99/54  02/24/23 118/64    Assessment  1. Controlled type 2 diabetes mellitus with neuropathy (HCC)   2. Tubular adenoma of colon   3. Long-term (current) use of injectable non-insulin antidiabetic drugs   4. Coronary artery disease involving native coronary artery of native heart without angina pectoris   5. OSA (obstructive sleep apnea)   6. Adjustment reaction with anxiety      Plan  Diabetes is currently very well controlled.  Continue current medications.  Continue Ozempic 0.5 mg weekly  and recheck in 6 months. Tubular adenoma reviewed colonoscopy report.  Recheck 7 years. CAD is stable. Sleep apnea is improved on CPAP Had long discussion regarding adjustment reaction and anxiety.  Continue Zoloft 200 mg and recommend counseling to better manage toxic work environment.  Can consider changing to different SSRI if cannot get improvement with behavioral management.  Did prescribe Xanax low-dose to use only as needed if stress becomes unmanageable at work.  He has used this in the past before.  No adverse effects.  Understands risk versus benefits.  Follow up: January for complete physical Orders Placed This Encounter  Procedures   POCT HgB A1C   Meds ordered this encounter  Medications   OZEMPIC, 0.25 OR 0.5 MG/DOSE, 2 MG/3ML SOPN    Sig: Inject 0.5 mg into the skin once a week.    Dispense:  3 mL    Refill:  5   ALPRAZolam (XANAX) 0.5 MG tablet    Sig: Take 1 tablet (0.5 mg total) by mouth daily as needed for anxiety.    Dispense:  20 tablet    Refill:  0      Immunization History  Administered Date(s) Administered   Influenza,inj,Quad PF,6+ Mos 06/24/2018, 06/03/2019, 06/25/2021   Janssen (J&J) SARS-COV-2 Vaccination 02/29/2020   PNEUMOCOCCAL CONJUGATE-20 10/01/2021   Pneumococcal Polysaccharide-23 10/06/2010   Tdap 10/06/2012, 10/29/2022   Zoster Recombinant(Shingrix) 10/01/2021, 04/02/2022  Diabetes Related Lab Review: Lab Results  Component Value Date   HGBA1C 6.2 (A) 06/05/2023   HGBA1C 7.2 (A) 02/24/2023   HGBA1C 7.2 (A) 10/29/2022    Lab Results  Component Value Date   MICROALBUR <0.7 10/29/2022   Lab Results  Component Value Date   CREATININE 0.90 11/07/2022   BUN 22 11/07/2022   NA 143 11/07/2022   K 4.6 11/07/2022   CL 104 11/07/2022   CO2 24 11/07/2022   Lab Results  Component Value Date   CHOL 98 10/29/2022   CHOL 88 10/01/2021   CHOL 110 04/02/2021   Lab Results  Component Value Date   HDL 52.20 10/29/2022   HDL 49.30  10/01/2021   HDL 51.10 04/02/2021   Lab Results  Component Value Date   LDLCALC 34 10/29/2022   LDLCALC 27 10/01/2021   LDLCALC 42 04/02/2021   Lab Results  Component Value Date   TRIG 58.0 10/29/2022   TRIG 60.0 10/01/2021   TRIG 87.0 04/02/2021   Lab Results  Component Value Date   CHOLHDL 2 10/29/2022   CHOLHDL 2 10/01/2021   CHOLHDL 2 04/02/2021   Lab Results  Component Value Date   LDLDIRECT 90.0 06/23/2018   The ASCVD Risk score (Arnett DK, et al., 2019) failed to calculate for the following reasons:   The valid total cholesterol range is 130 to 320 mg/dL I have reviewed the PMH, Fam and Soc history. Patient Active Problem List   Diagnosis Date Noted Date Diagnosed   Tubular adenoma of colon 06/05/2023     Priority: High    Colonoscopy 05/2023: polyp x 2. Recall in 7 years. Dr. Beverely Low sleep apnea 09/03/2022     Priority: High   OSA (obstructive sleep apnea) 09/16/2021     Priority: High    Central and obstructive: sleep study 09/2021; cpap started. Treated with bipap. Avoid supine position    S/P CABG x 3, 04/2021      Priority: High   CAD (coronary artery disease), native coronary artery 04/24/2021     Priority: High   Former smoker 04/02/2021     Priority: High    Quit 11/2019    Controlled type 2 diabetes mellitus with neuropathy (HCC) 10/14/2018     Priority: High    Diagnosed in 2007; was morbidly obese at that time 260 pounds. He lost weight and did well.  Negative urine MAC 07/2018-2024; not on ACE.  CAD, neuropathy are complications Met/farxiga and started ozempic 01/2023    Combined hyperlipidemia associated with type 2 diabetes mellitus (HCC) 10/14/2018     Priority: High   Adjustment reaction with anxiety 10/01/2021     Priority: Medium    Adhesive capsulitis of left shoulder 01/19/2020     Priority: Medium    Femoral neuropathy of right lower extremity 10/14/2018     Priority: Medium    Urticaria 01/01/2022     Priority: Low     Social History: Patient  reports that he quit smoking about 3 years ago. His smoking use included cigarettes. He has never used smokeless tobacco. He reports current alcohol use. He reports that he does not use drugs.  Review of Systems: Ophthalmic: negative for eye pain, loss of vision or double vision Cardiovascular: negative for chest pain Respiratory: negative for SOB or persistent cough Gastrointestinal: negative for abdominal pain Genitourinary: negative for dysuria or gross hematuria MSK: negative for foot lesions Neurologic: negative for weakness or gait disturbance  Objective  Vitals: BP 100/62   Pulse 67   Temp 98.2 F (36.8 C)   Ht 5\' 8"  (1.727 m)   Wt 201 lb 3.2 oz (91.3 kg)   SpO2 95%   BMI 30.59 kg/m  General: well appearing, no acute distress  Psych:  Alert and oriented, normal mood and affect HEENT:  Normocephalic, atraumatic, moist mucous membranes, supple neck  Cardiovascular:  Nl S1 and S2, RRR without murmur, gallop or rub. no edema    Diabetic education: ongoing education regarding chronic disease management for diabetes was given today. We continue to reinforce the ABC's of diabetic management: A1c (<7 or 8 dependent upon patient), tight blood pressure control, and cholesterol management with goal LDL < 100 minimally. We discuss diet strategies, exercise recommendations, medication options and possible side effects. At each visit, we review recommended immunizations and preventive care recommendations for diabetics and stress that good diabetic control can prevent other problems. See below for this patient's data.   Commons side effects, risks, benefits, and alternatives for medications and treatment plan prescribed today were discussed, and the patient expressed understanding of the given instructions. Patient is instructed to call or message via MyChart if he/she has any questions or concerns regarding our treatment plan. No barriers to understanding were  identified. We discussed Red Flag symptoms and signs in detail. Patient expressed understanding regarding what to do in case of urgent or emergency type symptoms.  Medication list was reconciled, printed and provided to the patient in AVS. Patient instructions and summary information was reviewed with the patient as documented in the AVS. This note was prepared with assistance of Dragon voice recognition software. Occasional wrong-word or sound-a-like substitutions may have occurred due to the inherent limitations of voice recognition software

## 2023-06-05 NOTE — Patient Instructions (Signed)
Please return in January for your annual complete physical; please come fasting.   Please call [facility] to schedule an appointment with Dr. Monna Fam; she is a therapist here at our [place] office.  The phone number is: [phone] We can consider changing your zoloft to a different antidepressant/anti anxiety medication if you and the therapist are not finding improvement with counseling.   I have ordered xanax to use IF needed for stress management.  If you have any questions or concerns, please don't hesitate to send me a message via MyChart or call the office at [phone]. Thank you for visiting with [name] today! It's our pleasure caring for you.   Stress, Adult Stress is a normal reaction to life events. Stress is what you feel when life demands more than you are used to, or more than you think you can handle. Some stress can be useful, such as studying for a test or meeting a deadline at work. Stress that occurs too often or for too long can cause problems. Long-lasting stress is called chronic stress. Chronic stress can affect your emotional health and interfere with relationships and normal daily activities. Too much stress can weaken your body's defense system (immune system) and increase your risk for physical illness. If you already have a medical problem, stress can make it worse. What are the causes? All sorts of life events can cause stress. An event that causes stress for one person may not be stressful for someone else. Major life events, whether positive or negative, commonly cause stress. Examples include: Losing a job or starting a new job. Losing a loved one. Moving to a new town or home. Getting married or divorced. Having a baby. Getting injured or sick. Less obvious life events can also cause stress, especially if they occur day after day or in combination with each other. Examples include: Working long hours. Driving in traffic. Caring  for children. Being in debt. Being in a difficult relationship. What are the signs or symptoms? Stress can cause emotional and physical symptoms and can lead to unhealthy behaviors. These include the following: Emotional symptoms Anxiety. This is feeling worried, afraid, on edge, overwhelmed, or out of control. Anger, including irritation or impatience. Depression. This is feeling sad, down, helpless, or guilty. Trouble focusing, remembering, or making decisions. Physical symptoms Aches and pains. These may affect your head, neck, back, stomach, or other areas of your body. Tight muscles or a clenched jaw. Low energy. Trouble sleeping. Unhealthy behaviors Eating to feel better (overeating) or skipping meals. Working too much or putting off tasks. Smoking, drinking alcohol, or using drugs to feel better. How is this diagnosed? A stress disorder is diagnosed through an assessment by your health care provider. A stress disorder may be diagnosed based on: Your symptoms and any stressful life events. Your medical history. Tests to rule out other causes of your symptoms. Depending on your condition, your health care provider may refer you to a specialist for further evaluation. How is this treated?  Stress management techniques are the recommended treatment for stress. Medicine is not typically recommended for treating stress. Techniques to reduce your reaction to stressful life events include: Identifying stress. Monitor yourself for symptoms of stress and notice what causes stress for you. These skills may help you to avoid or prepare for stressful events. Managing time. Set your priorities, keep a calendar of events, and learn to say no. These actions can help you avoid taking on too much. Techniques  for dealing with stress include: Rethinking the problem. Try to think realistically about stressful events rather than ignoring them or overreacting. Try to find the positives in a stressful  situation rather than focusing on the negatives. Exercise. Physical exercise can release both physical and emotional tension. The key is to find a form of exercise that you enjoy and do it regularly. Relaxation techniques. These relax the body and mind. Find one or more that you enjoy and use the techniques regularly. Examples include: Meditation, deep breathing, or progressive relaxation techniques. Yoga or tai chi. Biofeedback, mindfulness techniques, or journaling. Listening to music, being in nature, or taking part in other hobbies. Practicing a healthy lifestyle. Eat a balanced diet, drink plenty of water, limit or avoid caffeine, and get plenty of sleep. Having a strong support network. Spend time with family, friends, or other people you enjoy being around. Express your feelings and talk things over with someone you trust. Counseling or talk therapy with a mental health provider may help if you are having trouble managing stress by yourself. Follow these instructions at home: Lifestyle  Avoid drugs. Do not use any products that contain nicotine or tobacco. These products include cigarettes, chewing tobacco, and vaping devices, such as e-cigarettes. If you need help quitting, ask your health care provider. If you drink alcohol: Limit how much you have to: 0-1 drink a day for women who are not pregnant. 0-2 drinks a day for men. Know how much alcohol is in a drink. In the [place] one drink equals one 12 oz bottle of beer (355 mL), one 5 oz glass of wine (148 mL), or one 1 oz glass of hard liquor (44 mL). Do not use alcohol or drugs to relax. Eat a balanced diet that includes fresh fruits and vegetables, whole grains, lean meats, fish, eggs, beans, and low-fat dairy. Avoid processed foods and foods high in added fat, sugar, and salt. Exercise at least 30 minutes on 5 or more days each week. Get 7-8 hours of sleep each night. General instructions  Practice stress management techniques as  told by your health care provider. Drink enough fluid to keep your urine pale yellow. Take over-the-counter and prescription medicines only as told by your health care provider. Keep all follow-up visits. This is important. Contact a health care provider if: Your symptoms get worse. You have new symptoms. You feel overwhelmed by your problems and can no longer manage them by yourself. Get help right away if: You have thoughts of hurting yourself or others. Get help right awayif you feel like you may hurt yourself or others, or have thoughts about taking your own life. Go to your nearest emergency room or: Call 911. Call the National Suicide Prevention Lifeline at 682-435-7962 or 988. This is open 24 hours a day. Text the Crisis Text Line at 504-499-3942. Summary Stress is a normal reaction to life events. It can cause problems if it happens too often or for too long. Practicing stress management techniques is the best way to treat stress. Counseling or talk therapy with a mental health provider may help if you are having trouble managing stress by yourself. This information is not intended to replace advice given to you by your health care provider. Make sure you discuss any questions you have with your health care provider. Document Revised: 05/02/2021 Document Reviewed: 05/02/2021 Elsevier Patient Education  2024 [employer].

## 2023-06-09 ENCOUNTER — Other Ambulatory Visit: Payer: Self-pay

## 2023-06-09 ENCOUNTER — Telehealth: Payer: Self-pay | Admitting: Family Medicine

## 2023-06-09 DIAGNOSIS — E1140 Type 2 diabetes mellitus with diabetic neuropathy, unspecified: Secondary | ICD-10-CM

## 2023-06-09 MED ORDER — OZEMPIC (0.25 OR 0.5 MG/DOSE) 2 MG/3ML ~~LOC~~ SOPN
0.5000 mg | PEN_INJECTOR | SUBCUTANEOUS | 2 refills | Status: DC
Start: 2023-06-09 — End: 2023-11-12

## 2023-06-09 NOTE — Telephone Encounter (Signed)
Pt states rx: Ozempic was written for 30 days & states is needing it for 90 days. Please advise

## 2023-06-25 ENCOUNTER — Telehealth: Payer: Self-pay | Admitting: Cardiovascular Disease

## 2023-06-25 NOTE — Telephone Encounter (Signed)
Patient calls for appt.  He was to F/U 3-4 months from 10/23.  Offered an appt for October 24 and states he does not know if he can do this.  Looking to November and he puts me on hold.  Held until call came in. He will need to schedule with scheduling

## 2023-06-25 NOTE — Telephone Encounter (Signed)
Patient wants to know when he should schedule if f/u visit with Dr. Tresa Endo since he received his new sleep equipment.

## 2023-07-06 IMAGING — CR DG CHEST 2V
2 series · 2 of 2 positions shown · non-contrast
Comparison: Prior chest x-ray 04/27/2021

CLINICAL DATA: Status post CABG

EXAM:
CHEST - 2 VIEW

[w chest pa]
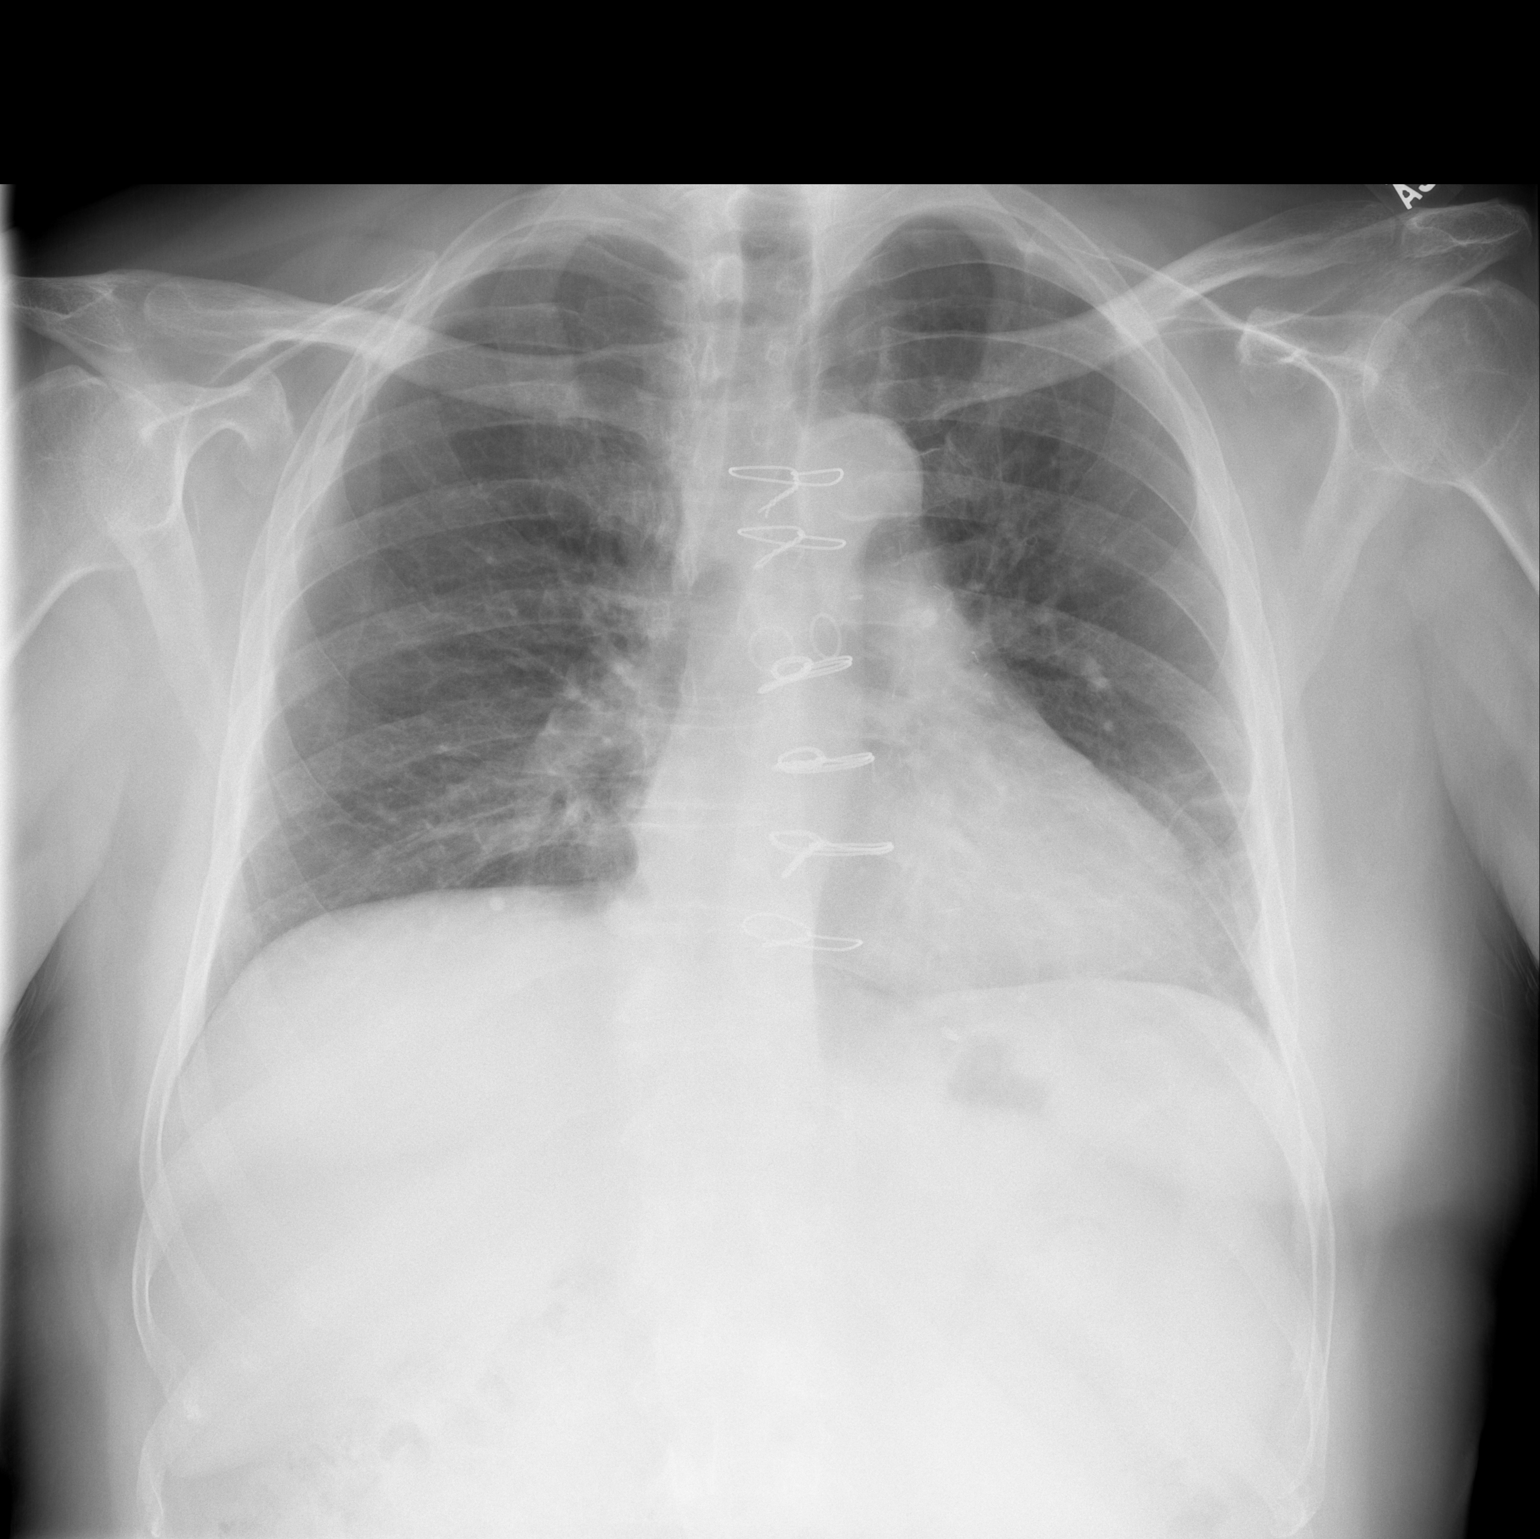

[w chest lat *]
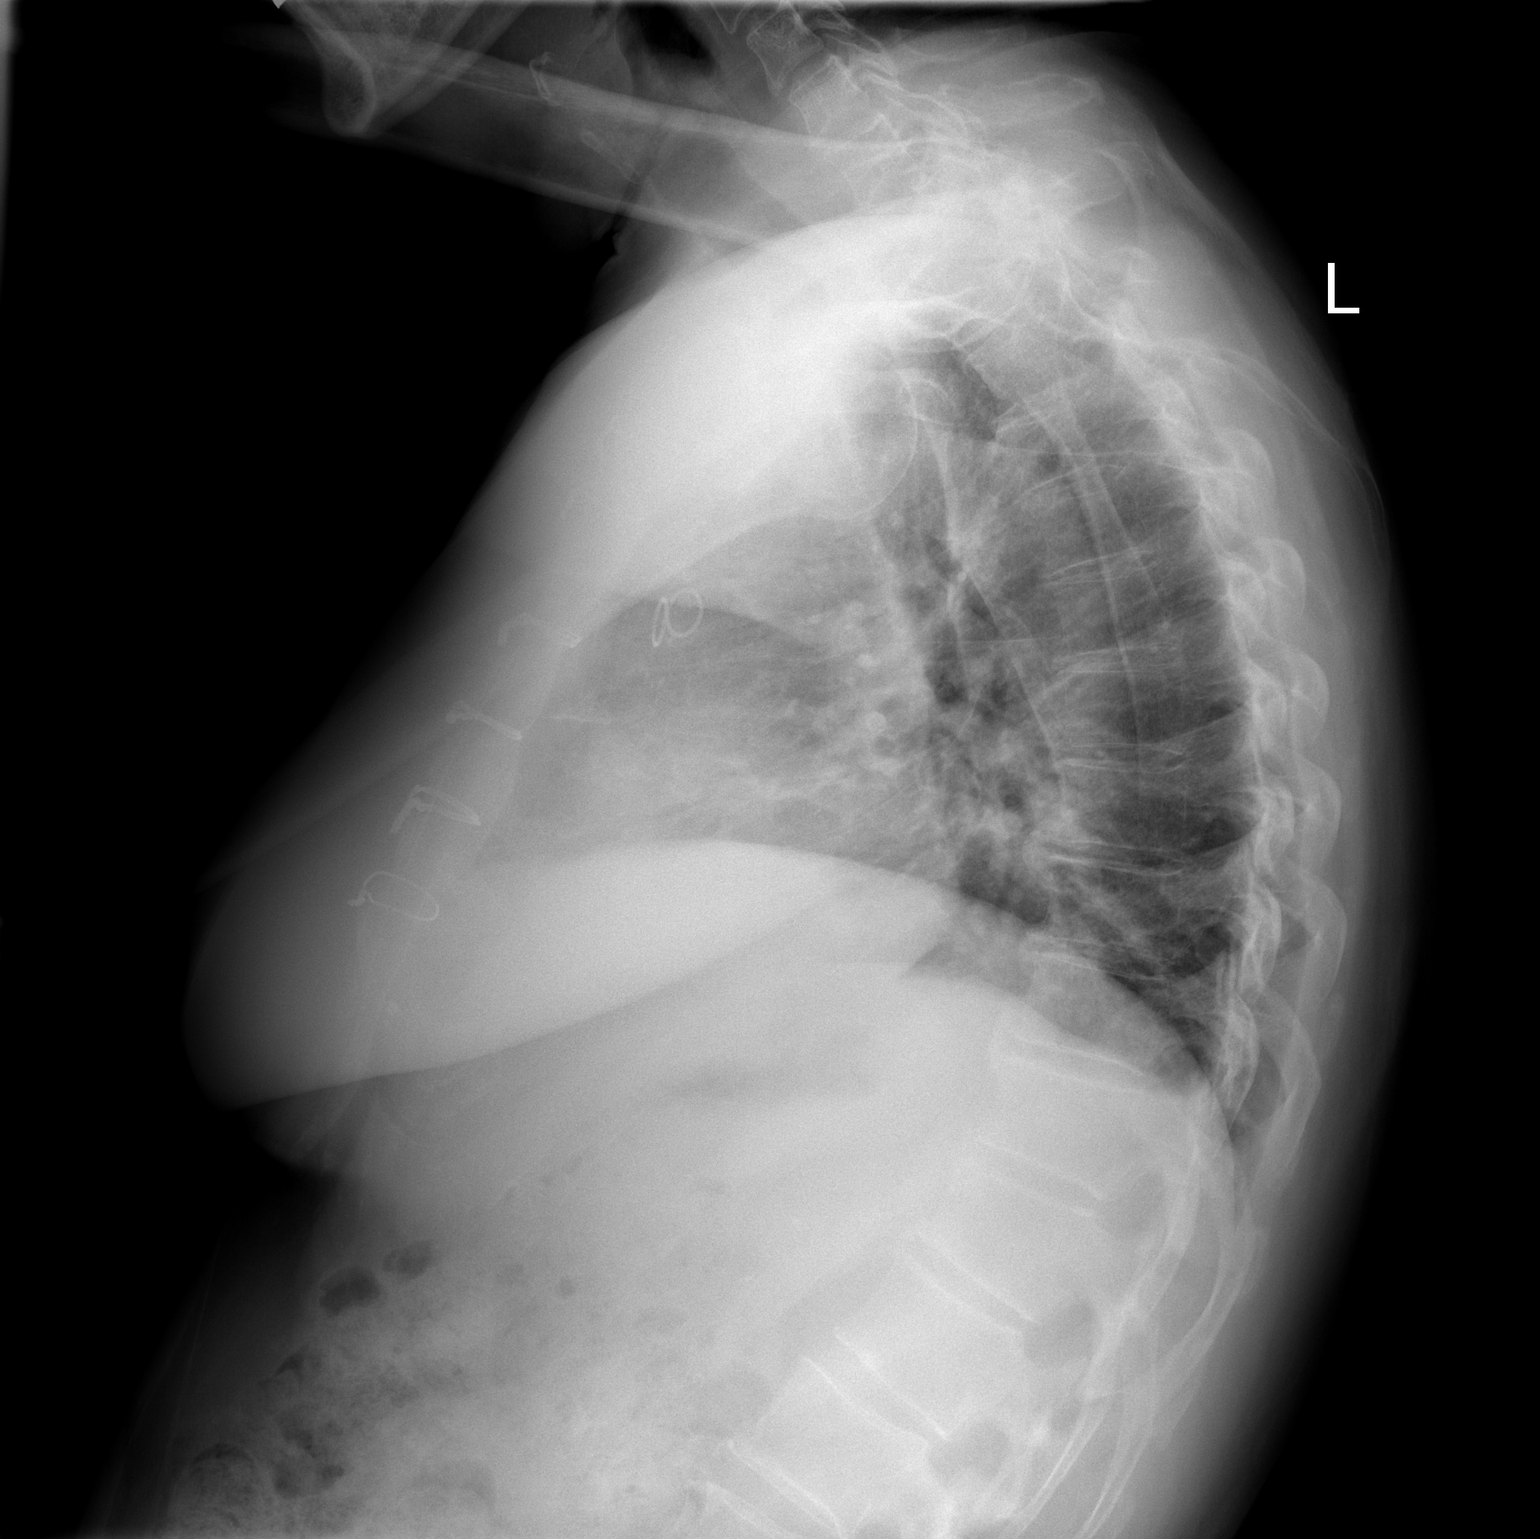

[2 of 2 positions shown; findings below may reference images not displayed]

FINDINGS: Surgical changes of prior median sternotomy for multivessel CABG.
The lungs are clear. No pulmonary edema, pleural effusion or
pneumothorax. Significantly improved aeration compared to the prior
chest x-ray.
IMPRESSION: No active cardiopulmonary disease.

## 2023-07-08 ENCOUNTER — Other Ambulatory Visit: Payer: Self-pay | Admitting: Cardiovascular Disease

## 2023-07-30 ENCOUNTER — Ambulatory Visit: Payer: 59 | Attending: Cardiovascular Disease | Admitting: Cardiovascular Disease

## 2023-07-30 ENCOUNTER — Encounter: Payer: Self-pay | Admitting: Cardiovascular Disease

## 2023-07-30 DIAGNOSIS — I10 Essential (primary) hypertension: Secondary | ICD-10-CM | POA: Diagnosis not present

## 2023-07-30 DIAGNOSIS — G4733 Obstructive sleep apnea (adult) (pediatric): Secondary | ICD-10-CM

## 2023-07-30 DIAGNOSIS — E785 Hyperlipidemia, unspecified: Secondary | ICD-10-CM

## 2023-07-30 DIAGNOSIS — I2510 Atherosclerotic heart disease of native coronary artery without angina pectoris: Secondary | ICD-10-CM

## 2023-07-30 DIAGNOSIS — Z951 Presence of aortocoronary bypass graft: Secondary | ICD-10-CM | POA: Diagnosis not present

## 2023-07-30 MED ORDER — NITROGLYCERIN 0.4 MG SL SUBL: 0.4000 mg | SUBLINGUAL_TABLET | SUBLINGUAL | 6 refills | Status: AC | PRN

## 2023-07-30 NOTE — Progress Notes (Signed)
Cardiology Office Note    Date:  08/05/2023   ID:  Barry Schmidt, DOB 28-Oct-1960, MRN [id number]  PCP:  Barry Ora, MD  Cardiologist:  Barry Guadalajara, MD   8 month follow-up cardiology and sleep evaluation initially referred through the courtesy of Dr. Asencion Schmidt for evaluation of chest tightness   History of Present Illness:  Barry Schmidt is a 62 y.o. male who is originally from [facility].  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  He had  developed chest tightness particularly while lifting objects and is referred by Dr. Mardelle Schmidt for cardiology consultation.  I saw him for my initial evaluation on April 04, 2021.   Barry Schmidt has remote tobacco history and had smoked off and on for 12 years but quit in February 2019.  He has a history of diabetes mellitus for at least 10 years.  He states that he had had episodes of vertigo and staggering shortly following being vaccinated for COVID.  He underwent MRI of his head.  He states that 1 year ago he started to notice more shortness of breath when wearing a mask.  Recently, he has experienced progressive shortness of breath with activity and chest tightness when he is walking and carrying things.  He recently attended [employer] and developed significant chest tightness while walking on his infield carrying a cooler.  He describes a tightness as a pressure at the weight sitting on his chest.  His symptoms dissipate with rest.  He was evaluated by Dr. Mardelle Schmidt on April 02, 2021 and due to concern for angina pectoris cardiology consultation was requested.   He has recently started to see a retina specialist for his left eye.  He has a history of hyperlipidemia and was recently started on rosuvastatin.  He has been on Farxiga and metformin for his diabetes mellitus.  He also has a history for neuropathy.  During his initial evaluation with me, I was concerned with his symptoms  most likely representing progressive angina pectoris.  We discussed multiple scenarios of evaluation and after much discussion definitive cardiac catheterization was recommended.  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  There was 50% narrowing in the mid circumflex vessel after marginal branch.  He had significant dampening with each injection into the RCA and had 70% proximal stenosis with distal irregularity.  There was extensive collateralization from the distal RCA via septal collaterals supplying the LAD.  Due to his high-grade complex anatomy CABG revascularization was recommended.  He underwent successful CABG revascularization on April 25, 2020 by Dr. Dorris Fetch with LIMA to the LAD, radial artery to OM 2, and SVG to PDA.  He was discharged on May 01, 2021.  Subsequently, he has been evaluated by Barry Fabian, NP  and Barry Reining, NP in our office.  He had issues with lower extremity edema.  He has had issues with anxiety and stress and panic attacks.   Due to concerns for sleep apnea he was referred for a sleep study which was done at [facility] sleep disorder center from August 23, 2021.  He was found to have moderate overall sleep apnea with AHI of 17.7; no events were severe with supine sleep AHI 56.2/h.  There was loud  snoring.  Following his diagnostic study he also was noted to have central apneic events with a central apnea index of 9.7.  As result, in lab CPAP titration was recommended with potential need for BiPAP or ASV titration if necessary depending upon LV function.    When I saw him on October 10, 2021 he denied any chest pain.  He denies any palpitations.  He is back and working Designer, multimedia.  He has had issues with his left shoulder when he lifts his left arm.  He does  notice occasional left lower extremity edema.  He is now on furosemide 20 mg on an as-needed basis, Toprol tartrate 25 mg twice a day.  He is on [place] and metformin for his diabetes.  He is on sertraline and alprazolam for anxiety.   I saw him on April 02, 2022.  He underwent a CPAP titration study on January 08, 2022.  He was titrated to 13 cm of water where AHI was 0 and O2 nadir 95%.  He was recommended that he initiate a trial of CPAP auto therapy with EPR of 3 at 12 to 16 cm.  He received a new ResMed AirSense 11 AutoSet unit on January 28, 2022.  A download was obtained in the office today from April 25 through Feb 26, 2022 which showed excellent compliance with 100% use and average use at 9 hours and 28 minutes.  At his 12 to 16 cm pressure range AHI was 6.5 and a central apnea index was 5.5.  His 95th percentile pressure was 13.2.  Presently, he has felt well without recurrent chest pain.  He notes his heart rate decreases and can drop into the 40s when he is sleeping.  He currently is taking metformin 1000 mg twice a day, Farxiga 10 mg, furosemide as needed 20 mg, metoprolol tartrate 25 mg twice a day.  He is on rosuvastatin 40 mg daily and aspirin.  He continues to take Zoloft.  During that evaluation, he was bradycardic and metoprolol dose was slightly reduced.  I was concerned about a central apnea index of 5.5 and recommended he decrease his start pressure and to have a range of 10 to 16 cm of water.  I discussed potential future need for possible BiPAP or ASV titration depending upon subsequent central events.  I  saw him on July 25, 2022.  Since his prior evaluation he was continuingd to use CPAP at his pressure range of 10 to 16 cm of water.  His 95th percentile pressure is 12.0 with maximum average pressure 13.2.  AHI is 8.1 but his central apnea index is 7.0 with obstructive apnea index at 0.4.  He denied any chest tightness or pressure but occasionally experiences some chest tingling which is  nonexertional.  He continues to be on a medical regimen of Farxiga 10 mg and metformin 1000 mg for his diabetes mellitus.  He is now on metoprolol tartrate 12.5 mg twice a day and continues to be on rosuvastatin 40 mg daily for hyper lipidemia.  He is on sertraline.  An Epworth Sleepiness Scale score was recalculated in the office today and this endorsed at 10 suggestive of borderline residual daytime sleepiness.    Due to continued central events on subsequent downloads, he was referred for a BiPAP titration study which was done on September 03, 2022.  BiPAP was initiated 8/4 and was titrated to 11 over 7 cm of water.  He subsequently received a BiPAP machine on October 02, 2022.  Initially was on a spontaneous mode with his download showing significant central events he was changed by Dr. Mayford Knife to an auto mode when I was away for the week on vacation.  Patient will downloads with the auto mode has continued to show significant central events despite adjustment of his pressure support.  Most recent downloads have demonstrated central events of 16.2 and 20.3 with his 95th percentile pressure 13.8/7.3.  I last saw him on November 07, 2022. He has been trying to sleep at least 9 hours per night.  He denies any increasing shortness of breath or awareness of lower extremity edema or CHF symptoms.  He continues to be on Farxiga 10 mg and metformin for his diabetes.  He has been taking metoprolol to tartrate 12.5 mg twice a day.  He is on rosuvastatin 40 mg at bedtime and takes a baby aspirin.  He has not been taking furosemide 20 mg but only takes this when needed typically may be every third week.   Since I last saw him, due to continued central events I recommended he undergo an ASV titration study was ultimately done on April 06, 2023.  He tolerated ASV exceptionally well and it was recommended that he initiate a trial of ASV.  Deceived a new ResMed air curve 11 ASV unit with set up date on May 06, 2023.  His CPAP  setting is at 7 cm with his minimum pressure support setting at 3 with maximum pressure support at 15.  On a download from June 28, 2023 to July 27, 2023 compliance is excellent with 100% use.  He is averaging 9 hours and 8 minutes a night.  With ASV therapy he essentially has no events.  AHI was excellent at 0.6.  There is no mask leak.  He believes he is sleeping much better.  He typically goes to bed between 8 and 9 PM and wakes up at 5 AM.  He has been using a fullface mask.  [employer] is his new DME company.  He presents for evaluation.   Past Medical History:  Diagnosis Date   Diabetes mellitus without complication (HCC)    Hyperlipidemia    Hypertension    On statin therapy due to risk of future cardiovascular event 10/14/2018   OSA (obstructive sleep apnea) 09/16/2021   Central and obstructive: sleep study 09/2021; cpap started. May warrant bipap. Avoid supine position   S/P CABG x 3, 04/2021    Sleep apnea    Uncontrolled diabetes mellitus without complication, with long-term current use of insulin 06/23/2018   Diagnosed in 2007; was morbidly obese at that time 260 pounds. He lost weight and did well.      Past Surgical History:  Procedure Laterality Date   COLONOSCOPY  05/21/2023   Amada Jupiter at [facility].  also in 2014- in [place] per pt   CORONARY ARTERY BYPASS GRAFT N/A 04/25/2021   Procedure: CORONARY ARTERY BYPASS GRAFTING (CABG)x 3 ON CARDIOPULMONARY BYPASS USING LIMA, LEFT RADIAL ARTERY AND  GSV.;  Surgeon: Loreli Slot, MD;  Location: [facility] OR;  Service: Open Heart Surgery;  Laterality: N/A;   ENDOVEIN HARVEST OF GREATER SAPHENOUS VEIN Bilateral 04/25/2021   Procedure: ENDOVEIN HARVEST OF GREATER SAPHENOUS VEIN;  Surgeon: Loreli Slot, MD;  Location[facility] OR;  Service: Open Heart Surgery;  Laterality: Bilateral;   LEFT HEART CATH AND CORONARY ANGIOGRAPHY N/A 04/24/2021   Procedure: LEFT HEART CATH AND CORONARY ANGIOGRAPHY;  Surgeon: Lennette Bihari, MD;  Location: [facility] INVASIVE CV LAB;  Service: Cardiovascular;  Laterality: N/A;   RADIAL ARTERY HARVEST Left 04/25/2021   Procedure: RADIAL ARTERY HARVEST;  Surgeon: Loreli Slot, MD;  Location: [facility] OR;  Service: Open Heart Surgery;  Laterality: Left;   TEE WITHOUT CARDIOVERSION N/A 04/25/2021   Procedure: TRANSESOPHAGEAL ECHOCARDIOGRAM (TEE);  Surgeon: Loreli Slot, MD;  Location: [facility] OR;  Service: Open Heart Surgery;  Laterality: N/A;   TONSILECTOMY/ADENOIDECTOMY WITH MYRINGOTOMY  2004   Uvilaectomy.    Current Medications: Outpatient Medications Prior to Visit  Medication Sig Dispense Refill   ACCU-CHEK AVIVA PLUS test strip USE AS INSTRUCTED TO TEST BLOOD GLUCOSE 100 strip 12   acetaminophen (TYLENOL) 325 MG tablet Take 2 tablets (650 mg total) by mouth every 6 (six) hours as needed for mild pain.     ALPRAZolam (XANAX) 0.5 MG tablet Take 1 tablet (0.5 mg total) by mouth daily as needed for anxiety. 20 tablet 0   aspirin EC 81 MG tablet Take 81 mg by mouth at bedtime.     Blood Glucose Monitoring Suppl (ONE TOUCH ULTRA 2) w/Device KIT 1 kit by Does not apply route 2 (two) times daily. 1 each 0   furosemide (LASIX) 20 MG tablet TAKE 1 TABLET BY MOUTH EVERY DAY AS NEEDED 90 tablet 3   Lancets (ONETOUCH ULTRASOFT) lancets Use as instructed 100 each 12   metFORMIN (GLUCOPHAGE) 1000 MG tablet Take 1 tablet (1,000 mg total) by mouth 2 (two) times daily with a meal. 180 tablet 3   metoprolol tartrate (LOPRESSOR) 25 MG tablet Take 1 tablet (25 mg) in the morning, and 0.5 tablet (12.5 mg) in the evening. (Patient taking differently: Take 1 tablet (12.5 mg) in the morning, and 0.5 tablet (12.5 mg) in the evening.) 135 tablet 3   OZEMPIC, 0.25 OR 0.5 MG/DOSE, 2 MG/3ML SOPN Inject 0.5 mg into the skin once a week. 9 mL 2   rosuvastatin (CRESTOR) 40 MG tablet TAKE 1 TABLET BY MOUTH EVERYDAY AT BEDTIME 90 tablet 3   sertraline (ZOLOFT) 100 MG tablet Take 2 tablets (200 mg total) by mouth  daily. 180 tablet 3   dapagliflozin propanediol (FARXIGA) 10 MG TABS tablet Take 1 tablet (10 mg total) by mouth daily. 90 tablet 3   nitroGLYCERIN (NITROSTAT) 0.4 MG SL tablet Place 1 tablet (0.4 mg total) under the tongue every 5 (five) minutes as needed for chest pain. 25 tablet 6   No facility-administered medications prior to visit.     Allergies:   Penicillins   Social History   Socioeconomic History   Marital status: Married    Spouse name: [name]   Number of children: 2   Years of education: Not on file   Highest education level: Associate degree: occupational, Scientist, product/process development, or vocational program  Occupational History   Occupation: Production designer, theatre/television/film, Airline pilot: [employer]  Tobacco Use   Smoking status: Former    Current packs/day: 0.00    Types: Cigarettes    Quit date: 11/07/2019    Years since quitting: 3.7   Smokeless tobacco: Never  Vaping Use   Vaping status: Never Used  Substance and Sexual Activity   Alcohol use: Yes    Comment: Occastional    Drug use: Never   Sexual activity: Yes    Partners: Female  Other Topics Concern   Not on file  Social History Narrative   Right handed    Caffeine 4 daily    Lives at home with wife (  [name]) and child.   Social Determinants of Health   Financial Resource Strain: Low Risk  (06/01/2023)   Overall Financial Resource Strain (CARDIA)    Difficulty of Paying Living Expenses: Not very hard  Food Insecurity: No Food Insecurity (06/01/2023)   Hunger Vital Sign    Worried About Running Out of Food in the Last Year: Never true    Ran Out of Food in the Last Year: Never true  Transportation Needs: No Transportation Needs (06/01/2023)   PRAPARE - Administrator, Civil Service (Medical): No    Lack of Transportation (Non-Medical): No  Physical Activity: Sufficiently Active (06/01/2023)   Exercise Vital Sign    Days of Exercise per Week: 5 days    Minutes of Exercise per Session: 150+ min  Stress: Stress Concern Present  (06/01/2023)   [employer] of Occupational Health - Occupational Stress Questionnaire    Feeling of Stress : Very much  Social Connections: Moderately Integrated (06/01/2023)   Social Connection and Isolation Panel [NHANES]    Frequency of Communication with Friends and Family: More than three times a week    Frequency of Social Gatherings with Friends and Family: Twice a week    Attends Religious Services: 1 to 4 times per year    Active Member of [employer] or Organizations: No    Attends Engineer, structural: Not on file    Marital Status: Married    Socially he was born in [place].  He is in his second marriage of 15 years..  His first marriage lasted 17 years.  He has 2 children ages 20 and 49 and 2 grandchildren ages 73 and 85.  He started college but never finished.  He does not routinely exercise but is states while at work he often walks up to 18,000 steps per day.   Family History:  The patient's family history includes Colon polyps in his brother, brother, brother, father, and sister; Diabetes in his brother, brother, and sister; Diabetes type I in his father; Healthy in his son and son; Heart attack in his father; Heart disease in his mother; Hypertension in his mother.   His mother is alive at age 37.  Father died at 64 and had diabetes and heart problems and was status post pacemaker insertion.  1 brother died at 81 and had suffered a myocardial infarction in his 43s.  He has 3 living brothers in their 13s and 2 sisters ages 22 and 36 both with diabetes.  ROS General: Negative; No fevers, chills, or night sweats;  HEENT: Recent left eye problem seeing a retina specialist.  No changes in hearing, sinus congestion, difficulty swallowing Pulmonary: Negative; No cough, wheezing, shortness of breath, hemoptysis Cardiovascular: See HPI GI: Negative; No nausea, vomiting, diarrhea, or abdominal pain GU: Negative; No dysuria, hematuria, or difficulty  voiding Musculoskeletal: Left shoulder discomfort Hematologic/Oncology: Negative; no easy bruising, bleeding Endocrine: Negative; no heat/cold intolerance; no diabetes Neuro: Negative; no changes in balance, headaches Skin: Negative; No rashes or skin lesions Psychiatric: Negative; No behavioral problems, depression Sleep: Positive for snoring, recently diagnosed moderate sleep apnea overall, severe during supine sleep.  Due to frequent central events on CPAP and BiPAP, he has been transition to ASV therapy  Other comprehensive 14 point system review is negative.   PHYSICAL EXAM:   VS:  BP (!) 100/58   Pulse 60   Ht 5\' 8"  (1.727 m)   Wt 201 lb (91.2 kg)   SpO2 95%  BMI 30.56 kg/m     Repeat blood pressure by me was 118/62  Wt Readings from Last 3 Encounters:  07/30/23 201 lb (91.2 kg)  06/05/23 201 lb 3.2 oz (91.3 kg)  05/21/23 203 lb (92.1 kg)   General: Alert, oriented, no distress.  Skin: normal turgor, no rashes, warm and dry HEENT: Normocephalic, atraumatic. Pupils equal round and reactive to light; sclera anicteric; extraocular muscles intact;  Nose without nasal septal hypertrophy Mouth/Parynx benign; Mallinpatti scale 3 Neck: No JVD, no carotid bruits; normal carotid upstroke Lungs: clear to ausculatation and percussion; no wheezing or rales Chest wall: without tenderness to palpitation Heart: PMI not displaced, RRR, s1 s2 normal, 1/6 systolic murmur, no diastolic murmur, no rubs, gallops, thrills, or heaves Abdomen: soft, nontender; no hepatosplenomehaly, BS+; abdominal aorta nontender and not dilated by palpation. Back: no CVA tenderness Pulses 2+ Musculoskeletal: full range of motion, normal strength, no joint deformities Extremities: no clubbing cyanosis or edema, Homan's sign negative  Neurologic: grossly nonfocal; Cranial nerves grossly wnl Psychologic: Normal mood and affect    Studies/Labs Reviewed:    EKG Interpretation Date/Time:  Thursday July 30 2023 15:26:51 EDT Ventricular Rate:  60 PR Interval:  160 QRS Duration:  100 QT Interval:  470 QTC Calculation: 470 R Axis:   -24  Text Interpretation: Normal sinus rhythm Low voltage QRS Incomplete right bundle branch block Nonspecific ST and T wave abnormality Prolonged QT When compared with ECG of 26-Apr-2021 07:13, T wave inversion now evident in Anterior leads Confirmed by Barry Schmidt ([id number]) on 08/05/2023 7:03:12 PM    July 25, 2022 ECG (independently read by me): Sinus bradycardia at 59, LAD, IRBBB  April 02, 2022 ECG (independently read by me): Sinus bradycardia at 56 bpm, left axis deviation.  Incomplete right bundle branch block  October 10, 2021 ECG (independently read by me):Sinus bradycardia at 53; IRBBB, Nonspecific T changes   April 04, 2021 ECG (independently read by me): NSR at 62, IRBBB, PRWP   Recent Labs:    Latest Ref Rng & Units 11/07/2022   12:36 PM 10/21/2022    1:40 PM 10/01/2021    9:08 AM  BMP  Glucose 70 - 99 mg/dL 130  99  865   BUN 6 - 24 mg/dL 22  24  16    Creatinine 0.76 - 1.27 mg/dL 7.84  6.96  2.95   BUN/Creat Ratio 9 - 20 24     Sodium 134 - 144 mmol/L 143  140  138   Potassium 3.5 - 5.2 mmol/L 4.6  4.1  3.9   Chloride 96 - 106 mmol/L 104  105  102   CO2 20 - 29 mmol/L 24  26  27    Calcium 8.7 - 10.2 mg/dL 9.7  9.4  9.1         Latest Ref Rng & Units 11/07/2022   12:36 PM 10/21/2022    1:40 PM 10/01/2021    9:08 AM  Hepatic Function  Total Protein 6.0 - 8.5 g/dL 7.0  7.0  7.1   Albumin 3.8 - 4.9 g/dL 4.7  4.3  4.4   AST 0 - 40 IU/L 18  20  18    ALT 0 - 44 IU/L 19  21  17    Alk Phosphatase 44 - 121 IU/L 72  56  63   Total Bilirubin 0.0 - 1.2 mg/dL 0.2  0.4  0.4        Latest Ref Rng & Units 10/21/2022    1:40  PM 10/01/2021    9:08 AM 04/27/2021    3:51 AM  CBC  WBC 4.0 - 10.5 K/uL 7.3  5.2  7.7   Hemoglobin 13.0 - 17.0 g/dL 16.1  09.6  9.5   Hematocrit 39.0 - 52.0 % 41.5  40.4  29.7   Platelets 150 - 400 K/uL 193  195.0  132     Lab Results  Component Value Date   MCV 86.8 10/21/2022   MCV 80.5 10/01/2021   MCV 89.5 04/27/2021   Lab Results  Component Value Date   TSH 3.690 04/22/2021   Lab Results  Component Value Date   HGBA1C 6.2 (A) 06/05/2023     BNP    Component Value Date/Time   BNP 34.6 11/07/2022 1236    ProBNP No results found for: "PROBNP"   Lipid Panel     Component Value Date/Time   CHOL 98 10/29/2022 0940   TRIG 58.0 10/29/2022 0940   HDL 52.20 10/29/2022 0940   CHOLHDL 2 10/29/2022 0940   VLDL 11.6 10/29/2022 0940   LDLCALC 34 10/29/2022 0940   LDLDIRECT 90.0 06/23/2018 1113     RADIOLOGY: No results found.   Additional studies/ records that were reviewed today include:  I reviewed the recent evaluation by Dr. Asencion Schmidt.  I also reviewed his [facility] emergency room evaluation in 2019 when he presented with right leg pain and was felt to have sciatica.    Prox RCA lesion is 70% stenosed.   RPAV lesion is 50% stenosed.   Dist RCA lesion is 50% stenosed.   Ost LAD to Prox LAD lesion is 100% stenosed.   Mid LM to Dist LM lesion is 85% stenosed.   Ramus lesion is 80% stenosed.   Ost Cx lesion is 70% stenosed.   Mid Cx to Dist Cx lesion is 50% stenosed.   There is moderate left ventricular systolic dysfunction.   LV end diastolic pressure is normal.   Severe multivessel CAD with 85% on distal left main stenosis; total ostial occlusion of a large LAD; 80% ostial ramus intermediate stenosis and 70% ostial circumflex stenosis.  There is 50% narrowing in the mid left circumflex after marginal branch.  There is significant dampening with each injection into the RCA.  There is 70% proximal RCA stenoses with 50% irregularity distally.  There is extensive collateralization from the distal RCA via septal collaterals supplying the LAD.   RECOMMENDATION: Patient will be admitted with plans for surgical consultation for CABG revascularization surgery due to high-grade left  main stenosis.   Intervention   CLINICAL INFORMATION Sleep Study Type: NPSG   Indication for sleep study: N/A   Epworth Sleepiness Score: 7   SLEEP STUDY TECHNIQUE As per the AASM Manual for the Scoring of Sleep and Associated Events v2.3 (April 2016) with a hypopnea requiring 4% desaturations.   The channels recorded and monitored were frontal, central and occipital EEG, electrooculogram (EOG), submentalis EMG (chin), nasal and oral airflow, thoracic and abdominal wall motion, anterior tibialis EMG, snore microphone, electrocardiogram, and pulse oximetry.   MEDICATIONS furosemide (LASIX) 20 MG tablet acetaminophen (TYLENOL) 325 MG tablet ALPRAZolam (XANAX) 0.5 MG tablet aspirin EC 81 MG tablet azithromycin (ZITHROMAX) 250 MG tablet Blood Glucose Monitoring Suppl (ONE TOUCH ULTRA 2) w/Device KIT dapagliflozin propanediol (FARXIGA) 10 MG TABS tablet glucose blood (ACCU-CHEK AVIVA PLUS) test strip Lancets (ONETOUCH ULTRASOFT) lancets metFORMIN (GLUCOPHAGE) 1000 MG tablet metoprolol tartrate (LOPRESSOR) 25 MG tablet nitroGLYCERIN (NITROSTAT) 0.4 MG SL tablet (Expired) rosuvastatin (CRESTOR)  40 MG tablet Medications self-administered by patient taken the night of the study : N/A   SLEEP ARCHITECTURE The study was initiated at 9:15:29 PM and ended at 5:28:00 AM.   Sleep onset time was 35.2 minutes and the sleep efficiency was 79.2%. The total sleep time was 390 minutes.   Stage REM latency was 144.5 minutes.   The patient spent 5.90% of the night in stage N1 sleep, 75.90% in stage N2 sleep, 1.41% in stage N3 and 16.8% in REM.   Alpha intrusion was absent.   Supine sleep was 28.20%.   RESPIRATORY PARAMETERS The overall apnea/hypopnea index (AHI) was 17.7 per hour. The respiraory disturbance index (RDI) was 17.7/h. There were 65 total apneas, including 2 obstructive, 63 central and 0 mixed apneas. There were 50 hypopneas and 0 RERAs.   The AHI during Stage REM sleep was 1.8  per hour.   AHI while supine was 56.2 per hour.   The mean oxygen saturation was 93.81%. The minimum SpO2 during sleep was 84.00%.   Loud snoring was noted during this study.   CARDIAC DATA The 2 lead EKG demonstrated sinus rhythm. The mean heart rate was 66.12 beats per minute. Other EKG findings include: None.   LEG MOVEMENT DATA The total PLMS were 0 with a resulting PLMS index of 0.00. Associated arousal with leg movement index was 0.0 .   IMPRESSIONS - Moderate obstructive sleep apnea overall (AHI 17.7/h; RDI 17.7): however, sleep apnea was severe with supine sleep (AHI 56.2/h). - Mild central sleep apnea occurred during this study (CAI = 9.7/h). - Mild oxygen desaturation to a nadir of 84%. - The patient snored with loud snoring volume. - No cardiac abnormalities were noted during this study. - Clinically significant periodic limb movements did not occur during sleep. No significant associated arousals.   DIAGNOSIS - Obstructive Sleep Apnea (G47.33) - Central Sleep Apnea (G47.37)   RECOMMENDATIONS - In-lab CPAP titration to determine optimal pressure required to alleviate sleep disordered breathing. BiPAP or ASV titration may be required to eliminate central sleep apnea. - Effort should be made to optimize nasal and oropharyngeal patency. - Positional therapy avoiding supine position during sleep. - Avoid alcohol, sedatives and other CNS depressants that may worsen sleep apnea and disrupt normal sleep architecture. - Sleep hygiene should be reviewed to assess factors that may improve sleep quality. - Weight management (BMI 33) and regular exercise should be initiated or continued if appropriate.    ASSESSMENT:    1. OSA (obstructive sleep apnea) on ASV   2. Essential hypertension   3. Coronary artery disease involving native coronary artery of native heart without angina pectoris   4. S/P CABG x 3   5. Hyperlipidemia with target LDL less than 55    PLAN:  Mr. Barry Schmidt is a 62 year-old gentleman who has a prior tobacco history and fortunately quit on his birthday November 25, 2017.   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   He had developed exertional chest tightness particularly when carrying objects.  His symptoms became fairly intense when he was at [facility] and was carrying a cooler across the infield where he had to stop on several times to catch his breath.  He denied any resting chest wall discomfort.  Cardiac risk factors  are notable for prior tobacco use, diabetes mellitus, hyperlipidemia, and family history for CAD.  Definitive cardiac catheterization revealed severe multivessel CAD with high-grade left main as well as ostial disease.  Following day he underwent successful CABG revascularization by Dr. Dorris Fetch and LIMA placed to his LAD, radial artery to OM 2, and SVG to PDA.  Subsequently has had some issues with lower extremity edema which improved with diuretic therapy.  Presently, he feels well from a cardiac standpoint.  He is not having any chest pain or shortness of breath.  Apparently his pulse has been getting low and at times may drop into the mid to upper 40s while sleeping.  He has been on metoprolol tartrate 25 mg twice a day I have suggested he reduce this to 12.5 mg twice a day.  Due to my concerns for obstructive sleep apnea he underwent a sleep evaluation at which confirmed moderate overall sleep apnea which was severe with supine sleep.  Of note, on his diagnostic evaluation he did have central events.  He underwent CPAP titration.  At his evaluation on April 02, 2022 he was  meeting compliance standards since his set up date of January 28, 2022.  He was set at a range of 12 to 16 cm with 95th percentile pressure at 13.2 and maximum average pressure 13.8.  His AHI is 6.5  with an apnea index of 6.3 and hypopnea index of 0.2.  He was having central apnea index of 5.5.  During that evaluation I reduced his pressure to a range of 10 to 16 cm of water and due to bradycardia further reduce metoprolol to 12.5 mg twice a day.  Lipid studies in December 2022 were excellent with an LDL of 47.  On serial downloads with CPAP he continued to have significant central events.  He subsequently underwent BiPAP titration unfortunately continue to have central events with BiPAP therapy.  He subsequently underwent an echo Doppler study which verified normal LV function.  He subsequently underwent successful ASV titration.  On May 06, 2023 he received a new ResMed air curve 11 ASV unit.  His current EPAP setting is 7 cm with minimum pressure support of 3 up to 15 cm of water.  His most recent download shows 100% compliance with average use at 9 hours and 8 minutes per night.  AHI is excellent at 0.6.  His 95th percentile pressure is 14.1/7 with maximum average pressure 16.7/7.  His sleep is significantly improved and he is no longer having any central events.  Since he just received a new machine, this should last him for greater than 5 years.  I discussed with him my plans for future retirement.  With his cardiac history, I will transition him to see Dr. Bryan Lemma after my retirement for cardiology care.   Medication Adjustments/Labs and Tests Ordered: Current medicines are reviewed at length with the patient today.  Concerns regarding medicines are outlined above.  Medication changes, Labs and Tests ordered today are listed in the Patient Instructions below. Patient Instructions  Medication Instructions:  YOUR MEDICATION REFILL HAS BEEN SENT TO YOUR LOCAL PHARMACY *If you need a refill on your cardiac medications before your next appointment, please call your pharmacy*   Lab Work: NONE If you have labs (blood work) drawn today and your tests are completely normal, you will receive  your results only by: MyChart Message (if you have MyChart) OR A paper copy in the mail If you have any lab test that is  abnormal or we need to change your treatment, we will call you to review the results.   Testing/Procedures: NONE   Follow-Up: At [facility], you and your health needs are our priority.  As part of our continuing mission to provide you with exceptional heart care, we have created designated Provider Care Teams.  These Care Teams include your primary Cardiologist (physician) and Advanced Practice Providers (APPs -  Physician Assistants and Nurse Practitioners) who all work together to provide you with the care you need, when you need it.  We recommend signing up for the patient portal called "MyChart".  Sign up information is provided on this After Visit Summary.  MyChart is used to connect with patients for Virtual Visits (Telemedicine).  Patients are able to view lab/test results, encounter notes, upcoming appointments, etc.  Non-urgent messages can be sent to your provider as well.   To learn more about what you can do with MyChart, go to [email or web address].    Your next appointment:   1 year(s)  Provider:   DR. Bryan Lemma      Signed, Barry Guadal[place], MD, [facility], ABSM Diplomate, American Board of Sleep Medicine   08/05/2023 7:07 PM    [facility] [address], [place], [place]  [address] Phone: [phone]

## 2023-07-30 NOTE — Patient Instructions (Signed)
Medication Instructions:  YOUR MEDICATION REFILL HAS BEEN SENT TO YOUR LOCAL PHARMACY *If you need a refill on your cardiac medications before your next appointment, please call your pharmacy*   Lab Work: NONE If you have labs (blood work) drawn today and your tests are completely normal, you will receive your results only by: MyChart Message (if you have MyChart) OR A paper copy in the mail If you have any lab test that is abnormal or we need to change your treatment, we will call you to review the results.   Testing/Procedures: NONE   Follow-Up: At [facility], you and your health needs are our priority.  As part of our continuing mission to provide you with exceptional heart care, we have created designated Provider Care Teams.  These Care Teams include your primary Cardiologist (physician) and Advanced Practice Providers (APPs -  Physician Assistants and Nurse Practitioners) who all work together to provide you with the care you need, when you need it.  We recommend signing up for the patient portal called "MyChart".  Sign up information is provided on this After Visit Summary.  MyChart is used to connect with patients for Virtual Visits (Telemedicine).  Patients are able to view lab/test results, encounter notes, upcoming appointments, etc.  Non-urgent messages can be sent to your provider as well.   To learn more about what you can do with MyChart, go to [email or web address].    Your next appointment:   1 year(s)  Provider:   DR. Bryan Lemma

## 2023-08-01 ENCOUNTER — Other Ambulatory Visit: Payer: Self-pay | Admitting: Family Medicine

## 2023-08-05 ENCOUNTER — Encounter: Payer: Self-pay | Admitting: Cardiovascular Disease

## 2023-10-25 ENCOUNTER — Other Ambulatory Visit: Payer: Self-pay | Admitting: Family Medicine

## 2023-10-26 ENCOUNTER — Other Ambulatory Visit: Payer: Self-pay | Admitting: Cardiovascular Disease

## 2023-11-05 ENCOUNTER — Encounter: Payer: Self-pay | Admitting: Family Medicine

## 2023-11-05 ENCOUNTER — Ambulatory Visit: Payer: 59 | Admitting: Family Medicine

## 2023-11-05 VITALS — BP 104/69 | HR 65 | Temp 97.7°F | Ht 68.0 in | Wt 200.2 lb

## 2023-11-05 DIAGNOSIS — E1140 Type 2 diabetes mellitus with diabetic neuropathy, unspecified: Secondary | ICD-10-CM

## 2023-11-05 DIAGNOSIS — J0100 Acute maxillary sinusitis, unspecified: Secondary | ICD-10-CM | POA: Diagnosis not present

## 2023-11-05 DIAGNOSIS — E782 Mixed hyperlipidemia: Secondary | ICD-10-CM | POA: Diagnosis not present

## 2023-11-05 DIAGNOSIS — I2510 Atherosclerotic heart disease of native coronary artery without angina pectoris: Secondary | ICD-10-CM | POA: Diagnosis not present

## 2023-11-05 DIAGNOSIS — G4733 Obstructive sleep apnea (adult) (pediatric): Secondary | ICD-10-CM

## 2023-11-05 DIAGNOSIS — Z0001 Encounter for general adult medical examination with abnormal findings: Secondary | ICD-10-CM | POA: Diagnosis not present

## 2023-11-05 DIAGNOSIS — E1169 Type 2 diabetes mellitus with other specified complication: Secondary | ICD-10-CM | POA: Diagnosis not present

## 2023-11-05 DIAGNOSIS — Z7984 Long term (current) use of oral hypoglycemic drugs: Secondary | ICD-10-CM

## 2023-11-05 DIAGNOSIS — F4322 Adjustment disorder with anxiety: Secondary | ICD-10-CM

## 2023-11-05 LAB — LIPID PANEL
Cholesterol: 81 mg/dL (ref 0–200)
HDL: 46.5 mg/dL (ref >39.00)
LDL Cholesterol: 20 mg/dL (ref 0–99)
NonHDL: 34.16
Total CHOL/HDL Ratio: 2
Triglycerides: 73 mg/dL (ref 0.0–149.0)
VLDL: 14.6 mg/dL (ref 0.0–40.0)

## 2023-11-05 LAB — POCT GLYCOSYLATED HEMOGLOBIN (HGB A1C): Hemoglobin A1C: 6.3 % — AB (ref 4.0–5.6)

## 2023-11-05 LAB — CBC WITH DIFFERENTIAL/PLATELET
Basophils Absolute: 0 10*3/uL (ref 0.0–0.1)
Basophils Relative: 0.3 % (ref 0.0–3.0)
Eosinophils Absolute: 0.1 10*3/uL (ref 0.0–0.7)
Eosinophils Relative: 1.6 % (ref 0.0–5.0)
HCT: 41.7 % (ref 39.0–52.0)
Hemoglobin: 13.7 g/dL (ref 13.0–17.0)
Lymphocytes Relative: 23 % (ref 12.0–46.0)
Lymphs Abs: 1.3 10*3/uL (ref 0.7–4.0)
MCHC: 32.9 g/dL (ref 30.0–36.0)
MCV: 83.4 fL (ref 78.0–100.0)
Monocytes Absolute: 0.5 10*3/uL (ref 0.1–1.0)
Monocytes Relative: 9 % (ref 3.0–12.0)
Neutro Abs: 3.7 10*3/uL (ref 1.4–7.7)
Neutrophils Relative %: 66.1 % (ref 43.0–77.0)
Platelets: 177 10*3/uL (ref 150.0–400.0)
RBC: 5 Mil/uL (ref 4.22–5.81)
RDW: 14.7 % (ref 11.5–15.5)
WBC: 5.5 10*3/uL (ref 4.0–10.5)

## 2023-11-05 LAB — HEMOGLOBIN A1C: Hgb A1c MFr Bld: 7 % — ABNORMAL HIGH (ref 4.6–6.5)

## 2023-11-05 LAB — COMPREHENSIVE METABOLIC PANEL
ALT: 17 U/L (ref 0–53)
AST: 21 U/L (ref 0–37)
Albumin: 4.4 g/dL (ref 3.5–5.2)
Alkaline Phosphatase: 52 U/L (ref 39–117)
BUN: 20 mg/dL (ref 6–23)
CO2: 27 meq/L (ref 19–32)
Calcium: 8.9 mg/dL (ref 8.4–10.5)
Chloride: 102 meq/L (ref 96–112)
Creatinine, Ser: 0.84 mg/dL (ref 0.40–1.50)
GFR: 94.5 mL/min (ref >60.00)
Glucose, Bld: 99 mg/dL (ref 70–99)
Potassium: 4.1 meq/L (ref 3.5–5.1)
Sodium: 138 meq/L (ref 135–145)
Total Bilirubin: 0.4 mg/dL (ref 0.2–1.2)
Total Protein: 7 g/dL (ref 6.0–8.3)

## 2023-11-05 LAB — MICROALBUMIN / CREATININE URINE RATIO
Creatinine,U: 122.7 mg/dL
Microalb Creat Ratio: 0.6 mg/g (ref 0.0–30.0)
Microalb, Ur: 0.7 mg/dL (ref 0.0–1.9)

## 2023-11-05 LAB — TSH: TSH: 4.05 u[IU]/mL (ref 0.35–5.50)

## 2023-11-05 MED ORDER — SERTRALINE HCL 100 MG PO TABS: 200.0000 mg | ORAL_TABLET | Freq: Every day | ORAL | Status: DC

## 2023-11-05 MED ORDER — SULFAMETHOXAZOLE-TRIMETHOPRIM 800-160 MG PO TABS
1.0000 | ORAL_TABLET | Freq: Two times a day (BID) | ORAL | 0 refills | Status: DC
Start: 2023-11-05 — End: 2024-05-04

## 2023-11-05 NOTE — Progress Notes (Signed)
Subjective  Chief Complaint  Patient presents with   Annual Exam   Diabetes   Coronary Artery Disease    HPI: Barry Schmidt is a 63 y.o. male who presents to [facility] [place] today for a Male Wellness Visit. He also has the concerns and/or needs as listed above in the chief complaint. These will be addressed in addition to the Health Maintenance Visit.   Wellness Visit: annual visit with health maintenance review and exam   HM: CRC is current. Feels well. No new concerns. Imms and eye exam are up to date.   Body mass index is 30.44 kg/m. Wt Readings from Last 3 Encounters:  11/05/23 200 lb 3.2 oz (90.8 kg)  07/30/23 201 lb (91.2 kg)  06/05/23 201 lb 3.2 oz (91.3 kg)     Chronic disease management visit and/or acute problem visit: Discussed the use of AI scribe software for clinical note transcription with the patient, who gave verbal consent to proceed.  History of Present Illness   The patient is a 63 year old male with type 2 diabetes and sleep apnea who presents for an annual physical exam and sinus congestion.  He has been experiencing sinus congestion for about a week, with symptoms including pressure and headaches. His ears feel full, but there is no infection. He is concerned about a possible sinus infection and requests his ears to be checked. He mentions that his wife had bronchitis last year, and he has been picking up symptoms at work.  He has a history of type 2 diabetes, with a recent A1c of 6.3, which is an improvement from previous levels. He is currently on Ozempic, which causes runny stools two days after administration, but he denies any significant nausea. He is managing his diabetes well through the holiday season.  He also has a history of sleep apnea, which he reports is improving. He mentions sleeping longer but still feeling tired. He is managing work-related stressors and feels he is coping better.  He is on Zoloft for anxiety and  has been trying to reduce his dosage from twice a day to once a day, but returned to the original dosage as it did not alleviate his symptoms. He has taken Xanax sparingly, only three times since the last visit, and finds it effective for acute anxiety episodes. No chest pain, shortness of breath, or leg pain. He reports feeling well overall.   CAD and HTN: reviewed cardiology notes. Very stable. No cp. No sob. Lipid LDL goal < 55 on statin and due for recheck. Fasting.      Assessment  1. Encounter for well adult exam with abnormal findings   2. Controlled type 2 diabetes mellitus with neuropathy (HCC)   3. Coronary artery disease involving native coronary artery of native heart without angina pectoris   4. OSA on CPAP   5. Combined hyperlipidemia associated with type 2 diabetes mellitus (HCC)   6. Adjustment reaction with anxiety      Plan  Male Wellness Visit: Age appropriate Health Maintenance and Prevention measures were discussed with patient. Included topics are cancer screening recommendations, ways to keep healthy (see AVS) including dietary and exercise recommendations, regular eye and dental care, use of seat belts, and avoidance of moderate alcohol use and tobacco use.  BMI: discussed patient's BMI and encouraged positive lifestyle modifications to help get to or maintain a target BMI. HM needs and immunizations were addressed and ordered. See below for orders. See HM  and immunization section for updates. Routine labs and screening tests ordered including cmp, cbc and lipids where appropriate. Discussed recommendations regarding Vit D and calcium supplementation (see AVS)  Chronic disease f/u and/or acute problem visit: (deemed necessary to be done in addition to the wellness visit): Assessment and Plan    Sinusitis Reports of sinus pressure and headaches for about a week. No fever or other systemic symptoms. -Prescribe antibiotics. -Consider over-the-counter Mucinex or an  allergy pill to help with congestion.  Type 2 Diabetes Mellitus A1c of 6.3, improved from previous visit. Reports of loose stools after Ozempic injection. -Continue current regimen of Ozempic 0.5mg  weekly, farxiga 10 and met 1000 bid.  -Check blood work and urine test today.  Anxiety Reports of occasional use of Xanax for situational anxiety. Patient has been trying to wean off Zoloft due to concerns about recent news reports. -Continue current regimen of Zoloft and Xanax. -Research recent studies on Zoloft and communicate findings to patient.  HTN and HLD: on BB and statin. Check fasting labs. Check lfts. CAD on aspirin and BB and stable.   General Health Maintenance / Followup Plans -Continue current management of blood pressure and sleep apnea. -Complete blood work and urine test today. -Follow-up in six months or sooner if needed.   For DM and HTN recheck  Commons side effects, risks, benefits, and alternatives for medications and treatment plan prescribed today were discussed, and the patient expressed understanding of the given instructions. Patient is instructed to call or message via MyChart if he/she has any questions or concerns regarding our treatment plan. No barriers to understanding were identified. We discussed Red Flag symptoms and signs in detail. Patient expressed understanding regarding what to do in case of urgent or emergency type symptoms.  Medication list was reconciled, printed and provided to the patient in AVS. Patient instructions and summary information was reviewed with the patient as documented in the AVS. This note was prepared with assistance of Dragon voice recognition software. Occasional wrong-word or sound-a-like substitutions may have occurred due to the inherent limitations of voice recognition software  Orders Placed This Encounter  Procedures   POCT HgB A1C   No orders of the defined types were placed in this encounter.    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   Femoral neuropathy of right lower extremity 10/14/2018   Urticaria 01/01/2022   Health Maintenance  Topic Date Due   Diabetic kidney evaluation - Urine ACR  10/30/2023   Diabetic kidney evaluation - eGFR measurement  11/08/2023   FOOT EXAM  02/24/2024   HEMOGLOBIN A1C  05/04/2024   OPHTHALMOLOGY EXAM  06/03/2024   Colonoscopy  05/20/2030   DTaP/Tdap/Td (3 - Td or Tdap) 10/29/2032   Pneumococcal Vaccine 29-47 Years old  Completed   Hepatitis C Screening  Completed   HIV Screening  Completed   Zoster Vaccines- Shingrix  Completed   HPV VACCINES  Aged Out   INFLUENZA VACCINE  Discontinued   COVID-19 Vaccine  Discontinued   Immunization  History  Administered Date(s) Administered   Influenza,inj,Quad PF,6+ Mos 06/24/2018, 06/03/2019, 06/25/2021   Janssen (J&J) SARS-COV-2 Vaccination 02/29/2020   PNEUMOCOCCAL CONJUGATE-20 10/01/2021   Pneumococcal Polysaccharide-23 10/06/2010   Tdap 10/06/2012, 10/29/2022   Zoster Recombinant(Shingrix) 10/01/2021, 04/02/2022   We updated and reviewed the patient's past history in detail and it is documented below. Allergies: Patient is allergic to penicillins. Past Medical History  has a past medical history of Diabetes mellitus without complication (HCC), Hyperlipidemia, Hypertension, On statin therapy due to risk of future cardiovascular event (10/14/2018), OSA (obstructive sleep apnea) (09/16/2021), S/P CABG x 3, 04/2021, Sleep apnea, and Uncontrolled  diabetes mellitus without complication, with long-term current use of insulin (06/23/2018). Past Surgical History Patient  has a past surgical history that includes LEFT HEART CATH AND CORONARY ANGIOGRAPHY (N/A, 04/24/2021); Coronary artery bypass graft (N/A, 04/25/2021); Radial artery harvest (Left, 04/25/2021); TEE without cardioversion (N/A, 04/25/2021); Endoharvest vein of greater saphenous vein (Bilateral, 04/25/2021); Tonsilectomy/adenoidectomy with myringotomy (2004); and Colonoscopy (05/21/2023). Social History Patient  reports that he quit smoking about 3 years ago. His smoking use included cigarettes. He has never used smokeless tobacco. He reports current alcohol use. He reports that he does not use drugs. Family History family history includes Colon polyps in his brother, brother, brother, father, and sister; Diabetes in his brother, brother, and sister; Diabetes type I in his father; Healthy in his son and son; Heart attack in his father; Heart disease in his mother; Hypertension in his mother. Review of Systems: Constitutional: negative for fever or malaise Ophthalmic: negative for photophobia, double vision or loss of vision Cardiovascular: negative for chest pain, dyspnea on exertion, or new LE swelling Respiratory: negative for SOB or persistent cough Gastrointestinal: negative for abdominal pain, change in bowel habits or melena Genitourinary: negative for dysuria or gross hematuria Musculoskeletal: negative for new gait disturbance or muscular weakness Integumentary: negative for new or persistent rashes Neurological: negative for TIA or stroke symptoms Psychiatric: negative for SI or delusions Allergic/Immunologic: negative for hives  Patient Care Team    Relationship Specialty Notifications Start End  Willow Ora, MD PCP - General Family Medicine  06/23/18   Lennette Bihari, MD PCP - Cardiology Cardiology  05/20/21   York Spaniel, MD (Inactive) Consulting Physician  Neurology  06/03/19   Ranee Gosselin, MD Consulting Physician Orthopedic Surgery  06/03/19    Objective  Vitals: BP 104/69   Pulse 65   Temp 97.7 F (36.5 C)   Ht 5\' 8"  (1.727 m)   Wt 200 lb 3.2 oz (90.8 kg)   SpO2 97%   BMI 30.44 kg/m  General:  Well developed, well nourished, no acute distress  Psych:  Alert and orientedx3,normal mood and affect HEENT:  Normocephalic, atraumatic, non-icteric sclera,  oropharynx is clear without mass or exudate, supple neck without adenopathy, or thyromegaly, + max sinus ttp, mild congestion.  Cardiovascular:  Normal S1, S2, RRR without gallop, rub or murmur,  Respiratory:  Good breath sounds bilaterally, CTAB with normal respiratory effort Gastrointestinal: normal bowel sounds, soft, non-tender, no noted masses. No HSM MSK: Joints are without erythema or swelling.  Skin:  Warm, no rashes Neurologic:    Mental status is normal.  Gross motor and sensory exams are normal. Stable gait. No tremor

## 2023-11-05 NOTE — Patient Instructions (Addendum)
Please return in 6 months to recheck diabetes and blood pressure   I will release your lab results to you on your MyChart account with further instructions. You may see the results before I do, but when I review them I will send you a message with my report or have my assistant call you if things need to be discussed. Please reply to my message with any questions. Thank you!   If you have any questions or concerns, please don't hesitate to send me a message via MyChart or call the office at [phone]. Thank you for visiting with [name] today! It's our pleasure caring for you.   VISIT SUMMARY:  Today, you came in for your annual physical exam and to discuss your sinus congestion. We reviewed your history of type 2 diabetes, sleep apnea, and anxiety. You reported sinus pressure and headaches for about a week, and we discussed your current medications and overall well-being.  YOUR PLAN:  -SINUSITIS: Sinusitis is an inflammation or swelling of the tissue lining the sinuses, often causing pressure and headaches. We will prescribe antibiotics to help with the infection, and you may consider using over-the-counter Mucinex or an allergy pill to relieve congestion.  -TYPE 2 DIABETES MELLITUS: Type 2 diabetes is a condition that affects the way your body processes blood sugar. Your recent A1c of 6.3 shows improvement. Continue with your current Ozempic and metformin and farxiga regimen, and we will check your blood work and urine test today.  -ANXIETY: Anxiety is a feeling of worry or fear that can be mild or severe. You have been using Xanax rarely for situational anxiety and have tried to reduce your Zoloft dosage. Continue with your current regimen of Zoloft and Xanax. I will research recent studies on Zoloft and share the findings with you.  -GENERAL HEALTH MAINTENANCE: We will continue managing your blood pressure and sleep apnea. Please complete the blood work and urine test today, and follow up in six  months or sooner if needed.  INSTRUCTIONS:  Please complete the blood work and urine test today. Follow up in six months or sooner if needed.

## 2023-11-09 ENCOUNTER — Encounter: Payer: Self-pay | Admitting: Family Medicine

## 2023-11-09 NOTE — Progress Notes (Signed)
The A1c's are very different. Can you please request that we have our POC maching recalibrated or checked?  Thanks.

## 2023-11-12 MED ORDER — SEMAGLUTIDE (1 MG/DOSE) 4 MG/3ML ~~LOC~~ SOPN: 1.0000 mg | PEN_INJECTOR | SUBCUTANEOUS | 11 refills | Status: DC

## 2023-11-13 ENCOUNTER — Telehealth: Payer: Self-pay

## 2023-11-13 ENCOUNTER — Other Ambulatory Visit (HOSPITAL_COMMUNITY): Payer: Self-pay

## 2023-11-13 NOTE — Telephone Encounter (Signed)
 Pharmacy Patient Advocate Encounter   Received notification from Physician's Office that prior authorization for OZEMPIC  is required/requested.   Insurance verification completed.   The patient is insured through [employer] .   Per test claim: PA required; PA submitted to above mentioned insurance via CoverMyMeds Key/confirmation #/EOC Key: [id number] Status is pending

## 2023-11-18 ENCOUNTER — Other Ambulatory Visit (HOSPITAL_COMMUNITY): Payer: Self-pay

## 2023-11-18 NOTE — Telephone Encounter (Signed)
Pharmacy Patient Advocate Encounter  Received notification from [employer] that Prior Authorization for Ozempic (1 MG/DOSE) 4MG /3ML pen-injectorshas been APPROVED from 2.7.25 to 2.7.26. Ran test claim, Copay is $RTS AND IS PAYABLE AGAIN ON/AFTER 4.9.25. This test claim was processed through [facility]- copay amounts may vary at other pharmacies due to pharmacy/plan contracts, or as the patient moves through the different stages of their insurance plan.   PA #/Case ID/Reference #: Key: [id number]

## 2024-02-09 ENCOUNTER — Other Ambulatory Visit: Payer: Self-pay | Admitting: Family Medicine

## 2024-03-03 ENCOUNTER — Telehealth: Payer: Self-pay

## 2024-03-03 NOTE — Telephone Encounter (Signed)
 Copied from CRM [id number]. Topic: Clinical - Medical Advice >> Mar 03, 2024 10:23 AM Albertha Alosa wrote: Reason for CRM: Patient called in stating he has his sinus infection again, would like to  know if Dr.Andy could send in the prescription for that  Please advise  Message has been sent to provider to address

## 2024-03-07 NOTE — Telephone Encounter (Signed)
 Noted

## 2024-03-20 ENCOUNTER — Other Ambulatory Visit: Payer: Self-pay | Admitting: Cardiovascular Disease

## 2024-03-23 ENCOUNTER — Telehealth: Payer: Self-pay | Admitting: Cardiovascular Disease

## 2024-03-23 MED ORDER — METOPROLOL TARTRATE 25 MG PO TABS: ORAL_TABLET | ORAL | 1 refills | Status: DC

## 2024-03-23 NOTE — Telephone Encounter (Signed)
 Pt's medication was sent to pt's pharmacy as requested. Confirmation received.

## 2024-03-23 NOTE — Telephone Encounter (Signed)
*  STAT* If patient is at the pharmacy, call can be transferred to refill team.   1. Which medications need to be refilled? (please list name of each medication and dose if known)   metoprolol  tartrate (LOPRESSOR ) 25 MG tablet   2. Which pharmacy/location (including street and city if local pharmacy) is medication to be sent to? [id number] - [place], [place] - [address] Phone: [phone]  Fax: [phone]     3. Do they need a 30 day or 90 day supply? 90

## 2024-05-04 ENCOUNTER — Encounter: Payer: Self-pay | Admitting: Family Medicine

## 2024-05-04 ENCOUNTER — Ambulatory Visit: Payer: 59 | Admitting: Family Medicine

## 2024-05-04 VITALS — BP 120/70 | HR 65 | Temp 97.7°F | Ht 68.0 in | Wt 200.6 lb

## 2024-05-04 DIAGNOSIS — Z7985 Long-term (current) use of injectable non-insulin antidiabetic drugs: Secondary | ICD-10-CM

## 2024-05-04 DIAGNOSIS — I2510 Atherosclerotic heart disease of native coronary artery without angina pectoris: Secondary | ICD-10-CM | POA: Diagnosis not present

## 2024-05-04 DIAGNOSIS — M24549 Contracture, unspecified hand: Secondary | ICD-10-CM

## 2024-05-04 DIAGNOSIS — E118 Type 2 diabetes mellitus with unspecified complications: Secondary | ICD-10-CM | POA: Diagnosis not present

## 2024-05-04 DIAGNOSIS — E1140 Type 2 diabetes mellitus with diabetic neuropathy, unspecified: Secondary | ICD-10-CM

## 2024-05-04 DIAGNOSIS — G4733 Obstructive sleep apnea (adult) (pediatric): Secondary | ICD-10-CM

## 2024-05-04 DIAGNOSIS — Z7984 Long term (current) use of oral hypoglycemic drugs: Secondary | ICD-10-CM

## 2024-05-04 DIAGNOSIS — F4322 Adjustment disorder with anxiety: Secondary | ICD-10-CM | POA: Diagnosis not present

## 2024-05-04 DIAGNOSIS — Z951 Presence of aortocoronary bypass graft: Secondary | ICD-10-CM

## 2024-05-04 LAB — MICROALBUMIN / CREATININE URINE RATIO
Creatinine,U: 133.1 mg/dL
Microalb Creat Ratio: 8.3 mg/g (ref 0.0–30.0)
Microalb, Ur: 1.1 mg/dL (ref 0.0–1.9)

## 2024-05-04 LAB — POCT GLYCOSYLATED HEMOGLOBIN (HGB A1C): Hemoglobin A1C: 6.4 % — AB (ref 4.0–5.6)

## 2024-05-04 MED ORDER — ALPRAZOLAM 0.5 MG PO TABS: 0.5000 mg | ORAL_TABLET | Freq: Every day | ORAL | 2 refills | Status: AC | PRN

## 2024-05-04 NOTE — Patient Instructions (Signed)
 Please return in 6 months for your annual complete physical; please come fasting.  For follow up on chronic medical conditions   If you have any questions or concerns, please don't hesitate to send me a message via MyChart or call the office at [phone]. Thank you for visiting with us  today! It's our pleasure caring for you.    VISIT SUMMARY: Today, you came in for a follow-up visit to discuss your diabetes management, intermittent spasms, hand contracture, and anxiety. We reviewed your current medications and overall health status.  YOUR PLAN: -HEART DISEASE: Your heart function is good, and there are no current concerns regarding your heart health.  -TYPE 2 DIABETES MELLITUS: Your diabetes is being managed well with an A1c of 6.3 and a morning blood sugar level of 116 mg/dL. Ozempic  is effective without any adverse effects. We will perform a urine test to check your kidney function.  -DUPUYTREN'S CONTRACTURE OF HAND: This condition causes a thickening of the tissue in your hand, leading to contractures. It is mild right now with occasional pain and no significant limitations. We will monitor its progression and refer you to a hand specialist if it worsens.  -EPISODIC ANXIETY: Your anxiety episodes are related to stress and are currently managed with Xanax , which you take twice a week. We will refill your Xanax  prescription.  INSTRUCTIONS: Please complete the urine test for kidney function as discussed. Continue monitoring your hand for any changes and let us  know if the contracture worsens. Follow up with us  if you experience any new symptoms or have concerns.                      Contains text generated by Abridge.                                 Contains text generated by Abridge.

## 2024-05-04 NOTE — Progress Notes (Signed)
 Subjective  CC:  Chief Complaint  Patient presents with   Anxiety   Diabetes    HPI: Barry Schmidt is a 63 y.o. male who presents to the office today for follow up of diabetes and problems listed above in the chief complaint.  Discussed the use of AI scribe software for clinical note transcription with the patient, who gave verbal consent to proceed.  History of Present Illness Barry Schmidt is a 63 year old male with diabetes who presents for a follow-up visit.  He experiences intermittent spasms causing shaking on either the left or right side of his body. These episodes occur unexpectedly while sitting or lying down. His wife has noticed these episodes, but he is unsure of their cause.  He describes a contracture in his hand, initially thought to be a callus. The contracture is not consistently painful but is noticeable. He denies any specific activities that might have caused this, such as golfing, and notes that this is the arm from which a graft was taken previously.  He has a history of diabetes, with a recent A1c of 6.3, slightly higher than previous readings. He monitors his blood sugar levels regularly, with a recent reading of 116 mg/dL. His dietary habits include consuming raspberries, bananas, steak, macaroni and cheese, and occasionally ice cream, which can affect his morning blood sugar levels.  Taking metformin  twice daily, Farxiga  10 and Ozempic  1 mg weekly.  No adverse side effects.  Weight is stable.  No peripheral neuropathy symptoms or foot sores.  anxiety disorder mostly stable on sertraline  200 mg daily.  He also uses Xanax  occasionally, about twice a week, to manage stress related to work, particularly when dealing with a specific colleague. It helps him remain functional and not 'loopy' at work.  Stress at work continues to be a problem due to difficult relationship with his supervisor.  He has 1 to 1.5 years left before he retires.  He reports he is managing as best  he can.  Request refill of Xanax .  Sertraline  continues to help as well.  No depressive symptoms.  Coronary disease: No chest pain or shortness of breath.    Wt Readings from Last 3 Encounters:  05/04/24 200 lb 9.6 oz (91 kg)  11/05/23 200 lb 3.2 oz (90.8 kg)  07/30/23 201 lb (91.2 kg)    BP Readings from Last 3 Encounters:  05/04/24 120/70  11/05/23 104/69  07/30/23 (!) 100/58    Assessment  1. Controlled type 2 diabetes mellitus with neuropathy (HCC)   2. Coronary artery disease involving native coronary artery of native heart without angina pectoris   3. OSA on CPAP   4. S/P CABG x 3, 04/2021   5. Adjustment reaction with anxiety      Plan  Assessment and Plan Assessment & Plan CAD: Stable.  Continues on aspirin  beta-blocker and statin.  Type 2 diabetes mellitus A1c 6.3. Controlled.-Continue metformin  and Farxiga  and Ozempic .  Continue healthy diet and exercise.  Recommend increasing exercise.  Check urine nephropathy screen today.   Dupuytren's contracture of hand Mild symptoms, occasional pain, no significant limitation. Related to diabetes. Surgery if symptoms worsen. - Monitor progression. - Refer to hand specialist if worsens.  Episodic anxiety Episodes due to stressors, managed with Xanax  twice weekly, no impairment. - Refill Xanax  prescription. - Continue sertraline  200 mg daily    Follow up: 6 months for complete physical and follow-up chronic problems Orders Placed This Encounter  Procedures   Microalbumin / creatinine  urine ratio   POCT HgB A1C   No orders of the defined types were placed in this encounter.     Immunization History  Administered Date(s) Administered   Influenza,inj,Quad PF,6+ Mos 06/24/2018, 06/03/2019, 06/25/2021   Janssen (J&J) SARS-COV-2 Vaccination 02/29/2020   PNEUMOCOCCAL CONJUGATE-20 10/01/2021   Pneumococcal Polysaccharide-23 10/06/2010   Tdap 10/06/2012, 10/29/2022   Zoster Recombinant(Shingrix) 10/01/2021,  04/02/2022    Diabetes Related Lab Review: Lab Results  Component Value Date   HGBA1C 6.4 (A) 05/04/2024   HGBA1C 7.0 (H) 11/05/2023   HGBA1C 6.3 (A) 11/05/2023    No results found for: MACKEY CURRENT Lab Results  Component Value Date   CREATININE 0.84 11/05/2023   BUN 20 11/05/2023   NA 138 11/05/2023   K 4.1 11/05/2023   CL 102 11/05/2023   CO2 27 11/05/2023   Lab Results  Component Value Date   CHOL 81 11/05/2023   CHOL 98 10/29/2022   CHOL 88 10/01/2021   Lab Results  Component Value Date   HDL 46.50 11/05/2023   HDL 52.20 10/29/2022   HDL 49.30 10/01/2021   Lab Results  Component Value Date   LDLCALC 20 11/05/2023   LDLCALC 34 10/29/2022   LDLCALC 27 10/01/2021   Lab Results  Component Value Date   TRIG 73.0 11/05/2023   TRIG 58.0 10/29/2022   TRIG 60.0 10/01/2021   Lab Results  Component Value Date   CHOLHDL 2 11/05/2023   CHOLHDL 2 10/29/2022   CHOLHDL 2 10/01/2021   Lab Results  Component Value Date   LDLDIRECT 90.0 06/23/2018   The ASCVD Risk score (Arnett DK, et al., 2019) failed to calculate for the following reasons:   The valid total cholesterol range is 130 to 320 mg/dL I have reviewed the PMH, Fam and Soc history. Patient Active Problem List   Diagnosis Date Noted   Tubular adenoma of colon 06/05/2023    Priority: High    Colonoscopy 05/2023: polyp x 2. Recall in 7 years. Dr. Legrand Saris sleep apnea 09/03/2022    Priority: High   OSA on CPAP 09/16/2021    Priority: High    Central and obstructive: sleep study 09/2021;has tried  bipap. Avoid supine position. Changed to CPAP 2024 and doing well.    S/P CABG x 3, 04/2021     Priority: High   CAD (coronary artery disease), native coronary artery 04/24/2021    Priority: High   Former smoker 04/02/2021    Priority: High    Quit 11/2019    Controlled type 2 diabetes mellitus with neuropathy (HCC) 10/14/2018    Priority: High    Diagnosed in 2007; was morbidly obese  at that time 260 pounds. He lost weight and did well.  Negative urine MAC 07/2018-2024; not on ACE.  CAD, neuropathy are complications Met/farxiga  and started ozempic  01/2023    Combined hyperlipidemia associated with type 2 diabetes mellitus (HCC) 10/14/2018    Priority: High   Adjustment reaction with anxiety 10/01/2021    Priority: Medium    Adhesive capsulitis of left shoulder 01/19/2020    Priority: Medium    Femoral neuropathy of right lower extremity 10/14/2018    Priority: Medium    Urticaria 01/01/2022    Priority: Low    Social History: Patient  reports that he quit smoking about 4 years ago. His smoking use included cigarettes. He has never used smokeless tobacco. He reports current alcohol use. He reports that he does not use drugs.  Review of Systems: Ophthalmic: negative for eye pain, loss of vision or double vision Cardiovascular: negative for chest pain Respiratory: negative for SOB or persistent cough Gastrointestinal: negative for abdominal pain Genitourinary: negative for dysuria or gross hematuria MSK: negative for foot lesions Neurologic: negative for weakness or gait disturbance  Objective  Vitals: BP 120/70   Pulse 65   Temp 97.7 F (36.5 C)   Ht 5' 8 (1.727 m)   Wt 200 lb 9.6 oz (91 kg)   SpO2 96%   BMI 30.50 kg/m  General: well appearing, no acute distress  Psych:  Alert and oriented, normal mood and affect HEENT:  Normocephalic, atraumatic, moist mucous membranes, supple neck  Cardiovascular:  Nl S1 and S2, RRR without murmur, gallop or rub. no edema Respiratory:  Good breath sounds bilaterally, CTAB with normal effort, no rales  Diabetic education: ongoing education regarding chronic disease management for diabetes was given today. We continue to reinforce the ABC's of diabetic management: A1c (<7 or 8 dependent upon patient), tight blood pressure control, and cholesterol management with goal LDL < 100 minimally. We discuss diet strategies,  exercise recommendations, medication options and possible side effects. At each visit, we review recommended immunizations and preventive care recommendations for diabetics and stress that good diabetic control can prevent other problems. See below for this patient's data. Commons side effects, risks, benefits, and alternatives for medications and treatment plan prescribed today were discussed, and the patient expressed understanding of the given instructions. Patient is instructed to call or message via MyChart if he/she has any questions or concerns regarding our treatment plan. No barriers to understanding were identified. We discussed Red Flag symptoms and signs in detail. Patient expressed understanding regarding what to do in case of urgent or emergency type symptoms.  Medication list was reconciled, printed and provided to the patient in AVS. Patient instructions and summary information was reviewed with the patient as documented in the AVS. This note was prepared with assistance of Dragon voice recognition software. Occasional wrong-word or sound-a-like substitutions may have occurred due to the inherent limitations of voice recognition software

## 2024-06-22 ENCOUNTER — Other Ambulatory Visit: Payer: Self-pay | Admitting: Adult Health

## 2024-06-22 ENCOUNTER — Other Ambulatory Visit: Payer: Self-pay | Admitting: Family Medicine

## 2024-06-22 DIAGNOSIS — E1140 Type 2 diabetes mellitus with diabetic neuropathy, unspecified: Secondary | ICD-10-CM

## 2024-06-22 MED ORDER — METOPROLOL TARTRATE 25 MG PO TABS: ORAL_TABLET | ORAL | 0 refills | Status: DC

## 2024-08-09 ENCOUNTER — Telehealth: Payer: Self-pay

## 2024-08-09 NOTE — Telephone Encounter (Signed)
 SABRA

## 2024-08-10 ENCOUNTER — Encounter: Payer: Self-pay | Admitting: Cardiology

## 2024-08-10 ENCOUNTER — Ambulatory Visit: Attending: Cardiology | Admitting: Cardiology

## 2024-08-10 VITALS — BP 104/62 | HR 72 | Ht 68.0 in | Wt 204.4 lb

## 2024-08-10 DIAGNOSIS — I10 Essential (primary) hypertension: Secondary | ICD-10-CM

## 2024-08-10 DIAGNOSIS — G4733 Obstructive sleep apnea (adult) (pediatric): Secondary | ICD-10-CM | POA: Diagnosis not present

## 2024-08-10 NOTE — Addendum Note (Signed)
 Addended by: JANIT GENI CROME on: 08/10/2024 09:29 AM   Modules accepted: Orders

## 2024-08-10 NOTE — Patient Instructions (Signed)
 Medication Instructions:  Your physician recommends that you continue on your current medications as directed. Please refer to the Current Medication list given to you today.  *If you need a refill on your cardiac medications before your next appointment, please call your pharmacy*  Lab Work: None.  If you have labs (blood work) drawn today and your tests are completely normal, you will receive your results only by: MyChart Message (if you have MyChart) OR A paper copy in the mail If you have any lab test that is abnormal or we need to change your treatment, we will call you to review the results.  Testing/Procedures: None.  Follow-Up: At [facility], you and your health needs are our priority.  As part of our continuing mission to provide you with exceptional heart care, our providers are all part of one team.  This team includes your primary Cardiologist (physician) and Advanced Practice Providers or APPs (Physician Assistants and Nurse Practitioners) who all work together to provide you with the care you need, when you need it.  Your next appointment:   1 year(s)  Provider:   Dr. Wilbert Bihari, MD   We recommend signing up for the patient portal called MyChart.  Sign up information is provided on this After Visit Summary.  MyChart is used to connect with patients for Virtual Visits (Telemedicine).  Patients are able to view lab/test results, encounter notes, upcoming appointments, etc.  Non-urgent messages can be sent to your provider as well.   To learn more about what you can do with MyChart, go to [email or web address].   Other Instructions Please make an appointment at our front desk to see Dr. Anner for your cardiology follow up.

## 2024-08-10 NOTE — Progress Notes (Signed)
 SLEEP MEDICINE OFFICE VISIT NOTE:   Date:  08/10/2024   ID:  Barry Schmidt, DOB 01-Feb-1961, MRN [id number] The patient was identified using 2 identifiers.  PCP:  Jodie Lavern CROME, MD  Cardiologist:  Debby Sor, MD (Inactive)    Referring MD: Jodie Lavern CROME, MD   Chief Complaint  Patient presents with   Sleep Apnea   Hypertension    History of Present Illness:    Barry Schmidt is a 63 y.o. male with a hx of obstructive sleep apnea on CPAP, diabetes mellitus, hyperlipidemia, hypertension, CAD status post CABG.  He was followed by Dr. Sor for his sleep apnea and has since retired and therefore patient has now been referred to me for his sleep apnea.  He initially had a sleep study done November 2022 found to have moderate sleep apnea with an AHI of 17.7/h with severe OSA during supine sleep with an AHI 56.2/h.  He also had mild snoring.  He also had central sleep apnea with a central index of 9.7/h.  He was titrated to CPAP at 13 cm H2O.  And eventually started on ResMed AirSense 11 AutoSet unit on April 2023 with a EPR of 3 and pressure set on auto from 12 to 16 cm H2O.  He continued to have central events on his downloads and therefore was referred for BiPAP titration which was done in November 2023 and started on BiPAP at 11 over 7 cm H2O.  He continued to have events and this was changed to auto BiPAP.  He has continued to have central events up to 16.2/h.  He ultimately had a BiPAP ASV titration April 06, 2023 and received a new ResMed air curve 11 ASV unit and was set on a minimum pressure support of 3 cm H2O and max pressure of 15 cm H2O.  He is now back for follow-up.  He is doing well with his PAP device and thinks that he has gotten used to it.  He tolerates the mask and feels the pressure is adequate.  Since going on PAP he feels rested in the am and has no significant daytime sleepiness.  He has a lot of problems with mouth dryness despite increasing the humidity.  He denies any  significant nasal dryness or nasal congestion.  He thinks the snoring has significantly improved. An Epworth Sleepiness Scale score was calculated the office today and this endorsed at 12 which could indicate some mild residual daytime sleepiness. He goes to bed at 8pm and falls asleep within 20 minutes and gets up between 5-6am.  He usually gets up once to use the bathroom.  Patient denies any episodes of bruxism, restless legs, No gagging hallucinations or cataplectic events.     Past Medical History:  Diagnosis Date   Diabetes mellitus without complication (HCC)    Hyperlipidemia    Hypertension    On statin therapy due to risk of future cardiovascular event 10/14/2018   OSA (obstructive sleep apnea) 09/16/2021   Central and obstructive: sleep study 09/2021; cpap started. May warrant bipap. Avoid supine position   S/P CABG x 3, 04/2021    Sleep apnea    Uncontrolled diabetes mellitus without complication, with long-term current use of insulin  06/23/2018   Diagnosed in 2007; was morbidly obese at that time 260 pounds. He lost weight and did well.     Past Surgical History:  Procedure Laterality Date   COLONOSCOPY  05/21/2023   [facility].  also in 2014- in [place]   per pt   CORONARY ARTERY BYPASS GRAFT N/A 04/25/2021   Procedure: CORONARY ARTERY BYPASS GRAFTING (CABG)x 3 ON CARDIOPULMONARY BYPASS USING LIMA, LEFT RADIAL ARTERY AND  GSV.;  Surgeon: Kerrin Elspeth BROCKS, MD;  Location: [facility] OR;  Service: Open Heart Surgery;  Laterality: N/A;   ENDOVEIN HARVEST OF GREATER SAPHENOUS VEIN Bilateral 04/25/2021   Procedure: ENDOVEIN HARVEST OF GREATER SAPHENOUS VEIN;  Surgeon: Kerrin Elspeth BROCKS, MD;  Location: [facility] OR;  Service: Open Heart Surgery;  Laterality: Bilateral;   LEFT HEART CATH AND CORONARY ANGIOGRAPHY N/A 04/24/2021   Procedure: LEFT HEART CATH AND CORONARY ANGIOGRAPHY;  Surgeon: Burnard Debby LABOR, MD;  Location: [facility] INVASIVE CV LAB;  Service: Cardiovascular;  Laterality: N/A;    RADIAL ARTERY HARVEST Left 04/25/2021   Procedure: RADIAL ARTERY HARVEST;  Surgeon: Kerrin Elspeth BROCKS, MD;  Location: [facility] OR;  Service: Open Heart Surgery;  Laterality: Left;   TEE WITHOUT CARDIOVERSION N/A 04/25/2021   Procedure: TRANSESOPHAGEAL ECHOCARDIOGRAM (TEE);  Surgeon: Kerrin Elspeth BROCKS, MD;  [facility];  Service: Open Heart Surgery;  Laterality: N/A;   TONSILECTOMY/ADENOIDECTOMY WITH MYRINGOTOMY  2004   Uvilaectomy.    Current Medications: Current Meds  Medication Sig   ACCU-CHEK AVIVA PLUS test strip USE AS INSTRUCTED TO TEST BLOOD GLUCOSE   acetaminophen  (TYLENOL ) 325 MG tablet Take 2 tablets (650 mg total) by mouth every 6 (six) hours as needed for mild pain.   ALPRAZolam  (XANAX ) 0.5 MG tablet Take 1 tablet (0.5 mg total) by mouth daily as needed for anxiety.   aspirin  EC 81 MG tablet Take 81 mg by mouth at bedtime.   Blood Glucose Monitoring Suppl (ONE TOUCH ULTRA 2) w/Device KIT 1 kit by Does not apply route 2 (two) times daily.   FARXIGA  10 MG TABS tablet TAKE 1 TABLET BY MOUTH EVERY DAY   furosemide  (LASIX ) 20 MG tablet TAKE 1 TABLET BY MOUTH EVERY DAY AS NEEDED   Lancets (ONETOUCH ULTRASOFT) lancets Use as instructed   metFORMIN  (GLUCOPHAGE ) 1000 MG tablet TAKE 1 TABLET (1,000 MG TOTAL) BY MOUTH TWICE A DAY WITH FOOD   metoprolol  tartrate (LOPRESSOR ) 25 MG tablet Take 1 tablet (25 mg) in the morning, and 0.5 tablet (12.5 mg) in the evening.   nitroGLYCERIN  (NITROSTAT ) 0.4 MG SL tablet Place 1 tablet (0.4 mg total) under the tongue every 5 (five) minutes as needed for chest pain.   rosuvastatin  (CRESTOR ) 40 MG tablet TAKE 1 TABLET BY MOUTH EVERYDAY AT BEDTIME   Semaglutide , 1 MG/DOSE, 4 MG/3ML SOPN Inject 1 mg into the skin once a week.   sertraline  (ZOLOFT ) 100 MG tablet Take 2 tablets (200 mg total) by mouth daily.     Allergies:   Penicillins   Social History   Socioeconomic History   Marital status: Married    Spouse name: [name]   Number of  children: 2   Years of education: Not on file   Highest education level: Associate degree: occupational, scientist, product/process development, or vocational program  Occupational History   Occupation: production designer, theatre/television/film, Airline Pilot: [employer]  Tobacco Use   Smoking status: Former    Current packs/day: 0.00    Types: Cigarettes    Quit date: 11/07/2019    Years since quitting: 4.7   Smokeless tobacco: Never  Vaping Use   Vaping status: Never Used  Substance and Sexual Activity   Alcohol use: Yes    Comment: Occastional    Drug use: Never   Sexual activity: Yes    Partners:  Female  Other Topics Concern   Not on file  Social History Narrative   Right handed    Caffeine 4 daily    Lives at home with wife ( [name]) and child.   Social Drivers of Corporate Investment Banker Strain: Low Risk  (06/01/2023)   Overall Financial Resource Strain (CARDIA)    Difficulty of Paying Living Expenses: Not very hard  Food Insecurity: No Food Insecurity (06/01/2023)   Hunger Vital Sign    Worried About Running Out of Food in the Last Year: Never true    Ran Out of Food in the Last Year: Never true  Transportation Needs: No Transportation Needs (06/01/2023)   PRAPARE - Administrator, Civil Service (Medical): No    Lack of Transportation (Non-Medical): No  Physical Activity: Sufficiently Active (06/01/2023)   Exercise Vital Sign    Days of Exercise per Week: 5 days    Minutes of Exercise per Session: 150+ min  Stress: Stress Concern Present (06/01/2023)   [employer] of Occupational Health - Occupational Stress Questionnaire    Feeling of Stress : Very much  Social Connections: Moderately Integrated (06/01/2023)   Social Connection and Isolation Panel    Frequency of Communication with Friends and Family: More than three times a week    Frequency of Social Gatherings with Friends and Family: Twice a week    Attends Religious Services: 1 to 4 times per year    Active Member of [employer] or Organizations: No     Attends Engineer, Structural: Not on file    Marital Status: Married     Family History: The patient's family history includes Colon polyps in his brother, brother, brother, father, and sister; Diabetes in his brother, brother, and sister; Diabetes type I in his father; Healthy in his son and son; Heart attack in his father; Heart disease in his mother; Hypertension in his mother. There is no history of Colon cancer, Esophageal cancer, Stomach cancer, or Rectal cancer.  ROS:   Please see the history of present illness.    ROS  All other systems reviewed and negative.   EKGs/Labs/Other Studies Reviewed:    The following studies were reviewed today: PAP compliance download     Physical Exam:    VS:  BP 104/62   Pulse 72   Ht 5' 8 (1.727 m)   Wt 204 lb 6.4 oz (92.7 kg)   SpO2 94%   BMI 31.08 kg/m     Wt Readings from Last 3 Encounters:  08/10/24 204 lb 6.4 oz (92.7 kg)  05/04/24 200 lb 9.6 oz (91 kg)  11/05/23 200 lb 3.2 oz (90.8 kg)    GEN: Well nourished, well developed in no acute distress HEENT: Normal NECK: No JVD; No carotid bruits LYMPHATICS: No lymphadenopathy CARDIAC:RRR, no murmurs, rubs, gallops RESPIRATORY:  Clear to auscultation without rales, wheezing or rhonchi  ABDOMEN: Soft, non-tender, non-distended MUSCULOSKELETAL:  No edema; No deformity  SKIN: Warm and dry NEUROLOGIC:  Alert and oriented x 3 PSYCHIATRIC:  Normal affect   ASSESSMENT:    1. OSA (obstructive sleep apnea) on ASV   2. Essential hypertension    PLAN:    In order of problems listed above:  OSA - The patient is tolerating PAP therapy well without any problems. The PAP download performed by his DME was personally reviewed and interpreted by me today and showed an AHI of 0.5 /hr on BiPAP ASV with max pressure support 15 cm  H2O, min pressure support 3 cm H2O, maximum EPAP 7 cm H2O cm H2O with 100% compliance in using more than 4 hours nightly.  The patient has been using and  benefiting from PAP use and will continue to benefit from therapy.   Hypertension - BP controlled on exam today - Continue Lopressor  25 mg every morning and 12.5 mg every afternoon with as needed refills    Time Spent: 20 minutes total time of encounter, including 15 minutes spent in face-to-face patient care on the date of this encounter. This time includes coordination of care and counseling regarding above mentioned problem list. Remainder of non-face-to-face time involved reviewing chart documents/testing relevant to the patient encounter and documentation in the medical record. I have independently reviewed documentation from referring provider  Medication Adjustments/Labs and Tests Ordered: Current medicines are reviewed at length with the patient today.  Concerns regarding medicines are outlined above.  No orders of the defined types were placed in this encounter.  No orders of the defined types were placed in this encounter.   Signed, Wilbert Bihari, MD  08/10/2024 9:17 AM    [facility]

## 2024-08-11 ENCOUNTER — Other Ambulatory Visit: Payer: Self-pay | Admitting: Family Medicine

## 2024-08-11 ENCOUNTER — Telehealth: Payer: Self-pay | Admitting: Cardiology

## 2024-08-11 ENCOUNTER — Other Ambulatory Visit: Payer: Self-pay | Admitting: Adult Health

## 2024-08-11 MED ORDER — METOPROLOL TARTRATE 25 MG PO TABS: ORAL_TABLET | ORAL | 3 refills | Status: DC

## 2024-08-11 NOTE — Telephone Encounter (Signed)
 Wife called and stated pt was a pt of Dr Thalia. She wants to be sure that since Turner only did his sleep fu, all refills will be approved to make it to his appt with Dr Wonda in Feb?    Best number [phone], [facility]

## 2024-08-15 NOTE — Telephone Encounter (Signed)
 Spoke with [name] (DPR). She is aware that Dr. Wonda has approved refills of cardiac meds until pt can establish care with him on 11/10/24. Metoprolol  has already been refilled. [name] denies need for furosemide  or nitroglycerin  refills at this time. She agrees to call if/when he needs these refilled in the future. She expressed appreciation of call and denied additional questions or concerns.

## 2024-09-13 ENCOUNTER — Ambulatory Visit: Admitting: Cardiology

## 2024-10-11 ENCOUNTER — Other Ambulatory Visit: Payer: Self-pay | Admitting: Family Medicine

## 2024-10-19 ENCOUNTER — Other Ambulatory Visit: Payer: Self-pay | Admitting: Family Medicine

## 2024-10-21 ENCOUNTER — Other Ambulatory Visit (HOSPITAL_COMMUNITY): Payer: Self-pay

## 2024-10-21 ENCOUNTER — Telehealth: Payer: Self-pay

## 2024-10-21 NOTE — Telephone Encounter (Signed)
 Pharmacy Patient Advocate Encounter   Received notification from [employer] that prior authorization for Ozempic  (1 MG/DOSE) 4MG /3ML pen-injectors is required/requested.   Insurance verification completed.   The patient is insured through [employer].   Per test claim: PA required; PA submitted to above mentioned insurance via Latent Key/confirmation #/EOC [id number] Status is pending

## 2024-10-27 ENCOUNTER — Other Ambulatory Visit (HOSPITAL_COMMUNITY): Payer: Self-pay

## 2024-10-27 NOTE — Telephone Encounter (Signed)
-  Additional documentation submitted to fax [phone]

## 2024-11-02 ENCOUNTER — Other Ambulatory Visit (HOSPITAL_COMMUNITY): Payer: Self-pay

## 2024-11-02 NOTE — Telephone Encounter (Signed)
 Pharmacy Patient Advocate Encounter  Received notification from [employer] that Prior Authorization for Ozempic  (1 MG/DOSE) 4MG /3ML pen-injectors  has been APPROVED from 09/27/24 to 10/27/25. Unable to obtain price due to refill too soon rejection, last fill date 10/11/24 next available fill date03/14/26   PA #/Case ID/Reference #: [id number]

## 2024-11-07 ENCOUNTER — Encounter: Admitting: Family Medicine

## 2024-11-08 ENCOUNTER — Encounter: Payer: Self-pay | Admitting: *Deleted

## 2024-11-10 ENCOUNTER — Encounter: Payer: Self-pay | Admitting: Cardiovascular Disease

## 2024-11-10 ENCOUNTER — Ambulatory Visit: Admitting: Cardiovascular Disease

## 2024-11-10 VITALS — BP 105/68 | HR 64 | Ht 68.0 in | Wt 207.0 lb

## 2024-11-10 DIAGNOSIS — G4733 Obstructive sleep apnea (adult) (pediatric): Secondary | ICD-10-CM

## 2024-11-10 DIAGNOSIS — E785 Hyperlipidemia, unspecified: Secondary | ICD-10-CM

## 2024-11-10 DIAGNOSIS — I10 Essential (primary) hypertension: Secondary | ICD-10-CM | POA: Diagnosis not present

## 2024-11-10 DIAGNOSIS — Z951 Presence of aortocoronary bypass graft: Secondary | ICD-10-CM

## 2024-11-10 DIAGNOSIS — I2510 Atherosclerotic heart disease of native coronary artery without angina pectoris: Secondary | ICD-10-CM

## 2024-11-10 MED ORDER — METOPROLOL TARTRATE 25 MG PO TABS: ORAL_TABLET | ORAL | 3 refills | Status: DC

## 2024-11-10 MED ORDER — LOSARTAN POTASSIUM 25 MG PO TABS: 25.0000 mg | ORAL_TABLET | Freq: Every day | ORAL | 3 refills | Status: AC

## 2024-11-10 MED ORDER — METOPROLOL TARTRATE 25 MG PO TABS: ORAL_TABLET | ORAL | 0 refills | Status: AC

## 2024-11-10 MED ORDER — ROSUVASTATIN CALCIUM 40 MG PO TABS: 40.0000 mg | ORAL_TABLET | Freq: Every day | ORAL | 3 refills | Status: AC

## 2024-11-10 NOTE — Progress Notes (Signed)
 " Cardiology Office Note:    Date:  11/10/2024   ID:  Barry Schmidt, DOB 1961-02-15, MRN [id number]  PCP:  Jodie Lavern CROME, MD   [facility] Providers Cardiologist:  Ozell Fell, MD     Referring MD: Jodie Lavern CROME, MD   Chief Complaint  Patient presents with   Coronary Artery Disease    History of Present Illness:    Barry Schmidt is a 64 y.o. male presenting for follow-up of coronary artery disease.  He has been followed by Dr. Burnard.  The patient is a former smoker, quit in 2019.  He has type 2 diabetes.  He was diagnosed with multivessel coronary artery disease and underwent coronary bypass surgery in 2022.  Comorbid conditions include hypertension, mixed hyperlipidemia, and obstructive sleep apnea.  The patient is here with his wife today.  He is doing well.  He has mild shortness of breath with walking stairs.  He otherwise is able to do all of his activities with no exertional symptoms.  He walks nearly 15,000 steps per day with his work.  Denies chest pain, chest pressure, or heart palpitations.  He is compliant with his medications.  Past Medical History:  Diagnosis Date   Diabetes mellitus without complication (HCC)    Hyperlipidemia    Hypertension    On statin therapy due to risk of future cardiovascular event 10/14/2018   OSA (obstructive sleep apnea) 09/16/2021   Central and obstructive: sleep study 09/2021; cpap started. May warrant bipap. Avoid supine position   S/P CABG x 3, 04/2021    Sleep apnea    Uncontrolled diabetes mellitus without complication, with long-term current use of insulin  06/23/2018   Diagnosed in 2007; was morbidly obese at that time 260 pounds. He lost weight and did well.    Past Surgical History:  Procedure Laterality Date   COLONOSCOPY  05/21/2023   [facility]ict[facility] Brand at [facility].  also in 2014- in [place]  per pt   CORONARY ARTERY BYPASS GRAFT N/A 04/25/2021   Procedure: CORONARY ARTERY BYPASS GRAFTING (CABG)x 3 ON CARDIOPULMONARY BYPASS  USING LIMA, LEFT RADIAL ARTERY AND  GSV.;  Surgeon: Kerrin Elspeth BROCKS, MD;  Location: [facility] OR;  Service: Open Heart Surgery;  Laterality: N/A;   ENDOVEIN HARVEST OF GREATER SAPHENOUS VEIN Bilateral 04/25/2021   Procedure: ENDOVEIN HARVEST OF GREATER SAPHENOUS VEIN;  Surgeon: Kerrin Elspeth BROCKS, MD;  Location: [facility] OR;  Service: Open Heart Surgery;  Laterality: Bilateral;   LEFT HEART CATH AND CORONARY ANGIOGRAPHY N/A 04/24/2021   Procedure: LEFT HEART CATH AND CORONARY ANGIOGRAPHY;  Surgeon: Burnard Debby LABOR, MD;  Location: [facility] INVASIVE CV LAB;  Service: Cardiovascular;  Laterality: N/A;   RADIAL ARTERY HARVEST Left 04/25/2021   Procedure: RADIAL ARTERY HARVEST;  Surgeon: Kerrin Elspeth BROCKS, MD;  Location[facility] OR;  Service: Open Heart Surgery;  Laterality: Left;   TEE WITHOUT CARDIOVERSION N/A 04/25/2021   Procedure: TRANSESOPHAGEAL ECHOCARDIOGRAM (TEE);  Surgeon: Kerrin Elspeth BROCKS, MD;  Location: [facility] OR;  Service: Open Heart Surgery;  Laterality: N/A;   TONSILECTOMY/ADENOIDECTOMY WITH MYRINGOTOMY  2004   Uvilaectomy.    Current Medications: Active Medications[1]   Allergies:   Penicillins   ROS:   Please see the history of present illness.    All other systems reviewed and are negative.  EKGs/Labs/Other Studies Reviewed:    The following studies were reviewed today: Cardiac Studies & Procedures   ______________________________________________________________________________________________ CARDIAC CATHETERIZATION  CARDIAC CATHETERIZATION 04/24/2021  Conclusion   Prox RCA lesion is 70% stenosed.  RPAV lesion is 50% stenosed.   Dist RCA lesion is 50% stenosed.   Ost LAD to Prox LAD lesion is 100% stenosed.   Mid LM to Dist LM lesion is 85% stenosed.   Ramus lesion is 80% stenosed.   Ost Cx lesion is 70% stenosed.   Mid Cx to Dist Cx lesion is 50% stenosed.   There is moderate left ventricular systolic dysfunction.   LV end diastolic pressure is normal.  Severe  multivessel CAD with 85% on distal left main stenosis; total ostial occlusion of a large LAD; 80% ostial ramus intermediate stenosis and 70% ostial circumflex stenosis.  There is 50% narrowing in the mid left circumflex after marginal branch.  There is significant dampening with each injection into the RCA.  There is 70% proximal RCA stenoses with 50% irregularity distally.  There is extensive collateralization from the distal RCA via septal collaterals supplying the LAD.  RECOMMENDATION: Patient will be admitted with plans for surgical consultation for CABG revascularization surgery due to high-grade left main stenosis.  Findings Coronary Findings Diagnostic  Dominance: Right  Left Main Mid LM to Dist LM lesion is 85% stenosed.  Left Anterior Descending Collaterals Mid LAD filled by collaterals from 1st RPL.  Collaterals Dist LAD filled by collaterals from RPDA.  Ost LAD to Prox LAD lesion is 100% stenosed.  Second Septal Branch Collaterals 2nd Sept filled by collaterals from 3rd RPL.  Ramus Intermedius Ramus lesion is 80% stenosed.  Left Circumflex Ost Cx lesion is 70% stenosed. Mid Cx to Dist Cx lesion is 50% stenosed.  Right Coronary Artery Prox RCA lesion is 70% stenosed. Dist RCA lesion is 50% stenosed.  Right Posterior Atrioventricular Artery RPAV lesion is 50% stenosed.  Intervention  No interventions have been documented.     ECHOCARDIOGRAM  ECHOCARDIOGRAM COMPLETE 11/26/2022  Narrative ECHOCARDIOGRAM REPORT    Patient Name:   Barry Schmidt Date of Exam: 11/26/2022 Medical Rec #:  [id number]     Height:       68.0 in Accession #:    [id number]    Weight:       207.0 lb Date of Birth:  [birth year]     BSA:          2.074 m Patient Age:    64 years      BP:           118/70 mmHg Patient Gender: M             HR:           58 bpm. Exam Location:  [place]  Procedure: 2D Echo, Cardiac Doppler, Color Doppler, 3D Echo and Strain Analysis  Indications:     Coronary artery disease involving native coronary artery of native heart without angina pectoris [I25.10 (ICD-10-CM)]; Shortness of breath [R06.  History:        Patient has prior history of Echocardiogram examinations, most recent 04/25/2021. CAD, Prior CABG, Arrythmias:Bradycardia, Signs/Symptoms:Shortness of Breath; Risk Factors:Hypertension, Dyslipidemia, Former Smoker and Sleep Apnea.  Sonographer:    Alan Greenhouse RDMS, RVT, RDCS Referring Phys: [id number] THOMAS A KELLY  IMPRESSIONS   1. Left ventricular ejection fraction, by estimation, is 60 to 65%. The left ventricle has normal function. The left ventricle has no regional wall motion abnormalities. Left ventricular diastolic parameters were normal. The average left ventricular global longitudinal strain is -26.7 %. The global longitudinal strain is normal. 2. Right ventricular systolic function is normal. The right ventricular size is normal. 3. The mitral valve  is normal in structure. No evidence of mitral valve regurgitation. No evidence of mitral stenosis. 4. The aortic valve is tricuspid. Aortic valve regurgitation is not visualized. No aortic stenosis is present. 5. The inferior vena cava is normal in size with greater than 50% respiratory variability, suggesting right atrial pressure of 3 mmHg.  Comparison(s): Echocardiogram done 04/25/21 showed and EF of 55-60%.  FINDINGS Left Ventricle: Left ventricular ejection fraction, by estimation, is 60 to 65%. The left ventricle has normal function. The left ventricle has no regional wall motion abnormalities. The average left ventricular global longitudinal strain is -26.7 %. The global longitudinal strain is normal. The left ventricular internal cavity size was normal in size. There is no left ventricular hypertrophy. Left ventricular diastolic parameters were normal.  Right Ventricle: The right ventricular size is normal. Right vetricular wall thickness was not well visualized. Right  ventricular systolic function is normal.  Left Atrium: Left atrial size was normal in size.  Right Atrium: Right atrial size was normal in size.  Pericardium: There is no evidence of pericardial effusion.  Mitral Valve: The mitral valve is normal in structure. No evidence of mitral valve regurgitation. No evidence of mitral valve stenosis.  Tricuspid Valve: The tricuspid valve is normal in structure. Tricuspid valve regurgitation is trivial. No evidence of tricuspid stenosis.  Aortic Valve: The aortic valve is tricuspid. Aortic valve regurgitation is not visualized. No aortic stenosis is present. Aortic valve mean gradient measures 5.0 mmHg. Aortic valve peak gradient measures 9.7 mmHg. Aortic valve area, by VTI measures 1.93 cm.  Pulmonic Valve: The pulmonic valve was not well visualized. Pulmonic valve regurgitation is trivial. No evidence of pulmonic stenosis.  Aorta: The aortic root and ascending aorta are structurally normal, with no evidence of dilitation.  Venous: The inferior vena cava is normal in size with greater than 50% respiratory variability, suggesting right atrial pressure of 3 mmHg.  IAS/Shunts: No atrial level shunt detected by color flow Doppler.   LEFT VENTRICLE PLAX 2D LVIDd:         5.10 cm     Diastology LVIDs:         2.70 cm     LV e' medial:    6.20 cm/s LV PW:         1.00 cm     LV E/e' medial:  17.7 LV IVS:        0.90 cm     LV e' lateral:   11.40 cm/s LVOT diam:     1.90 cm     LV E/e' lateral: 9.6 LV SV:         75 LV SV Index:   36          2D Longitudinal Strain LVOT Area:     2.84 cm    2D Strain GLS (A2C):   -26.6 % 2D Strain GLS (A3C):   -26.4 % 2D Strain GLS (A4C):   -27.1 % LV Volumes (MOD)           2D Strain GLS Avg:     -26.7 % LV vol d, MOD A2C: 80.9 ml LV vol d, MOD A4C: 87.9 ml LV vol s, MOD A2C: 32.9 ml LV vol s, MOD A4C: 34.4 ml LV SV MOD A2C:     48.0 ml LV SV MOD A4C:     87.9 ml LV SV MOD BP:      52.2 ml  RIGHT  VENTRICLE RV S prime:     11.40 cm/s  TAPSE (M-mode): 2.1 cm  LEFT ATRIUM             Index        RIGHT ATRIUM           Index LA diam:        4.20 cm 2.03 cm/m   RA Area:     11.50 cm LA Vol (A2C):   39.7 ml 19.14 ml/m  RA Volume:   25.20 ml  12.15 ml/m LA Vol (A4C):   62.8 ml 30.28 ml/m LA Biplane Vol: 51.7 ml 24.93 ml/m AORTIC VALVE AV Area (Vmax):    2.04 cm AV Area (Vmean):   2.00 cm AV Area (VTI):     1.93 cm AV Vmax:           156.00 cm/s AV Vmean:          106.000 cm/s AV VTI:            0.387 m AV Peak Grad:      9.7 mmHg AV Mean Grad:      5.0 mmHg LVOT Vmax:         112.00 cm/s LVOT Vmean:        74.800 cm/s LVOT VTI:          0.263 m LVOT/AV VTI ratio: 0.68  AORTA Ao Root diam: 3.10 cm Ao Asc diam:  3.10 cm  MITRAL VALVE                TRICUSPID VALVE MV Area (PHT): 3.54 cm     TR Peak grad:   18.7 mmHg MV Decel Time: 214 msec     TR Vmax:        216.00 cm/s MR Peak grad: 32.9 mmHg MR Vmax:      287.00 cm/s   SHUNTS MV E velocity: 110.00 cm/s  Systemic VTI:  0.26 m MV A velocity: 88.20 cm/s   Systemic Diam: 1.90 cm MV E/A ratio:  1.25  Dorn Ross MD Electronically signed by Dorn Ross MD Signature Date/Time: 11/27/2022/8:46:41 AM    Final   TEE  ECHO INTRAOPERATIVE TEE 04/25/2021  Narrative *INTRAOPERATIVE TRANSESOPHAGEAL REPORT *    Patient Name:   Barry Schmidt Date of Exam: 04/25/2021 Medical Rec #:  [id number]     Height:       68.0 in Accession #:    [id number]    Weight:       214.5 lb Date of Birth:  1961-02-17     BSA:          2.11 m Patient Age:    60 years      BP:           126/58 mmHg Patient Gender: M             HR:           71 bpm. Exam Location:  Inpatient  Transesophogeal exam was perform intraoperatively during surgical procedure. Patient was closely monitored under general anesthesia during the entirety of examination.  Indications:     CABG Sonographer:     Vernell Hammersmith RDCS Performing Phys: Franky Bald MD Diagnosing Phys: Franky Bald MD  Complications: No known complications during this procedure. POST-OP IMPRESSIONS - Left Ventricle: LVEF unchanged, no RWMA's, CO > 4 L/min, CI > 2 L/min/m. - Right Ventricle: The right ventricle appears unchanged from pre-bypass. - Aorta: Aorta unchanged, no obvious dissection noted after cannula. - Aortic Valve: The aortic valve appears unchanged from pre-bypass. -  Mitral Valve: The mitral valve appears unchanged from pre-bypass. - Tricuspid Valve: The tricuspid valve appears unchanged from pre-bypass.  PRE-OP FINDINGS Left Ventricle: The left ventricle has normal systolic function, with an ejection fraction of 55-60%. The cavity size was normal. There is no increase in left ventricular wall thickness. No evidence of left ventricular regional wall motion abnormalities. There is no left ventricular hypertrophy.   Right Ventricle: The right ventricle has normal systolic function. The cavity was normal. There is no increase in right ventricular wall thickness. Right ventricular systolic pressure is normal. There is no aneurysm seen. PA catheter traversing the RV into the main PA.  Left Atrium: Left atrial size was normal in size. No left atrial/left atrial appendage thrombus was detected. Left atrial appendage velocity is normal at greater than 40 cm/s.  Right Atrium: Right atrial size was normal in size. PA catheter traversing the RA.  Interatrial Septum: No atrial level shunt detected by color flow Doppler. There is no evidence of a patent foramen ovale.  Pericardium: There is no evidence of pericardial effusion. There is no pleural effusion.  Mitral Valve: The mitral valve is normal in structure. Mitral valve regurgitation is trivial by color flow Doppler. The MR jet is centrally-directed. There is no evidence of mitral valve vegetation. There is no evidence of mitral stenosis.  Tricuspid Valve: The tricuspid valve was normal in structure.  Tricuspid valve regurgitation is trivial by color flow Doppler. No evidence of tricuspid stenosis is present. There is no evidence of tricuspid valve vegetation.  Aortic Valve: The aortic valve is tricuspid aortic valve regurgitation was not visualized by color flow Doppler. There is no stenosis of the aortic valve. There is no evidence of aortic valve vegetation.   Pulmonic Valve: The pulmonic valve was normal in structure. Pulmonic valve regurgitation is trivial by color flow Doppler.   Aorta: The aortic root, ascending aorta and aortic arch are normal in size and structure. There is evidence of plaque is layered in the descending aorta; Grade I, measuring 1-69mm in size.  Pulmonary Artery: Norva Purl catheter present on the right. The pulmonary artery is of normal size.  Venous: The inferior vena cava was not well visualized.  Shunts: There is no evidence of an atrial septal defect.  +--------------+-------++ LEFT VENTRICLE        +--------------+-------++ PLAX 2D               +--------------+-------++ LVIDd:        4.55 cm +--------------+-------++ LVIDs:        3.20 cm +--------------+-------++ LV SV:        54 ml   +--------------+-------++ LV SV Index:  24.58   +--------------+-------++                       +--------------+-------++  +-------------+-----------++ AORTIC VALVE             +-------------+-----------++ AV Vmax:     126.00 cm/s +-------------+-----------++ AV Vmean:    71.600 cm/s +-------------+-----------++ AV VTI:      0.221 m     +-------------+-----------++ AV Peak Grad:6.4 mmHg    +-------------+-----------++ AV Mean Grad:3.0 mmHg    +-------------+-----------++   Franky Bald MD Electronically signed by Franky Bald MD Signature Date/Time: 04/25/2021/2:15:17 PM    Final        ______________________________________________________________________________________________       EKG:   EKG Interpretation Date/Time:  Thursday November 10 2024 10:01:58 EST Ventricular Rate:  64 PR  Interval:  172 QRS Duration:  96 QT Interval:  442 QTC Calculation: 455 R Axis:   -44  Text Interpretation: Normal sinus rhythm Left axis deviation Low voltage QRS Incomplete right bundle branch block Cannot rule out Anterior infarct , age undetermined When compared with ECG of 30-Jul-2023 15:26, No significant change was found Confirmed by Wonda Sharper [phone]) on 11/10/2024 10:13:28 AM    Recent Labs: No results found for requested labs within last 365 days.  Recent Lipid Panel    Component Value Date/Time   CHOL 81 11/05/2023 0849   TRIG 73.0 11/05/2023 0849   HDL 46.50 11/05/2023 0849   CHOLHDL 2 11/05/2023 0849   VLDL 14.6 11/05/2023 0849   LDLCALC 20 11/05/2023 0849   LDLDIRECT 90.0 06/23/2018 1113     Risk Assessment/Calculations:                Physical Exam:    VS:  BP 105/68 (BP Location: Right Arm)   Pulse 64   Ht 5' 8 (1.727 m)   Wt 207 lb (93.9 kg)   SpO2 95%   BMI 31.47 kg/m     Wt Readings from Last 3 Encounters:  11/10/24 207 lb (93.9 kg)  08/10/24 204 lb 6.4 oz (92.7 kg)  05/04/24 200 lb 9.6 oz (91 kg)     GEN:  Well nourished, well developed in no acute distress HEENT: Normal NECK: No JVD; No carotid bruits LYMPHATICS: No lymphadenopathy CARDIAC: RRR, no murmurs, rubs, gallops RESPIRATORY:  Clear to auscultation without rales, wheezing or rhonchi  ABDOMEN: Soft, non-tender, non-distended MUSCULOSKELETAL:  No edema; No deformity  SKIN: Warm and dry NEUROLOGIC:  Alert and oriented x 3 PSYCHIATRIC:  Normal affect   Assessment & Plan OSA (obstructive sleep apnea) Not followed by Dr. Shlomo Essential hypertension Blood pressure controlled, but occasionally reports low blood pressure episodes with associated lightheadedness.  Made the following recommendations today: Wean off of metoprolol  by taking 25 mg every other day for 1 week,  then stop.  Start losartan  25 mg daily in the setting of CAD and diabetes for long-term risk reduction.  Check metabolic panel in 2 weeks for losartan  monitoring. Coronary artery disease involving native coronary artery of native heart without angina pectoris Stable with no symptoms of angina.  Advised him to reduce aspirin  81 mg to once daily (currently taking twice daily).  Otherwise continue current management. S/P CABG x 3 Stable without angina.  Patient 3 years out from multivessel CABG. Hyperlipidemia with target LDL less than 70 Treated with rosuvastatin  40 mg daily.  Lipids are excellent with LDL at goal of less than 55 mg/dL.  Reviewed his labs today.  He will have his annual wellness again in a few weeks with Dr. Jodie.            Medication Adjustments/Labs and Tests Ordered: Current medicines are reviewed at length with the patient today.  Concerns regarding medicines are outlined above.  Orders Placed This Encounter  Procedures   Basic metabolic panel with GFR   EKG [id number]   Meds ordered this encounter  Medications   DISCONTD: metoprolol  tartrate (LOPRESSOR ) 25 MG tablet    Sig: Take 1 tablet (25 mg) in the morning, and 0.5 tablet (12.5 mg) in the evening.    Dispense:  135 tablet    Refill:  3    Please keep pending appt on 2/5 with Dr Wonda for continued refills. Thank you   rosuvastatin  (CRESTOR ) 40 MG tablet  Sig: Take 1 tablet (40 mg total) by mouth daily.    Dispense:  90 tablet    Refill:  3    RX IS OUT OF REFILLS   metoprolol  tartrate (LOPRESSOR ) 25 MG tablet    Sig: Take 1 tablet (25 mg) every other day for 7 days. Then stop completely.    Dispense:  10 tablet    Refill:  0    Please keep pending appt on 2/5 with Dr Wonda for continued refills. Thank you   losartan  (COZAAR ) 25 MG tablet    Sig: Take 1 tablet (25 mg total) by mouth daily.    Dispense:  30 tablet    Refill:  3    Patient Instructions  Medication Instructions:  DECREASE Aspirin  81  mg to once daily   CHANGE Metoprolol  25 mg to once every other day for 1 WEEK. Then, stop completely.   START Losartan  25 mg once daily ONCE YOU HAVE STOPPED Metoprolol    *If you need a refill on your cardiac medications before your next appointment, please call your pharmacy*  Lab Work: To be completed in 2 weeks (approximately 11/24/24): BMP  If you have labs (blood work) drawn today and your tests are completely normal, you will receive your results only by: MyChart Message (if you have MyChart) OR A paper copy in the mail If you have any lab test that is abnormal or we need to change your treatment, we will call you to review the results.  Testing/Procedures: None ordered today.  Follow-Up: At [facility], you and your health needs are our priority.  As part of our continuing mission to provide you with exceptional heart care, our providers are all part of one team.  This team includes your primary Cardiologist (physician) and Advanced Practice Providers or APPs (Physician Assistants and Nurse Practitioners) who all work together to provide you with the care you need, when you need it.  Your next appointment:   1 year(s)  Provider:   Ozell Wonda, MD     Signed, Ozell Wonda, MD  11/10/2024 1:31 PM    [facility]     [1]  Current Meds  Medication Sig   ACCU-CHEK AVIVA PLUS test strip USE AS INSTRUCTED TO TEST BLOOD GLUCOSE   acetaminophen  (TYLENOL ) 325 MG tablet Take 2 tablets (650 mg total) by mouth every 6 (six) hours as needed for mild pain.   ALPRAZolam  (XANAX ) 0.5 MG tablet Take 1 tablet (0.5 mg total) by mouth daily as needed for anxiety.   aspirin  EC 81 MG tablet Take 81 mg by mouth at bedtime.   Blood Glucose Monitoring Suppl (ONE TOUCH ULTRA 2) w/Device KIT 1 kit by Does not apply route 2 (two) times daily.   FARXIGA  10 MG TABS tablet TAKE 1 TABLET BY MOUTH EVERY DAY   furosemide  (LASIX ) 20 MG tablet TAKE 1 TABLET BY MOUTH EVERY DAY AS  NEEDED   Lancets (ONETOUCH ULTRASOFT) lancets Use as instructed   losartan  (COZAAR ) 25 MG tablet Take 1 tablet (25 mg total) by mouth daily.   metFORMIN  (GLUCOPHAGE ) 1000 MG tablet TAKE 1 TABLET (1,000 MG TOTAL) BY MOUTH TWICE A DAY WITH FOOD   nitroGLYCERIN  (NITROSTAT ) 0.4 MG SL tablet Place 1 tablet (0.4 mg total) under the tongue every 5 (five) minutes as needed for chest pain.   Semaglutide , 1 MG/DOSE, (OZEMPIC , 1 MG/DOSE,) 4 MG/3ML SOPN INJECT 1MG  INTO THE SKIN ONCE A WEEK   sertraline  (ZOLOFT ) 100 MG tablet  TAKE 1.5 TABLETS (150 MG TOTAL) BY MOUTH DAILY FOR 7 DAYS, THEN 2 TABLETS (200 MG TOTAL) DAILY.   [DISCONTINUED] metoprolol  tartrate (LOPRESSOR ) 25 MG tablet Take 1 tablet (25 mg) in the morning, and 0.5 tablet (12.5 mg) in the evening.   [DISCONTINUED] rosuvastatin  (CRESTOR ) 40 MG tablet TAKE 1 TABLET BY MOUTH EVERYDAY AT BEDTIME   "

## 2024-11-10 NOTE — Assessment & Plan Note (Addendum)
 Stable with no symptoms of angina.  Advised him to reduce aspirin  81 mg to once daily (currently taking twice daily).  Otherwise continue current management.

## 2024-11-10 NOTE — Assessment & Plan Note (Addendum)
 Stable without angina.  Patient 3 years out from multivessel CABG.

## 2024-11-10 NOTE — Patient Instructions (Addendum)
 Medication Instructions:  DECREASE Aspirin  81 mg to once daily   CHANGE Metoprolol  25 mg to once every other day for 1 WEEK. Then, stop completely.   START Losartan  25 mg once daily ONCE YOU HAVE STOPPED Metoprolol    *If you need a refill on your cardiac medications before your next appointment, please call your pharmacy*  Lab Work: To be completed in 2 weeks (approximately 11/24/24): BMP  If you have labs (blood work) drawn today and your tests are completely normal, you will receive your results only by: MyChart Message (if you have MyChart) OR A paper copy in the mail If you have any lab test that is abnormal or we need to change your treatment, we will call you to review the results.  Testing/Procedures: None ordered today.  Follow-Up: At [facility], you and your health needs are our priority.  As part of our continuing mission to provide you with exceptional heart care, our providers are all part of one team.  This team includes your primary Cardiologist (physician) and Advanced Practice Providers or APPs (Physician Assistants and Nurse Practitioners) who all work together to provide you with the care you need, when you need it.  Your next appointment:   1 year(s)  Provider:   Ozell Fell, MD

## 2024-11-24 ENCOUNTER — Encounter: Admitting: Family Medicine
